# Patient Record
Sex: Male | Born: 1940 | Race: White | Hispanic: No | Marital: Married | Smoking: Former smoker
Health system: Southern US, Community
[De-identification: ages and names within clinical notes are randomized; demographics above are authoritative.]

## PROBLEM LIST (undated history)

## (undated) DIAGNOSIS — M5116 Intervertebral disc disorders with radiculopathy, lumbar region: Secondary | ICD-10-CM

## (undated) DIAGNOSIS — R7303 Prediabetes: Secondary | ICD-10-CM

## (undated) DIAGNOSIS — I10 Essential (primary) hypertension: Secondary | ICD-10-CM

## (undated) DIAGNOSIS — M48061 Spinal stenosis, lumbar region without neurogenic claudication: Secondary | ICD-10-CM

## (undated) DIAGNOSIS — M1990 Unspecified osteoarthritis, unspecified site: Secondary | ICD-10-CM

## (undated) DIAGNOSIS — Z87442 Personal history of urinary calculi: Secondary | ICD-10-CM

## (undated) DIAGNOSIS — R51 Headache: Secondary | ICD-10-CM

## (undated) DIAGNOSIS — K219 Gastro-esophageal reflux disease without esophagitis: Secondary | ICD-10-CM

## (undated) DIAGNOSIS — E785 Hyperlipidemia, unspecified: Secondary | ICD-10-CM

## (undated) DIAGNOSIS — M5415 Radiculopathy, thoracolumbar region: Secondary | ICD-10-CM

## (undated) DIAGNOSIS — G43009 Migraine without aura, not intractable, without status migrainosus: Secondary | ICD-10-CM

## (undated) DIAGNOSIS — N644 Mastodynia: Secondary | ICD-10-CM

## (undated) DIAGNOSIS — T7840XA Allergy, unspecified, initial encounter: Secondary | ICD-10-CM

## (undated) DIAGNOSIS — R011 Cardiac murmur, unspecified: Secondary | ICD-10-CM

## (undated) DIAGNOSIS — K760 Fatty (change of) liver, not elsewhere classified: Secondary | ICD-10-CM

## (undated) DIAGNOSIS — H269 Unspecified cataract: Secondary | ICD-10-CM

## (undated) DIAGNOSIS — Z85828 Personal history of other malignant neoplasm of skin: Secondary | ICD-10-CM

## (undated) DIAGNOSIS — R519 Headache, unspecified: Secondary | ICD-10-CM

## (undated) DIAGNOSIS — I2699 Other pulmonary embolism without acute cor pulmonale: Secondary | ICD-10-CM

## (undated) HISTORY — PX: THROAT SURGERY: SHX803

## (undated) HISTORY — DX: Headache, unspecified: R51.9

## (undated) HISTORY — DX: Headache: R51

## (undated) HISTORY — DX: Personal history of other malignant neoplasm of skin: Z85.828

## (undated) HISTORY — DX: Mastodynia: N64.4

## (undated) HISTORY — PX: JOINT REPLACEMENT: SHX530

## (undated) HISTORY — DX: Essential (primary) hypertension: I10

## (undated) HISTORY — DX: Fatty (change of) liver, not elsewhere classified: K76.0

## (undated) HISTORY — DX: Intervertebral disc disorders with radiculopathy, lumbar region: M51.16

## (undated) HISTORY — PX: OTHER SURGICAL HISTORY: SHX169

## (undated) HISTORY — DX: Migraine without aura, not intractable, without status migrainosus: G43.009

## (undated) HISTORY — DX: Cardiac murmur, unspecified: R01.1

## (undated) HISTORY — DX: Radiculopathy, thoracolumbar region: M54.15

## (undated) HISTORY — DX: Allergy, unspecified, initial encounter: T78.40XA

## (undated) HISTORY — PX: SPINE SURGERY: SHX786

## (undated) HISTORY — DX: Unspecified cataract: H26.9

## (undated) HISTORY — DX: Hyperlipidemia, unspecified: E78.5

---

## 1993-12-07 HISTORY — PX: NECK SURGERY: SHX720

## 1997-12-07 HISTORY — PX: CHOLECYSTECTOMY: SHX55

## 2003-12-08 HISTORY — PX: SHOULDER SURGERY: SHX246

## 2014-12-07 HISTORY — PX: GALLBLADDER SURGERY: SHX652

## 2014-12-07 HISTORY — PX: SHOULDER SURGERY: SHX246

## 2016-08-12 ENCOUNTER — Other Ambulatory Visit: Payer: Self-pay

## 2016-11-03 ENCOUNTER — Encounter: Payer: Self-pay | Admitting: Neurology

## 2016-11-03 ENCOUNTER — Telehealth: Payer: Self-pay | Admitting: Neurology

## 2016-11-03 ENCOUNTER — Ambulatory Visit (INDEPENDENT_AMBULATORY_CARE_PROVIDER_SITE_OTHER): Payer: Medicare Other | Admitting: Neurology

## 2016-11-03 VITALS — BP 132/76 | HR 65 | Ht 72.0 in | Wt 222.0 lb

## 2016-11-03 DIAGNOSIS — G4489 Other headache syndrome: Secondary | ICD-10-CM

## 2016-11-03 DIAGNOSIS — G44221 Chronic tension-type headache, intractable: Secondary | ICD-10-CM | POA: Diagnosis not present

## 2016-11-03 NOTE — Telephone Encounter (Signed)
Noted. Dr Lucia GaskinsAhern are you okay with this?

## 2016-11-03 NOTE — Telephone Encounter (Signed)
Sure. thanks 

## 2016-11-03 NOTE — Progress Notes (Addendum)
[facility]    Provider:  Dr Jaynee Eagles Referring Provider: [name], MD Primary Care Physician:  [name], MD  CC:  Headaches  HPI:  Jesse [name]. is a 75 y.o. male here as a referral from Dr. Tobie Poet for headaches. PMHx hld, htn, ED, pre-diabetes, fatty liver,  He has had headaches for 40+ years. He denies that they have recently changed, here with his wife who also provides much information. He has lived with headaches all his life.No vision changes, jaw claudication, neck stiffness or other associated symptoms. They thought it was allergies and sinuses in the past and he has been treated for this in the past. The last time he had a headache there was no associated sinus issue. Wife is here and provides information.Pain is just on the right side on the forehead and around the eye but it can spread. Not throbbing, not pounding, more pressure. It is "just there". Pressing on it makes it better, sitting in a chair and not moving helps, can hurt but no light sensitivity and can look at his phone. Worse after mowing the lawn. No light, sound, smell sensitivity. No nausea or vomiting. Headaches are continuous all day long for years continuous. Pain can be very dull or it can be severe and "just hurts". He takes tylenol and ibuprofen every day. Sometimes he takes OTC meds the morning and at dinner, multiple times a day. Most days he will take it every night and some mornings. He did not start topamax and not the steroids he was given. No inciting events, no other associated symptoms. He has been taking daily OTC medications for years. Continuous headaches correspond to daily OTC med use.   Reviewed notes, labs and imaging from outside physicians, which showed:  BUN 11, Creatinine 0.820 09/10/2016  Reviewed notes from pcp, patients reported headache started 3 months ago, bilateral frontal and behind the eyes, no radiation, severe, throbbing, dull and "sinus pressure" It improved  with sleep, taking antibiotics since September along with ibuprofen and tylenol daily which help. Toradol has been administered. Topamax and prednisone were prescribed for him. Sed rate was ordered.   Review of Systems: Patient complains of symptoms per HPI as well as the following symptoms: No CP, no SOB. Pertinent negatives per HPI. All others negative.   Social History   Social History  . Marital status: Married    Spouse name: [name]  . Number of children: 4  . Years of education: 12+   Occupational History  . Retired    Social History Main Topics  . Smoking status: Former Research scientist (life sciences)  . Smokeless tobacco: Never Used  . Alcohol use No  . Drug use: No  . Sexual activity: Not on file   Other Topics Concern  . Not on file   Social History Narrative   Lives with wife   Caffeine use: Drinks decaf coffee and tea    Family History  Problem Relation Age of Onset  . Hypertension Mother   . Cancer Father   . Diabetes Brother   . Migraines Neg Hx     Past Medical History:  Diagnosis Date  . Hypertension     Past Surgical History:  Procedure Laterality Date  . CHOLECYSTECTOMY  1999  . gallstones  2016  . NECK SURGERY  1995   Ruptured disc repair  . SHOULDER SURGERY Left 2005  . SHOULDER SURGERY Right 2016    Current Outpatient Prescriptions  Medication Sig Dispense Refill  . diltiazem (  CARDIZEM CD) 300 MG 24 hr capsule Take 300 mg by mouth daily.    Marland Kitchen lisinopril-hydrochlorothiazide (PRINZIDE,ZESTORETIC) 20-25 MG tablet Take 1 tablet by mouth daily.    Marland Kitchen omeprazole (PRILOSEC) 20 MG capsule Take 20 mg by mouth daily.     No current facility-administered medications for this visit.     Allergies as of 11/03/2016  . (No Known Allergies)    Vitals: BP 132/76 (BP Location: Right Arm, Patient Position: Sitting, Cuff Size: Normal)   Pulse 65   Ht 6' (1.829 m)   Wt 222 lb (100.7 kg)   BMI 30.11 kg/m  Last Weight:  Wt Readings from Last 1 Encounters:  11/03/16 222  lb (100.7 kg)   Last Height:   Ht Readings from Last 1 Encounters:  11/03/16 6' (1.829 m)    Physical exam: Exam: Gen: NAD, conversant, well nourised, obese, well groomed                     CV: RRR, no MRG. No Carotid Bruits. No peripheral edema, warm, nontender Eyes: Conjunctivae clear without exudates or hemorrhage  Neuro: Detailed Neurologic Exam  Speech:    Speech is normal; fluent and spontaneous with normal comprehension.  Cognition:    The patient is oriented to person, place, and time;     recent and remote memory intact;     language fluent;     normal attention, concentration,     fund of knowledge Cranial Nerves:    The pupils are equal, round, and reactive to light. Attempted fundoscopic exam could not visualize due to small pupils. . Visual fields are full to finger confrontation. Extraocular movements are intact. Trigeminal sensation is intact and the muscles of mastication are normal. The face is symmetric. The palate elevates in the midline. Hearing intact. Voice is normal. Shoulder shrug is normal. The tongue has normal motion without fasciculations.   Coordination:    No dysmetria  Gait:    Heel-toe and tandem gait are normal.   Motor Observation:    No asymmetry, no atrophy, and no involuntary movements noted. Tone:    Normal muscle tone.    Posture:    Posture is normal. normal erect    Strength:    Strength is V/V in the upper and lower limbs.      Sensation: intact to LT     Reflex Exam:  DTR's: Ansebt AJs otherwsie     Deep tendon reflexes in the upper and lower extremities are trace bilaterally.   Toes:    The toes are downgoing bilaterally.   Clonus:    Clonus is absent.      Assessment/Plan: Chronic daily headaches may be due to medication overuse. He has daily chronic headaches for over 40 years and takes daily tylenol and ibuprofen often multiple times a day.   I had a long discussion with patient that his daily use of ibuprofen  or Tylenol can cause medication overuse/rebound headache which may be causing chronic daily headaches. They only thing to do is to stop the medication unfortunately and see if this is the case. In the timeframe after stopping his headaches may get much worse. If this is rebound, he should significantly  improve with his chronic daily headaches after 2-4 weeks of being off daily over-the-counter medications. Do not use these medications more than 2 times in a week. I will see him very soon in followup, if headaches persist will evaluate for further treatment.  Remember  to drink plenty of fluid, eat healthy meals and do not skip any meals. Try to eat protein with a every meal and eat a healthy snack such as fruit or nuts in between meals. Try to keep a regular sleep-wake schedule and try to exercise daily, particularly in the form of walking, 20-30 minutes a day, if you can.   As far as your medications are concerned, I would like to suggest: - Stop daily over the counter med use (Tylenol, excedrin, ibuprofen, alleve) Do not tak emore than 2-3x in one week, provided literature on medication rebound/overuse - Start the prednisone you were prescribed. You can hold off on starting the topamax at this time - follow up in 6 weeks to see results, if headaches persist will evaluate for further treatment. - I highly recommended MRI brain, he initially agreed then called back to say he declines MRI - esr was drawn at pcp, will request result, also said he had a CT scan will request results  (Addendum report received from [facility] 10/02/2016: report states no acute intracranial abnormality. No other details on the brain parenchyma unfortunately specifically no mention of brain volume, white matter changes etc. Labs 09/20/15 CBC unremarkable, cmp also unremarkable bun 13, creat .7,hgba1c 5.7)  Discussed; To prevent or relieve headaches, try the following:  Cool Compress. Lie down and place a cool  compress on your head.   Avoid headache triggers. If certain foods or odors seem to have triggered your migraines in the past, avoid them. A headache diary might help you identify triggers.   Include physical activity in your daily routine. Try a daily walk or other moderate aerobic exercise.   Manage stress. Find healthy ways to cope with the stressors, such as delegating tasks on your to-do list.   Practice relaxation techniques. Try deep breathing, yoga, massage and visualization.   Eat regularly. Eating regularly scheduled meals and maintaining a healthy diet might help prevent headaches. Also, drink plenty of fluids.   Follow a regular sleep schedule. Sleep deprivation might contribute to headaches  Consider biofeedback. With this mind-body technique, you learn to control certain bodily functions - such as muscle tension, heart rate and blood pressure - to prevent headaches or reduce headache pain.    Proceed to emergency room if you experience new or worsening symptoms or symptoms do not resolve, if you have new neurologic symptoms or if headache is severe, or for any concerning symptom.  Cc: [name], MD   Sarina Ill, MD  [facility] [address] [place], [place] [address]  Phone [phone] Fax [phone]

## 2016-11-03 NOTE — Telephone Encounter (Signed)
Noted, thank you

## 2016-11-03 NOTE — Telephone Encounter (Signed)
Pt wants to wait until after 4-6 wk appt with Lucia GaskinsAhern to decide when to have mri due to ins change. cb

## 2016-11-03 NOTE — Patient Instructions (Signed)
Remember to drink plenty of fluid, eat healthy meals and do not skip any meals. Try to eat protein with a every meal and eat a healthy snack such as fruit or nuts in between meals. Try to keep a regular sleep-wake schedule and try to exercise daily, particularly in the form of walking, 20-30 minutes a day, if you can.   As far as your medications are concerned, I would like to suggest: Stop the daily tylenol and ibuprofen  As far as diagnostic testing: MRI of the brain  I would like to see you back in 4-6 weeks, sooner if we need to. Please call us with any interim questions, concerns, problems, updates or refill requests.   Our phone number is [phone]. We also have an after hours call service for urgent matters and there is a physician on-call for urgent questions. For any emergencies you know to call 911 or go to the nearest emergency room

## 2016-11-04 ENCOUNTER — Telehealth: Payer: Self-pay | Admitting: *Deleted

## 2016-11-04 ENCOUNTER — Encounter: Payer: Self-pay | Admitting: Neurology

## 2016-11-04 DIAGNOSIS — R51 Headache: Secondary | ICD-10-CM

## 2016-11-04 DIAGNOSIS — R519 Headache, unspecified: Secondary | ICD-10-CM | POA: Insufficient documentation

## 2016-11-04 NOTE — Telephone Encounter (Addendum)
Pt labs, imaging reports on LindseyEmma desk.

## 2016-12-15 ENCOUNTER — Ambulatory Visit: Payer: Medicare Other | Admitting: Neurology

## 2017-01-19 ENCOUNTER — Encounter: Payer: Self-pay | Admitting: Neurology

## 2017-01-19 ENCOUNTER — Ambulatory Visit (INDEPENDENT_AMBULATORY_CARE_PROVIDER_SITE_OTHER): Payer: Medicare Other | Admitting: Neurology

## 2017-01-19 VITALS — BP 113/62 | HR 67 | Ht 72.0 in | Wt 223.4 lb

## 2017-01-19 DIAGNOSIS — M316 Other giant cell arteritis: Secondary | ICD-10-CM

## 2017-01-19 DIAGNOSIS — R51 Headache: Secondary | ICD-10-CM

## 2017-01-19 DIAGNOSIS — R519 Headache, unspecified: Secondary | ICD-10-CM

## 2017-01-19 MED ORDER — PREDNISONE 50 MG PO TABS: 50.0000 mg | ORAL_TABLET | Freq: Every day | ORAL | 0 refills | Status: DC

## 2017-01-19 MED ORDER — TOPIRAMATE 25 MG PO TABS: 50.0000 mg | ORAL_TABLET | Freq: Every day | ORAL | 3 refills | Status: DC

## 2017-01-19 NOTE — Patient Instructions (Signed)
Remember to drink plenty of fluid, eat healthy meals and do not skip any meals. Try to eat protein with a every meal and eat a healthy snack such as fruit or nuts in between meals. Try to keep a regular sleep-wake schedule and try to exercise daily, particularly in the form of walking, 20-30 minutes a day, if you can.   As far as your medications are concerned, I would like to suggest:  - Topamax(Topiramate): start with one pill at night and then inrease to 2 pills (50mg ) in one week. In 3 weeks contact me and we may need to increase it further.  - Prednisone 50mg  in the morning with food for 5 days - If side effects to Topiramate discussed Depakote as an alternative  I would like to see you back in 6 months, sooner if we need to. Please call us with any interim questions, concerns, problems, updates or refill requests.    Our phone number is [phone]. We also have an after hours call service for urgent matters and there is a physician on-call for urgent questions. For any emergencies you know to call 911 or go to the nearest emergency room  Topiramate tablets What is this medicine? TOPIRAMATE (toe PYRE a mate) is used to treat seizures in adults or children with epilepsy. It is also used for the prevention of migraine headaches. This medicine may be used for other purposes; ask your health care provider or pharmacist if you have questions. COMMON BRAND NAME(S): Topamax, Topiragen What should I tell my health care provider before I take this medicine? They need to know if you have any of these conditions: -bleeding disorders -cirrhosis of the liver or liver disease -diarrhea -glaucoma -kidney stones or kidney disease -low blood counts, like low white cell, platelet, or red cell counts -lung disease like asthma, obstructive pulmonary disease, emphysema -metabolic acidosis -on a ketogenic diet -schedule for surgery or a procedure -suicidal thoughts, plans, or attempt; a previous suicide  attempt by you or a family member -an unusual or allergic reaction to topiramate, other medicines, foods, dyes, or preservatives -pregnant or trying to get pregnant -breast-feeding How should I use this medicine? Take this medicine by mouth with a glass of water. Follow the directions on the prescription label. Do not crush or chew. You may take this medicine with meals. Take your medicine at regular intervals. Do not take it more often than directed. Talk to your pediatrician regarding the use of this medicine in children. Special care may be needed. While this drug may be prescribed for children as young as 38 years of age for selected conditions, precautions do apply. Overdosage: If you think you have taken too much of this medicine contact a poison control center or emergency room at once. NOTE: This medicine is only for you. Do not share this medicine with others. What if I miss a dose? If you miss a dose, take it as soon as you can. If your next dose is to be taken in less than 6 hours, then do not take the missed dose. Take the next dose at your regular time. Do not take double or extra doses. What may interact with this medicine? Do not take this medicine with any of the following medications: -probenecid This medicine may also interact with the following medications: -acetazolamide -alcohol -amitriptyline -aspirin and aspirin-like medicines -birth control pills -certain medicines for depression -certain medicines for seizures -certain medicines that treat or prevent blood clots like warfarin, enoxaparin, dalteparin,  apixaban, dabigatran, and rivaroxaban -digoxin -hydrochlorothiazide -lithium -medicines for pain, sleep, or muscle relaxation -metformin -methazolamide -NSAIDS, medicines for pain and inflammation, like ibuprofen or naproxen -pioglitazone -risperidone This list may not describe all possible interactions. Give your health care provider a list of all the medicines,  herbs, non-prescription drugs, or dietary supplements you use. Also tell them if you smoke, drink alcohol, or use illegal drugs. Some items may interact with your medicine. What should I watch for while using this medicine? Visit your doctor or health care professional for regular checks on your progress. Do not stop taking this medicine suddenly. This increases the risk of seizures if you are using this medicine to control epilepsy. Wear a medical identification bracelet or chain to say you have epilepsy or seizures, and carry a card that lists all your medicines. This medicine can decrease sweating and increase your body temperature. Watch for signs of deceased sweating or fever, especially in children. Avoid extreme heat, hot baths, and saunas. Be careful about exercising, especially in hot weather. Contact your health care provider right away if you notice a fever or decrease in sweating. You should drink plenty of fluids while taking this medicine. If you have had kidney stones in the past, this will help to reduce your chances of forming kidney stones. If you have stomach pain, with nausea or vomiting and yellowing of your eyes or skin, call your doctor immediately. You may get drowsy, dizzy, or have blurred vision. Do not drive, use machinery, or do anything that needs mental alertness until you know how this medicine affects you. To reduce dizziness, do not sit or stand up quickly, especially if you are an older patient. Alcohol can increase drowsiness and dizziness. Avoid alcoholic drinks. If you notice blurred vision, eye pain, or other eye problems, seek medical attention at once for an eye exam. The use of this medicine may increase the chance of suicidal thoughts or actions. Pay special attention to how you are responding while on this medicine. Any worsening of mood, or thoughts of suicide or dying should be reported to your health care professional right away. This medicine may increase the  chance of developing metabolic acidosis. If left untreated, this can cause kidney stones, bone disease, or slowed growth in children. Symptoms include breathing fast, fatigue, loss of appetite, irregular heartbeat, or loss of consciousness. Call your doctor immediately if you experience any of these side effects. Also, tell your doctor about any surgery you plan on having while taking this medicine since this may increase your risk for metabolic acidosis. Birth control pills may not work properly while you are taking this medicine. Talk to your doctor about using an extra method of birth control. Women who become pregnant while using this medicine may enroll in the [place] Antiepileptic Drug Pregnancy Registry by calling [phone] [phone]. This registry collects information about the safety of antiepileptic drug use during pregnancy. What side effects may I notice from receiving this medicine? Side effects that you should report to your doctor or health care professional as soon as possible: -allergic reactions like skin rash, itching or hives, swelling of the face, lips, or tongue -decreased sweating and/or rise in body temperature -depression -difficulty breathing, fast or irregular breathing patterns -difficulty speaking -difficulty walking or controlling muscle movements -hearing impairment -redness, blistering, peeling or loosening of the skin, including inside the mouth -tingling, pain or numbness in the hands or feet -unusual bleeding or bruising -unusually weak or tired -worsening of mood, thoughts  or actions of suicide or dying Side effects that usually do not require medical attention (report to your doctor or health care professional if they continue or are bothersome): -altered taste -back pain, joint or muscle aches and pains -diarrhea, or constipation -headache -loss of appetite -nausea -stomach upset, indigestion -tremors This list may not describe all possible side  effects. Call your doctor for medical advice about side effects. You may report side effects to FDA at 1-800-FDA-1088. Where should I keep my medicine? Keep out of the reach of children. Store at room temperature between 15 and 30 degrees C (59 and 86 degrees F) in a tightly closed container. Protect from moisture. Throw away any unused medicine after the expiration date. NOTE: This sheet is a summary. It may not cover all possible information. If you have questions about this medicine, talk to your doctor, pharmacist, or health care provider.  2017 Elsevier/Gold Standard (2013-11-27 23:17:57)

## 2017-01-19 NOTE — Progress Notes (Signed)
[facility]   Provider:  Dr Jaynee Eagles Referring Provider: [name], MD Primary Care Physician:  [name], MD  CC:  Headaches  Interval history 01/19/2017:  Patient's headaches for 40+ years. Daily headache. Here with his wife. Stopping medication overuse did not help. He has not taken any daily ibuprofen or tylenol and still continues to have daily severe headaches. Discussed management of migraines and initiation of a daily preventative, choices. Also check esr and crp today given his right eye visual changes but denies any other symptoms of temporal arteritis and given the long-standing nature my suspicion is low.   HPI:  Jesse [name]. is a 76 y.o. male here as a referral from Dr. Tobie Poet for headaches. PMHx hld, htn, ED, pre-diabetes, fatty liver,  He has had headaches for 40+ years. He denies that they have recently changed, here with his wife who also provides much information. He has lived with headaches all his life.No vision changes, jaw claudication, neck stiffness or other associated symptoms. They thought it was allergies and sinuses in the past and he has been treated for this in the past. The last time he had a headache there was no associated sinus issue. Wife is here and provides information.Pain is just on the right side on the forehead and around the eye but it can spread. Not throbbing, not pounding, more pressure. It is "just there". Pressing on it makes it better, sitting in a chair and not moving helps, can hurt but no light sensitivity and can look at his phone. Worse after mowing the lawn. No light, sound, smell sensitivity. No nausea or vomiting. Headaches are continuous all day long for years continuous. Pain can be very dull or it can be severe and "just hurts". He takes tylenol and ibuprofen every day. Sometimes he takes OTC meds the morning and at dinner, multiple times a day. Most days he will take it every night and some mornings. He did not start  topamax and not the steroids he was given. No inciting events, no other associated symptoms. He has been taking daily OTC medications for years. Continuous headaches correspond to daily OTC med use.   Reviewed notes, labs and imaging from outside physicians, which showed:  BUN 11, Creatinine 0.820 09/10/2016  Reviewed notes from pcp, patients reported headache started 3 months ago, bilateral frontal and behind the eyes, no radiation, severe, throbbing, dull and "sinus pressure" It improved with sleep, taking antibiotics since September along with ibuprofen and tylenol daily which help. Toradol has been administered. Topamax and prednisone were prescribed for him. Sed rate was ordered.   Review of Systems: Patient complains of symptoms per HPI as well as the following symptoms: No CP, no SOB. Pertinent negatives per HPI. All others negative.   Social History   Social History  . Marital status: Married    Spouse name: Jesse [name]  . Number of children: 4  . Years of education: 12+   Occupational History  . Retired    Social History Main Topics  . Smoking status: Former Research scientist (life sciences)  . Smokeless tobacco: Never Used  . Alcohol use No  . Drug use: No  . Sexual activity: Not on file   Other Topics Concern  . Not on file   Social History Narrative   Lives with wife   Caffeine use: Drinks decaf coffee and tea   Writes right-handed, eats and shaves left-handed    Family History  Problem Relation Age of Onset  . Hypertension Mother   .  Cancer Father   . Diabetes Brother   . Migraines Neg Hx     Past Medical History:  Diagnosis Date  . Fatty liver   . Headache   . Hyperlipidemia   . Hypertension     Past Surgical History:  Procedure Laterality Date  . CHOLECYSTECTOMY  1999  . GALLBLADDER SURGERY  2016  . NECK SURGERY  1995   Ruptured disc repair  . SHOULDER SURGERY Left 2005  . SHOULDER SURGERY Right 2016    Current Outpatient Prescriptions  Medication Sig Dispense  Refill  . aspirin 81 MG tablet Take 81 mg by mouth daily.    Marland Kitchen diltiazem (CARDIZEM CD) 300 MG 24 hr capsule Take 300 mg by mouth daily.    . Ginger, Zingiber officinalis, (GINGER PO) Take by mouth daily.    . Glucosamine HCl (GLUCOSAMINE PO) Take by mouth daily.    Marland Kitchen lisinopril-hydrochlorothiazide (PRINZIDE,ZESTORETIC) 20-25 MG tablet Take 1 tablet by mouth daily.    . OMEGA-3 FATTY ACIDS PO Take by mouth daily.    Marland Kitchen omeprazole (PRILOSEC) 20 MG capsule Take 20 mg by mouth daily.    . TURMERIC PO Take by mouth daily.    . predniSONE (DELTASONE) 50 MG tablet Take 1 tablet (50 mg total) by mouth daily with breakfast. 5 tablet 0  . topiramate (TOPAMAX) 25 MG tablet Take 2 tablets (50 mg total) by mouth at bedtime. 60 tablet 3   No current facility-administered medications for this visit.     Allergies as of 01/19/2017  . (No Known Allergies)    Vitals: BP 113/62   Pulse 67   Ht 6' (1.829 m)   Wt 223 lb 6.4 oz (101.3 kg)   BMI 30.30 kg/m  Last Weight:  Wt Readings from Last 1 Encounters:  01/19/17 223 lb 6.4 oz (101.3 kg)   Last Height:   Ht Readings from Last 1 Encounters:  01/19/17 6' (1.829 m)        Assessment/Plan: Chronic daily headaches for 40 years not improved after stopping OTC medication overuse.   Remember to drink plenty of fluid, eat healthy meals and do not skip any meals. Try to eat protein with a every meal and eat a healthy snack such as fruit or nuts in between meals. Try to keep a regular sleep-wake schedule and try to exercise daily, particularly in the form of walking, 20-30 minutes a day, if you can.   As far as your medications are concerned, I would like to suggest:  - Topamax(Topiramate): start with one pill at night and then inrease to 2 pills (9m) in one week. In 3 weeks contact me and we may need to increase it further.  - Prednisone 576min the morning with food for 5 days - If side effects to Topiramate discussed Depakote as an alternative  I  would like to see you back in 6 months, sooner if we need to. Please call usKoreaith any interim questions, concerns, problems, updates or refill requests.    ([facility] report states no acute intracranial abnormality. No other details on the brain parenchyma unfortunately specifically no mention of brain volume, white matter changes etc. Labs 09/20/15 CBC unremarkable, cmp also unremarkable bun 13, creat .7,hgba1c 5.7)  Discussed; To prevent or relieve headaches, try the following:  Cool Compress.Lie down and place a cool compress on your head.   Avoid headache triggers.If certain foods or odors seem to have triggered your migraines in  the past, avoid them. A headache diary might help you identify triggers.   Include physical activity in your daily routine.Try a daily walk or other moderate aerobic exercise.   Manage stress.Find healthy ways to cope with the stressors, such as delegating tasks on your to-do list.   Practice relaxation techniques.Try deep breathing, yoga, massage and visualization.   Eat regularly.Eating regularly scheduled meals and maintaining a healthy diet might help prevent headaches. Also, drink plenty of fluids.   Follow a regular sleep schedule.Sleep deprivation might contribute to headaches  Consider biofeedback.With this mind-body technique, you learn to control certain bodily functions -such as muscle tension, heart rate and blood pressure -to prevent headaches or reduce headache pain.    Proceed to emergency room if you experience new or worsening symptoms or symptoms do not resolve, if you have new neurologic symptoms or if headache is severe, or for any concerning symptom.  Cc: [name], MD  Sarina Ill, MD  [facility] [address] [place], [place] [address]  Phone [phone] Fax [phone]  A total of 30 minutes was spent face-to-face with this  patient. Over half this time was spent on counseling patient on the  Chronic daily headache diagnosis and different diagnostic and therapeutic options available.

## 2017-01-20 ENCOUNTER — Telehealth: Payer: Self-pay | Admitting: *Deleted

## 2017-01-20 LAB — SEDIMENTATION RATE: Sed Rate: 9 mm/hr (ref 0–30)

## 2017-01-20 LAB — C-REACTIVE PROTEIN: CRP: 5.8 mg/L — ABNORMAL HIGH (ref 0.0–4.9)

## 2017-01-20 NOTE — Telephone Encounter (Signed)
I have spoken with Jesse [name] this afternoon and per AA, explained that labs look ok.  She verbalized understanding of same/fim

## 2017-01-20 NOTE — Telephone Encounter (Signed)
-----   Message from Anson FretAntonia B Ahern, MD sent at 01/20/2017  2:23 PM EST ----- Labs look fine

## 2017-02-11 ENCOUNTER — Encounter: Payer: Self-pay | Admitting: Neurology

## 2017-02-18 MED ORDER — TOPIRAMATE 100 MG PO TABS: 100.0000 mg | ORAL_TABLET | Freq: Every day | ORAL | 11 refills | Status: DC

## 2017-02-18 NOTE — Addendum Note (Signed)
Addended by: Donnelly AngelicaHOGAN, Loriann Bosserman L on: 02/18/2017 11:40 AM   Modules accepted: Orders

## 2017-08-02 ENCOUNTER — Encounter: Payer: Self-pay | Admitting: Neurology

## 2017-08-02 ENCOUNTER — Ambulatory Visit (INDEPENDENT_AMBULATORY_CARE_PROVIDER_SITE_OTHER): Payer: Medicare Other | Admitting: Neurology

## 2017-08-02 VITALS — BP 148/75 | HR 63 | Ht 72.0 in | Wt 208.8 lb

## 2017-08-02 DIAGNOSIS — G43709 Chronic migraine without aura, not intractable, without status migrainosus: Secondary | ICD-10-CM

## 2017-08-02 DIAGNOSIS — I10 Essential (primary) hypertension: Secondary | ICD-10-CM

## 2017-08-02 MED ORDER — CANDESARTAN CILEXETIL 4 MG PO TABS: 4.0000 mg | ORAL_TABLET | Freq: Every day | ORAL | 6 refills | Status: DC

## 2017-08-02 MED ORDER — SUMATRIPTAN SUCCINATE 100 MG PO TABS: 100.0000 mg | ORAL_TABLET | Freq: Once | ORAL | 12 refills | Status: DC | PRN

## 2017-08-02 NOTE — Patient Instructions (Addendum)
Remember to drink plenty of fluid, eat healthy meals and do not skip any meals. Try to eat protein with a every meal and eat a healthy snack such as fruit or nuts in between meals. Try to keep a regular sleep-wake schedule and try to exercise daily, particularly in the form of walking, 20-30 minutes a day, if you can.   As far as your medications are concerned, I would like to suggest:  c Please also call us for any test results so we can go over those with you on the phone.  My clinical assistant and will answer any of your questions and relay your messages to me and also relay most of my messages to you.   Our phone number is [phone]. We also have an after hours call service for urgent matters and there is a physician on-call for urgent questions. For any emergencies you know to call 911 or go to the nearest emergency room  Candesartan tablets What is this medicine? CANDESARTAN (kan des AR tan) is used to treat high blood pressure in children and adults. This drug is also used to treat adults with heart failure. This medicine may be used for other purposes; ask your health care provider or pharmacist if you have questions. COMMON BRAND NAME(S): Atacand What should I tell my health care provider before I take this medicine? They need to know if you have any of these conditions: -heart failure -kidney or liver disease -if you are on a special diet, such as a low salt diet -an unusual or allergic reaction to candesartan, other medicines, foods, dyes, or preservatives -pregnant or trying to get pregnant -breast-feeding How should I use this medicine? Take this medicine by mouth with a glass of water. Follow the directions on the prescription label. This medicine can be taken with or without food. Take your doses at regular intervals. Do not take your medicine more often than directed. Talk to your pediatrician regarding the use of this medicine in children. While this drug may be  prescribed for children as young as 1 year for selected conditions, precautions do apply. For children who are unable to swallow tablets, the pharmacist can prepare the medicine in an oral suspension. Overdosage: If you think you have taken too much of this medicine contact a poison control center or emergency room at once. NOTE: This medicine is only for you. Do not share this medicine with others. What if I miss a dose? If you miss a dose, take it as soon as you can. If it is almost time for your next dose, take only that dose. Do not take double or extra doses. What may interact with this medicine? -blood pressure medicines -diuretics, especially triamterene, spironolactone, or amiloride -lithium -NSAIDs, medicines for pain and inflammation, like ibuprofen or naproxen -potassium salts or potassium supplements This list may not describe all possible interactions. Give your health care provider a list of all the medicines, herbs, non-prescription drugs, or dietary supplements you use. Also tell them if you smoke, drink alcohol, or use illegal drugs. Some items may interact with your medicine. What should I watch for while using this medicine? Visit your doctor or health care professional for regular checks on your progress. Check your blood pressure as directed. Ask your doctor or health care professional what your blood pressure should be and when you should contact him or her. Call your doctor or health care professional if you notice an irregular or fast heart beat. Women should inform  their doctor if they wish to become pregnant or think they might be pregnant. There is a potential for serious side effects to an unborn child, particularly in the second or third trimester. Talk to your health care professional or pharmacist for more information. You may get drowsy or dizzy. Do not drive, use machinery, or do anything that needs mental alertness until you know how this drug affects you. Do not  stand or sit up quickly, especially if you are an older patient. This reduces the risk of dizzy or fainting spells. Avoid salt substitutes unless you are told otherwise by your doctor or health care professional. Do not treat yourself for coughs, colds, or pain while you are taking this medicine without asking your doctor or health care professional for advice. Some ingredients may increase your blood pressure. What side effects may I notice from receiving this medicine? Side effects that you should report to your doctor or health care professional as soon as possible: -allergic reactions like skin rash, itching or hives, swelling of the face, lips, or tongue -breathing problems -chest pain -decreased amount of urine passed -fast or irregular heart beat -feeling faint or lightheaded, falls -swelling of your hands or feet Side effects that usually do not require medical attention (report to your doctor or health care professional if they continue or are bothersome): -change in sex drive or performance -cough -headache -nausea or stomach pain This list may not describe all possible side effects. Call your doctor for medical advice about side effects. You may report side effects to FDA at 1-800-FDA-1088. Where should I keep my medicine? Keep out of the reach of children. Store at room temperature between 15 and 30 degrees C (59 and 86 degrees F). Protect from light. Keep container tightly closed. Throw away any unused medicine after the expiration date. NOTE: This sheet is a summary. It may not cover all possible information. If you have questions about this medicine, talk to your doctor, pharmacist, or health care provider.  2018 Elsevier/Gold Standard (2008-10-02 10:07:40)

## 2017-08-02 NOTE — Progress Notes (Signed)
[facility]    Provider:  Dr Jaynee Eagles Referring Provider: [name], MD Primary Care Physician:  [name], MD  CC: Headaches  Interval history 08/02/2017: he has been better recently. Was having daily headaches, now having 15 headache days a month and 10 are migrainous. .Pain is just on the right side on the forehead and around the eye but it can spread. Throbbing, pounding, more pressure. It is "just there". Pressing on it makes it better, sitting in a chair and not moving helps, can hurt and has light sensitivity, no nausea. They can up to 24 hours a day. They can be a 7-8/10 in pain. Right now his headache is at a 5/10.    Interval history 01/19/2017:  Patient's headaches for 40+ years. Daily headache. Here with his wife. Stopping medication overuse did not help. He has not taken any daily ibuprofen or tylenol and still continues to have daily severe headaches. Discussed management of migraines and initiation of a daily preventative, choices. Also check esr and crp today given his right eye visual changes but denies any other symptoms of temporal arteritis and given the long-standing nature my suspicion is low.   DGL:OVFIEPP Jesse Daughertyis a 76 y.o.malehere as a referral from Dr. Trey Sailors headaches. PMHx hld, htn, ED, pre-diabetes, fatty liver, He has had headaches for 40+ years. He denies that they have recently changed, here with his wife who also provides much information.He has lived with headaches all his life.No vision changes, jaw claudication, neck stiffness or other associated symptoms.They thought it was allergies and sinuses in the past and he has been treated for this in the past. The last time he had a headache there was no associated sinus issue. Wife is here and provides information.Pain is just on the right side on the forehead and around the eye but it can spread. Not throbbing, not pounding, more pressure. It is "just there". Pressing on it makes it  better, sitting in a chair and not moving helps, can hurt but no light sensitivity and can look at his phone. Worse after mowing the lawn. No light, sound, smell sensitivity. No nausea or vomiting. Headaches are continuous all day long for years continuous. Pain can be very dull or it can be severe and "just hurts". He takes tylenol and ibuprofen every day. Sometimes he takes OTC meds the morning and at dinner, multiple times a day. Most days he will take it every night and some mornings. He did not start topamax and not the steroids he was given. No inciting events, no other associated symptoms. He has been taking daily OTC medications for years. Continuous headaches correspond to daily OTC med use.   Reviewed notes, labs and imaging from outside physicians, which showed:  BUN 11, Creatinine 0.820 09/10/2016  Reviewed notes from pcp, patients reported headache started 3 months ago, bilateral frontal and behind the eyes, no radiation, severe, throbbing, dull and "sinus pressure" It improved with sleep, taking antibiotics since September along with ibuprofen and tylenol daily which help. Toradol has been administered. Topamax and prednisone were prescribed for him. Sed rate was ordered.   Review of Systems: Patient complains of symptoms per HPI as well as the following symptoms: No CP, no SOB. Pertinent negatives per HPI. All others negative.   Social History   Social History  . Marital status: Married    Spouse name: Jesse [name]  . Number of children: 4  . Years of education: 12+   Occupational History  .  Retired    Social History Main Topics  . Smoking status: Former Research scientist (life sciences)  . Smokeless tobacco: Never Used  . Alcohol use No  . Drug use: No  . Sexual activity: Not on file   Other Topics Concern  . Not on file   Social History Narrative   Lives with wife   Caffeine use: Drinks decaf coffee and tea   Writes right-handed, eats and shaves left-handed    Family History  Problem  Relation Age of Onset  . Hypertension Mother   . Cancer Father   . Diabetes Brother   . Migraines Neg Hx     Past Medical History:  Diagnosis Date  . Fatty liver   . Headache   . Hyperlipidemia   . Hypertension     Past Surgical History:  Procedure Laterality Date  . CHOLECYSTECTOMY  1999  . GALLBLADDER SURGERY  2016  . NECK SURGERY  1995   Ruptured disc repair  . SHOULDER SURGERY Left 2005  . SHOULDER SURGERY Right 2016    Current Outpatient Prescriptions  Medication Sig Dispense Refill  . aspirin 81 MG tablet Take 81 mg by mouth daily.    Marland Kitchen diltiazem (CARDIZEM CD) 300 MG 24 hr capsule Take 300 mg by mouth daily.    . Ginger, Zingiber officinalis, (GINGER PO) Take by mouth daily.    . Glucosamine HCl (GLUCOSAMINE PO) Take by mouth daily.    Marland Kitchen lisinopril-hydrochlorothiazide (PRINZIDE,ZESTORETIC) 20-25 MG tablet Take 1 tablet by mouth daily.    . OMEGA-3 FATTY ACIDS PO Take by mouth daily.    Marland Kitchen omeprazole (PRILOSEC) 20 MG capsule Take 20 mg by mouth daily.    Marland Kitchen topiramate (TOPAMAX) 100 MG tablet Take 1 tablet (100 mg total) by mouth at bedtime. 30 tablet 11   No current facility-administered medications for this visit.     Allergies as of 08/02/2017  . (No Known Allergies)    Vitals: BP (!) 193/87 (BP Location: Right Arm, Patient Position: Sitting, Cuff Size: Normal)   Pulse 64   Ht 6' (1.829 m)   Wt 208 lb 12.8 oz (94.7 kg)   BMI 28.32 kg/m  Last Weight:  Wt Readings from Last 1 Encounters:  08/02/17 208 lb 12.8 oz (94.7 kg)   Last Height:   Ht Readings from Last 1 Encounters:  08/02/17 6' (1.829 m)   Physical exam: Exam: Gen: NAD, conversant, well nourised, obese, well groomed                     CV: RRR, no MRG. No Carotid Bruits. No peripheral edema, warm, nontender Eyes: Conjunctivae clear without exudates or hemorrhage  Neuro: Detailed Neurologic Exam  Speech:    Speech is normal; fluent and spontaneous with normal comprehension.  Cognition:     The patient is oriented to person, place, and time;     recent and remote memory intact;     language fluent;     normal attention, concentration,     fund of knowledge Cranial Nerves:    The pupils are equal, round, and reactive to light. The fundi are normal and spontaneous venous pulsations are present. Visual fields are full to finger confrontation. Extraocular movements are intact. Trigeminal sensation is intact and the muscles of mastication are normal. The face is symmetric. The palate elevates in the midline. Hearing intact. Voice is normal. Shoulder shrug is normal. The tongue has normal motion without fasciculations.   Coordination:    Normal finger  to nose and heel to shin. Normal rapid alternating movements.   Gait:    Heel-toe and tandem gait are normal.   Motor Observation:    No asymmetry, no atrophy, and no involuntary movements noted. Tone:    Normal muscle tone.    Posture:    Posture is normal. normal erect    Strength:    Strength is V/V in the upper and lower limbs.         Assessment/Plan:Chronic daily headaches for 40 years, now improved to 15 a month since addressing his medication overuse. 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   Remember to drink plenty of fluid, eat healthy meals and do not skip any meals. Try to eat protein with a every meal and eat a healthy snack such as fruit or nuts in between meals. Try to keep a regular sleep-wake schedule and try to exercise daily, particularly in the form of walking, 20-30 minutes a day, if you can.   As far as your medications are concerned, I would like to suggest:  - Topamax(Topiramate): continue - If side effects to Topiramate discussed Depakote as an alternative - Start Candesartan '4mg'$  daily as a daily preventative also will help with elevated BP. Will call in 2-3 weeks and increase to '8mg'$  if no side effects Daily migraine headache calendar At onset of headache,  take Sumatriptan. Please take one tablet at the onset of your headache. If it does not improve the symptoms please take one additional tablet in 2 hours. Do not take more then 2 tablets in 24hrs. Do not take use more then 2 to 3 times in a week.  I would like to see you back in 3 months, sooner if we need to. Please call us with any interim questions, concerns, problems, updates or refill requests.    I would like to see you back in 6 months, sooner if we need to. Please call us with any interim questions, concerns, problems, updates or refill requests.    (Addendum report received from [facility] 10/02/2016: report states no acute intracranial abnormality. No other details on the brain parenchyma unfortunately specifically no mention of brain volume, white matter changes etc. Labs 09/20/15 CBC unremarkable, cmp also unremarkable bun 13, creat .7,hgba1c 5.7)   Sarina Ill, MD  [facility] [address] [place], [place] [address]  Phone [phone] Fax [phone]  A total of 25 minutes was spent face-to-face with this patient. Over half this time was spent on counseling patient on the chronic migraine diagnosis and different diagnostic and therapeutic options available.

## 2017-08-03 ENCOUNTER — Telehealth: Payer: Self-pay | Admitting: *Deleted

## 2017-08-03 DIAGNOSIS — G43009 Migraine without aura, not intractable, without status migrainosus: Secondary | ICD-10-CM | POA: Insufficient documentation

## 2017-08-03 LAB — CBC
HEMATOCRIT: 44.8 % (ref 37.5–51.0)
Hemoglobin: 14.6 g/dL (ref 13.0–17.7)
MCH: 28 pg (ref 26.6–33.0)
MCHC: 32.6 g/dL (ref 31.5–35.7)
MCV: 86 fL (ref 79–97)
PLATELETS: 159 10*3/uL (ref 150–379)
RBC: 5.22 x10E6/uL (ref 4.14–5.80)
RDW: 14.5 % (ref 12.3–15.4)
WBC: 5 10*3/uL (ref 3.4–10.8)

## 2017-08-03 LAB — COMPREHENSIVE METABOLIC PANEL
ALK PHOS: 105 IU/L (ref 39–117)
ALT: 32 IU/L (ref 0–44)
AST: 26 IU/L (ref 0–40)
Albumin/Globulin Ratio: 1.7 (ref 1.2–2.2)
Albumin: 4.3 g/dL (ref 3.5–4.8)
BILIRUBIN TOTAL: 0.4 mg/dL (ref 0.0–1.2)
BUN / CREAT RATIO: 14 (ref 10–24)
BUN: 12 mg/dL (ref 8–27)
CHLORIDE: 105 mmol/L (ref 96–106)
CO2: 25 mmol/L (ref 20–29)
CREATININE: 0.87 mg/dL (ref 0.76–1.27)
Calcium: 9.7 mg/dL (ref 8.6–10.2)
GFR calc Af Amer: 97 mL/min/{1.73_m2} (ref >59)
GFR calc non Af Amer: 84 mL/min/{1.73_m2} (ref >59)
GLUCOSE: 89 mg/dL (ref 65–99)
Globulin, Total: 2.6 g/dL (ref 1.5–4.5)
Potassium: 4.2 mmol/L (ref 3.5–5.2)
SODIUM: 143 mmol/L (ref 134–144)
Total Protein: 6.9 g/dL (ref 6.0–8.5)

## 2017-08-03 NOTE — Telephone Encounter (Signed)
Called and LVM for pt about normal labs per AA,MD note. Gave [facility] phone number if he has further questions/concerns.

## 2017-08-03 NOTE — Telephone Encounter (Signed)
-----   Message from Antonia B Ahern, MD sent at 08/03/2017  8:53 AM EDT ----- Labs normal 

## 2017-08-26 ENCOUNTER — Telehealth: Payer: Self-pay | Admitting: Neurology

## 2017-08-26 NOTE — Telephone Encounter (Signed)
LMVM for pt to return call, checking on how he is doing relating to his BP and headaches. We are here till 1200 tomorrow on Friday.

## 2017-08-26 NOTE — Telephone Encounter (Signed)
Sandy, I started patient on medication for headache and blood pressure. Can you call and see how he is doing? If no side effects I would like to increase the candesartan to  a day. Please ask how his BP and headaches doing thanks.

## 2017-08-30 NOTE — Telephone Encounter (Signed)
If his BP is 115-120 I don;t want to increase thanks, we will see how it goes at this dose

## 2017-08-30 NOTE — Telephone Encounter (Signed)
Spoke to pt and he stated that he is the same relating to his headaches.  His bp runs 110-115 sys at [facility], and higher at home in 130's.   He has had no SE.  I relayed that Dr. Lucia Gaskins may increase to  and see if will help with headaches and he was ok to try if this is what she wants to do.

## 2017-08-31 NOTE — Telephone Encounter (Signed)
LMVM for pt that will leave things as they are.  NO change in medication dose.  He is to call back if questions.

## 2017-11-02 ENCOUNTER — Encounter: Payer: Self-pay | Admitting: Neurology

## 2017-11-02 ENCOUNTER — Ambulatory Visit (INDEPENDENT_AMBULATORY_CARE_PROVIDER_SITE_OTHER): Payer: Medicare Other | Admitting: Neurology

## 2017-11-02 VITALS — BP 124/70 | HR 66 | Ht 72.0 in | Wt 209.4 lb

## 2017-11-02 DIAGNOSIS — G43709 Chronic migraine without aura, not intractable, without status migrainosus: Secondary | ICD-10-CM

## 2017-11-02 MED ORDER — TOPIRAMATE 100 MG PO TABS: 100.0000 mg | ORAL_TABLET | Freq: Every day | ORAL | 4 refills | Status: DC

## 2017-11-02 NOTE — Patient Instructions (Signed)
Propranolol extended-release capsules What is this medicine? PROPRANOLOL (proe PRAN oh lole) is a beta-blocker. Beta-blockers reduce the workload on the heart and help it to beat more regularly. This medicine is used to treat high blood pressure, heart muscle disease, and prevent chest pain caused by angina. It is also used to prevent migraine headaches. You should not use this medicine to treat a migraine that has already started. This medicine may be used for other purposes; ask your health care provider or pharmacist if you have questions. COMMON BRAND NAME(S): Inderal LA, Inderal XL, InnoPran XL What should I tell my health care provider before I take this medicine? They need to know if you have any of these conditions: -circulation problems, or blood vessel disease -diabetes -history of heart attack or heart disease, vasospastic angina -kidney disease -liver disease -lung or breathing disease, like asthma or emphysema -pheochromocytoma -slow heart rate -thyroid disease -an unusual or allergic reaction to propranolol, other beta-blockers, medicines, foods, dyes, or preservatives -pregnant or trying to get pregnant -breast-feeding How should I use this medicine? Take this medicine by mouth with a glass of water. Follow the directions on the prescription label. Do not crush or chew. Take your doses at regular intervals. Do not take your medicine more often than directed. Do not stop taking except on the advice of your doctor or health care professional. Talk to your pediatrician regarding the use of this medicine in children. Special care may be needed. Overdosage: If you think you have taken too much of this medicine contact a poison control center or emergency room at once. NOTE: This medicine is only for you. Do not share this medicine with others. What if I miss a dose? If you miss a dose, take it as soon as you can. If it is almost time for your next dose, take only that dose. Do not  take double or extra doses. What may interact with this medicine? Do not take this medicine with any of the following medications: -feverfew -phenothiazines like chlorpromazine, mesoridazine, prochlorperazine, thioridazine This medicine may also interact with the following medications: -aluminum hydroxide gel -antipyrine -antiviral medicines for HIV or AIDS -barbiturates like phenobarbital -certain medicines for blood pressure, heart disease, irregular heart beat -cimetidine -ciprofloxacin -diazepam -fluconazole -haloperidol -isoniazid -medicines for cholesterol like cholestyramine or colestipol -medicines for mental depression -medicines for migraine headache like almotriptan, eletriptan, frovatriptan, naratriptan, rizatriptan, sumatriptan, zolmitriptan -NSAIDs, medicines for pain and inflammation, like ibuprofen or naproxen -phenytoin -rifampin -teniposide -theophylline -thyroid medicines -tolbutamide -warfarin -zileuton This list may not describe all possible interactions. Give your health care provider a list of all the medicines, herbs, non-prescription drugs, or dietary supplements you use. Also tell them if you smoke, drink alcohol, or use illegal drugs. Some items may interact with your medicine. What should I watch for while using this medicine? Visit your doctor or health care professional for regular check ups. Contact your doctor right away if your symptoms worsen. Check your blood pressure and pulse rate regularly. Ask your health care professional what your blood pressure and pulse rate should be, and when you should contact them. Do not stop taking this medicine suddenly. This could lead to serious heart-related effects. You may get drowsy or dizzy. Do not drive, use machinery, or do anything that needs mental alertness until you know how this drug affects you. Do not stand or sit up quickly, especially if you are an older patient. This reduces the risk of dizzy or  fainting spells. Alcohol can   make you more drowsy and dizzy. Avoid alcoholic drinks. This medicine can affect blood sugar levels. If you have diabetes, check with your doctor or health care professional before you change your diet or the dose of your diabetic medicine. Do not treat yourself for coughs, colds, or pain while you are taking this medicine without asking your doctor or health care professional for advice. Some ingredients may increase your blood pressure. What side effects may I notice from receiving this medicine? Side effects that you should report to your doctor or health care professional as soon as possible: -allergic reactions like skin rash, itching or hives, swelling of the face, lips, or tongue -breathing problems -changes in blood sugar -cold hands or feet -difficulty sleeping, nightmares -dry peeling skin -hallucinations -muscle cramps or weakness -slow heart rate -swelling of the legs and ankles -vomiting Side effects that usually do not require medical attention (report to your doctor or health care professional if they continue or are bothersome): -change in sex drive or performance -diarrhea -dry sore eyes -hair loss -nausea -weak or tired This list may not describe all possible side effects. Call your doctor for medical advice about side effects. You may report side effects to FDA at 1-800-FDA-1088. Where should I keep my medicine? Keep out of the reach of children. Store at room temperature between 15 and 30 degrees C (59 and 86 degrees F). Protect from light, moisture and freezing. Keep container tightly closed. Throw away any unused medicine after the expiration date. NOTE: This sheet is a summary. It may not cover all possible information. If you have questions about this medicine, talk to your doctor, pharmacist, or health care provider.  2018 Elsevier/Gold Standard (2013-07-28 14:58:56)  

## 2017-11-02 NOTE — Progress Notes (Signed)
[facility]    Provider:  Dr Jesse [name] Referring Provider: [name], MD Primary Care Physician:  Jesse Brome, MD  CC: Headaches  Interval history 11/02/2017:  Half the month of headaches 1-2. Still on the Topamax and Candesartan. 2-3 days a month are migrainous. He tried the imitrex. Candesartan expensive, discussed other medications such as propranolol and Ajovy.   Medications tried: Topiramate, Candesartan.  Interval history 08/02/2017: he has been better recently. Was having daily headaches, now having 15 headache days a month and 10 are migrainous. .Pain is just on the right side on the forehead and around the eye but it can spread. Throbbing, pounding, more pressure. It is "just there". Pressing on it makes it better, sitting in a chair and not moving helps, can hurt and has light sensitivity, no nausea. They can up to 24 hours a day. They can be a 7-8/10 in pain. Right now his headache is at a 5/10.   Medications tried: Topiramate, Candesartan, prednisone, Imitrex, diltiazem, lisinopril, tumeric  Interval history 01/19/2017:  Patient's headaches for 40+ years. Daily headache. Here with his wife. Stopping medication overuse did not help. He has not taken any daily ibuprofen or tylenol and still continues to have daily severe headaches. Discussed management of migraines and initiation of a daily preventative, choices. Also check esr and crp today given his right eye visual changes but denies any other symptoms of temporal arteritis and given the long-standing nature my suspicion is low.   Jesse Daughertyis a 76 y.o.malehere as a referral from Dr. Trey [name] headaches. PMHx hld, htn, ED, pre-diabetes, fatty liver, He has had headaches for 40+ years. He denies that they have recently changed, here with his wife who also provides much information.He has lived with headaches all his life.No vision changes, jaw claudication, neck stiffness or other associated  symptoms.They thought it was allergies and sinuses in the past and he has been treated for this in the past. The last time he had a headache there was no associated sinus issue. Wife is here and provides information.Pain is just on the right side on the forehead and around the eye but it can spread. Not throbbing, not pounding, more pressure. It is "just there". Pressing on it makes it better, sitting in a chair and not moving helps, can hurt but no light sensitivity and can look at his phone. Worse after mowing the lawn. No light, sound, smell sensitivity. No nausea or vomiting. Headaches are continuous all day long for years continuous. Pain can be very dull or it can be severe and "just hurts". He takes tylenol and ibuprofen every day. Sometimes he takes OTC meds the morning and at dinner, multiple times a day. Most days he will take it every night and some mornings. He did not start topamax and not the steroids he was given. No inciting events, no other associated symptoms. He has been taking daily OTC medications for years. Continuous headaches correspond to daily OTC med use.   Reviewed notes, labs and imaging from outside physicians, which showed:  BUN 11, Creatinine 0.820 09/10/2016  Reviewed notes from pcp, patients reported headache started 3 months ago, bilateral frontal and behind the eyes, no radiation, severe, throbbing, dull and "sinus pressure" It improved with sleep, taking antibiotics since September along with ibuprofen and tylenol daily which help. Toradol has been administered. Topamax and prednisone were prescribed for him. Sed rate was ordered.   Review of Systems: Patient complains of symptoms per HPI as well  as the following symptoms: No CP, no SOB. Pertinent negatives per HPI. All others negative.  Social History   Socioeconomic History  . Marital status: Married    Spouse name: Jesse [name]  . Number of children: 4  . Years of education: 12+  . Highest education level: Not on  file  Social Needs  . Financial resource strain: Not on file  . Food insecurity - worry: Not on file  . Food insecurity - inability: Not on file  . Transportation needs - medical: Not on file  . Transportation needs - non-medical: Not on file  Occupational History  . Occupation: Retired  Tobacco Use  . Smoking status: Former Research scientist (life sciences)  . Smokeless tobacco: Never Used  Substance and Sexual Activity  . Alcohol use: No  . Drug use: No  . Sexual activity: Not on file  Other Topics Concern  . Not on file  Social History Narrative   Lives with wife   Caffeine use: Drinks decaf coffee and tea   Writes right-handed, eats and shaves left-handed    Family History  Problem Relation Age of Onset  . Hypertension Mother   . Cancer Father   . Diabetes Brother   . Migraines Neg Hx     Past Medical History:  Diagnosis Date  . Fatty liver   . Headache   . Hyperlipidemia   . Hypertension     Past Surgical History:  Procedure Laterality Date  . CHOLECYSTECTOMY  1999  . GALLBLADDER SURGERY  2016  . NECK SURGERY  1995   Ruptured disc repair  . SHOULDER SURGERY Left 2005  . SHOULDER SURGERY Right 2016    Current Outpatient Medications  Medication Sig Dispense Refill  . aspirin 81 MG tablet Take 81 mg by mouth daily.    . candesartan (ATACAND) 4 MG tablet Take 1 tablet (4 mg total) by mouth daily. 30 tablet 6  . Ginger, Zingiber officinalis, (GINGER PO) Take by mouth daily.    . Glucosamine HCl (GLUCOSAMINE PO) Take by mouth daily.    . OMEGA-3 FATTY ACIDS PO Take by mouth daily.    Marland Kitchen omeprazole (PRILOSEC) 20 MG capsule Take 20 mg by mouth daily.    . predniSONE (DELTASONE) 10 MG tablet Take 1 tablet by mouth daily. taper    . sulfamethoxazole-trimethoprim (BACTRIM DS,SEPTRA DS) 800-160 MG tablet Take 1 tablet by mouth 2 (two) times daily after a meal.    . SUMAtriptan (IMITREX) 100 MG tablet Take 1 tablet (100 mg total) by mouth once as needed. May repeat in 2 hours if headache  persists or recurs. 10 tablet 12  . tamsulosin (FLOMAX) 0.4 MG CAPS capsule Take 1 capsule by mouth daily.    Marland Kitchen topiramate (TOPAMAX) 100 MG tablet Take 1 tablet (100 mg total) by mouth at bedtime. 30 tablet 11   No current facility-administered medications for this visit.     Allergies as of 11/02/2017  . (No Known Allergies)    Vitals: BP 124/70   Pulse 66   Ht 6' (1.829 m)   Wt 209 lb 6.4 oz (95 kg)   BMI 28.40 kg/m  Last Weight:  Wt Readings from Last 1 Encounters:  11/02/17 209 lb 6.4 oz (95 kg)   Last Height:   Ht Readings from Last 1 Encounters:  11/02/17 6' (1.829 m)    Physical exam: Exam: Gen: NAD, conversant, well nourised, obese, well groomed  CV: RRR, no MRG. No Carotid Bruits. No peripheral edema, warm, nontender Eyes: Conjunctivae clear without exudates or hemorrhage  Neuro: Detailed Neurologic Exam  Speech:    Speech is normal; fluent and spontaneous with normal comprehension.  Cognition:    The patient is oriented to person, place, and time;     recent and remote memory intact;     language fluent;     normal attention, concentration,     fund of knowledge Cranial Nerves:    The pupils are equal, round, and reactive to light. The fundi are normal and spontaneous venous pulsations are present. Visual fields are full to finger confrontation. Extraocular movements are intact. Trigeminal sensation is intact and the muscles of mastication are normal. The face is symmetric. The palate elevates in the midline. Hearing intact. Voice is normal. Shoulder shrug is normal. The tongue has normal motion without fasciculations.   Coordination:    Normal finger to nose and heel to shin. Normal rapid alternating movements.   Gait:    Heel-toe and tandem gait are normal.   Motor Observation:    No asymmetry, no atrophy, and no involuntary movements noted. Tone:    Normal muscle tone.    Posture:    Posture is normal. normal erect      Strength:    Strength is V/V in the upper and lower limbs.      Sensation: intact to LT     Reflex Exam:   Assessment/Plan:Chronic daily headaches for 40 years, now improved to 15 mild a month since addressing his medication overuse and starting Topamax and Candesartan. 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   Candesartan expensive, discussed other medications such as propranolol and Ajovy. Discussed side effects of both especially cardiac abnormalities, bradycardia and hypotension. Would start very low dose inderal 64m qhs in place of candesartan.    As far as your medications are concerned, I would like to suggest:  - Topamax(Topiramate): continue - If side effects to Topiramate discussed Depakote as an alternative - Continue Candesartan 470mdaily as a daily preventative also will help with elevated BP.   At onset of headache, take Sumatriptan. Please take one tablet at the onset of your headache. If it does not improve the symptoms please take one additional tablet in 2 hours. Do not take more then 2 tablets in 24hrs. Do not take use more then 2 to 3 times in a week.  I would like to see you back in 1 year, sooner if we need to. Please call usKoreaith any interim questions, concerns, problems, updates or refill requests.    I would like to see you back in 6 months, sooner if we need to. Please call usKoreaith any interim questions, concerns, problems, updates or refill requests.   ([facility] report states no acute intracranial abnormality. No other details on the brain parenchyma unfortunately specifically no mention of brain volume, white matter changes etc. Labs 09/20/15 CBC unremarkable, cmp also unremarkable bun 13, creat .7,hgba1c 5.7) [facility] [address] [id number] [phone] total of 15 minutes was  spent face-to-face with this patient. Over half this time was spent on counseling patient on the Chronic Migraine diagnosis and different diagnostic and therapeutic options available.

## 2017-12-28 ENCOUNTER — Telehealth: Payer: Self-pay | Admitting: *Deleted

## 2017-12-28 MED ORDER — TOPIRAMATE 100 MG PO TABS: 100.0000 mg | ORAL_TABLET | Freq: Every day | ORAL | 4 refills | Status: DC

## 2017-12-28 NOTE — Telephone Encounter (Signed)
Called [facility] and spoke with pharmacy staff who will cancel Topiramate prescription.

## 2017-12-28 NOTE — Telephone Encounter (Signed)
Spoke with patient. He states [facility] is unable to get Topiramate (backordered) so he would like the [facility] [address] [facility] [place]. [facility] Will call back to cancel Topiramate prescription.

## 2018-01-05 ENCOUNTER — Encounter: Payer: Self-pay | Admitting: Neurology

## 2018-01-07 ENCOUNTER — Telehealth: Payer: Self-pay | Admitting: Neurology

## 2018-01-07 NOTE — Telephone Encounter (Signed)
Spoke with patient. He states he has about a week left of the candesartan. I advised patient that message will be sent to Dr. Lucia GaskinsAhern and we will get back with him, possibly Monday due to Dr. Lucia GaskinsAhern being out of office at this time. He verbalized understanding and appreciation.

## 2018-01-07 NOTE — Telephone Encounter (Signed)
Pt is running out of candesartan (ATACAND) 4 MG tablet and is wanting to know if Dr. Lucia GaskinsAhern is still wanting him to change medication or just refill this one

## 2018-01-09 NOTE — Telephone Encounter (Signed)
I sent him an email will discuss with him via there thanks

## 2018-01-13 ENCOUNTER — Other Ambulatory Visit: Payer: Self-pay | Admitting: Neurology

## 2018-01-13 MED ORDER — PROPRANOLOL HCL 20 MG PO TABS: 20.0000 mg | ORAL_TABLET | Freq: Two times a day (BID) | ORAL | 6 refills | Status: DC

## 2018-01-18 NOTE — Telephone Encounter (Addendum)
[facility] Called patient and LVM asking for call back.

## 2018-01-20 NOTE — Telephone Encounter (Signed)
Called patient again and LVM asking for back. Informed patient that RN has questions about his medications.

## 2018-01-21 NOTE — Addendum Note (Signed)
Addended by: Bertram SavinULBERTSON, Stassi Fadely L on: 01/21/2018 01:04 PM   Modules accepted: Orders

## 2018-01-21 NOTE — Telephone Encounter (Addendum)
Spoke with patient and clarified that per his discussion with Dr. Lucia GaskinsAhern, he is now taking Propranolol 20 mg instead of Candesartan. He said the Propranolol seems to be working well. RN told pt she would call pharmacy to clarify that he is no longer on Candesartan. He verbalized appreciation.   [facility] [place] [facility] with [facility]. RN informed them of the above. They verbalized understanding and stated that they had no active requests for Candesartan.

## 2018-01-21 NOTE — Telephone Encounter (Signed)
Pt returned Rn's call. Please call him at [phone], this is a different phone number.

## 2018-05-07 ENCOUNTER — Other Ambulatory Visit: Payer: Self-pay | Admitting: Neurology

## 2018-07-05 NOTE — Pre-Procedure Instructions (Signed)
Shanna CiscoHubbard D Armstead Jr.  07/05/2018      [id number] [place] [place] - [address] [place] [id number] Phone: [phone] Fax: [phone]    Your procedure is scheduled on Wed., Aug. 7, 2019 from 12:30PM-4:30PM  [facility]" at 10:30AM  Call this number if you have problems the morning of surgery:  [phone]   Remember:  Do not eat or drink after midnight on Aug. 6th    Take these medicines the morning of surgery with A SIP OF WATER: Omeprazole (PRILOSEC)  Propranolol (INDERAL)  Follow your surgeon's instructions on when to stop Aspirin.  If no instructions were given by your surgeon then you will need to call the office to get those instructions.    As of today, stop taking all Other Aspirin Products, Vitamins, Fish oils, and Herbal medications. Also stop all NSAIDS i.e. Advil, Ibuprofen, Motrin, Aleve, Anaprox, Naproxen, BC, Goody Powders, and all Supplements.    Do not wear jewelry.  Do not wear lotions, powders, colognes, or deodorant.  Do not shave 48 hours prior to surgery.  Men may shave face.  Do not bring valuables to the hospital.  [facility] is not responsible for any belongings or valuables.  Contacts, dentures or bridgework may not be worn into surgery.  Leave your suitcase in the car.  After surgery it may be brought to your room.  For patients admitted to the hospital, discharge time will be determined by your treatment team.  Patients discharged the day of surgery will not be allowed to drive home.   Special instructions:   [facility]- Preparing For Surgery  Before surgery, you can play an important role. Because skin is not sterile, your skin needs to be as free of germs as possible. You can reduce the number of germs on your skin by washing with CHG (chlorahexidine gluconate) Soap before surgery.  CHG is an antiseptic cleaner which kills germs and bonds with the skin to  continue killing germs even after washing.    Oral Hygiene is also important to reduce your risk of infection.  Remember - BRUSH YOUR TEETH THE MORNING OF SURGERY WITH YOUR REGULAR TOOTHPASTE  Please do not use if you have an allergy to CHG or antibacterial soaps. If your skin becomes reddened/irritated stop using the CHG.  Do not shave (including legs and underarms) for at least 48 hours prior to first CHG shower. It is OK to shave your face.  Please follow these instructions carefully.   1. Shower the NIGHT BEFORE SURGERY and the MORNING OF SURGERY with CHG.   2. If you chose to wash your hair, wash your hair first as usual with your normal shampoo.  3. After you shampoo, rinse your hair and body thoroughly to remove the shampoo.  4. Use CHG as you would any other liquid soap. You can apply CHG directly to the skin and wash gently with a scrungie or a clean washcloth.   5. Apply the CHG Soap to your body ONLY FROM THE NECK DOWN.  Do not use on open wounds or open sores. Avoid contact with your eyes, ears, mouth and genitals (private parts). Wash Face and genitals (private parts)  with your normal soap.  6. Wash thoroughly, paying special attention to the area where your surgery will be performed.  7. Thoroughly rinse your body with warm water from the neck down.  8. DO NOT  shower/wash with your normal soap after using and rinsing off the CHG Soap.  9. Pat yourself dry with a CLEAN TOWEL.  10. Wear CLEAN PAJAMAS to bed the night before surgery, wear comfortable clothes the morning of surgery  11. Place CLEAN SHEETS on your bed the night of your first shower and DO NOT SLEEP WITH PETS.  Day of Surgery:  Do not apply any deodorants/lotions.  Please wear clean clothes to the hospital/surgery center.   Remember to brush your teeth WITH YOUR REGULAR TOOTHPASTE.  Please read over the following fact sheets that you were given. Pain Booklet, Coughing and Deep Breathing, MRSA  Information and Surgical Site Infection Prevention

## 2018-07-06 ENCOUNTER — Encounter (HOSPITAL_COMMUNITY)
Admission: RE | Admit: 2018-07-06 | Discharge: 2018-07-06 | Disposition: A | Discharge status: Home / Self Care | Payer: Medicare Other | Source: Ambulatory Visit | Attending: Orthopedic Surgery | Admitting: Orthopedic Surgery

## 2018-07-06 ENCOUNTER — Encounter (HOSPITAL_COMMUNITY): Payer: Self-pay

## 2018-07-06 ENCOUNTER — Other Ambulatory Visit: Payer: Self-pay

## 2018-07-06 DIAGNOSIS — Z01812 Encounter for preprocedural laboratory examination: Secondary | ICD-10-CM | POA: Diagnosis present

## 2018-07-06 DIAGNOSIS — Z01810 Encounter for preprocedural cardiovascular examination: Secondary | ICD-10-CM | POA: Insufficient documentation

## 2018-07-06 HISTORY — DX: Spinal stenosis, lumbar region without neurogenic claudication: M48.061

## 2018-07-06 HISTORY — DX: Unspecified osteoarthritis, unspecified site: M19.90

## 2018-07-06 HISTORY — DX: Personal history of urinary calculi: Z87.442

## 2018-07-06 HISTORY — DX: Gastro-esophageal reflux disease without esophagitis: K21.9

## 2018-07-06 LAB — COMPREHENSIVE METABOLIC PANEL
ALBUMIN: 4 g/dL (ref 3.5–5.0)
ALK PHOS: 81 U/L (ref 38–126)
ALT: 37 U/L (ref 0–44)
ANION GAP: 9 (ref 5–15)
AST: 29 U/L (ref 15–41)
BUN: 10 mg/dL (ref 8–23)
CALCIUM: 9.4 mg/dL (ref 8.9–10.3)
CO2: 22 mmol/L (ref 22–32)
CREATININE: 0.91 mg/dL (ref 0.61–1.24)
Chloride: 111 mmol/L (ref 98–111)
GFR calc Af Amer: >60 mL/min (ref >60)
GFR calc non Af Amer: >60 mL/min (ref >60)
Glucose, Bld: 109 mg/dL — ABNORMAL HIGH (ref 70–99)
Potassium: 3.5 mmol/L (ref 3.5–5.1)
SODIUM: 142 mmol/L (ref 135–145)
Total Bilirubin: 0.9 mg/dL (ref 0.3–1.2)
Total Protein: 6.6 g/dL (ref 6.5–8.1)

## 2018-07-06 LAB — TYPE AND SCREEN
ABO/RH(D): O POS
Antibody Screen: NEGATIVE

## 2018-07-06 LAB — CBC
HCT: 44.6 % (ref 39.0–52.0)
Hemoglobin: 14.9 g/dL (ref 13.0–17.0)
MCH: 29.4 pg (ref 26.0–34.0)
MCHC: 33.4 g/dL (ref 30.0–36.0)
MCV: 88 fL (ref 78.0–100.0)
PLATELETS: 139 10*3/uL — AB (ref 150–400)
RBC: 5.07 MIL/uL (ref 4.22–5.81)
RDW: 13.3 % (ref 11.5–15.5)
WBC: 5.4 10*3/uL (ref 4.0–10.5)

## 2018-07-06 LAB — ABO/RH: ABO/RH(D): O POS

## 2018-07-06 LAB — SURGICAL PCR SCREEN
MRSA, PCR: NEGATIVE
Staphylococcus aureus: NEGATIVE

## 2018-07-06 NOTE — Progress Notes (Signed)
PCP - Dr. Sedalia Mutaox-   Cardiologist - Denies  Chest x-ray - [place]  EKG - 07/06/18  Stress Test - 20 yrs- neg  ECHO - Denies  Cardiac Cath - Denies  Sleep Study - Denies CPAP - None  LABS- 07/06/18: CBC, CMP, T/S, PCR  ASA- LD- 7/27   Anesthesia- Yes- surgical order  Pt denies having chest pain, sob, or fever at this time. All instructions explained to the pt, with a verbal understanding of the material. Pt agrees to go over the instructions while at home for a better understanding. The opportunity to ask questions was provided.

## 2018-07-07 NOTE — Progress Notes (Addendum)
Anesthesia Chart Review:  Case:  [id number] Date/Time:  07/13/18 1215   Procedure:  ANTERIOR LATERAL LUMBAR FUSION L2-4 (N/A ) - 4 hrs   Anesthesia type:  General   Pre-op diagnosis:  Degenerative spinal stenosis, degenerative disc disease, radiculopathy   Location:  [facility] ROOM 04 / [facility]   Surgeon:  Venita LickBrooks, Dahari, MD    He is also scheduled for posterior spinal fusion interbody L2-4, possible decompression L2-4 on 07/14/2018.  DISCUSSION: Patient is a 77 year old male scheduled for the above procedures.   History includes former smoker, HTN, HLD, fatty liver, migraines, GERD. At PAT, he denied chest pain, SOB, fever. Based on currently available information, I would anticipate that he can proceed as planned if no acute changes.    VS: BP (!) 158/61   Pulse (!) 59   Temp (!) 36.4 C   Resp 20   Ht 6' (1.829 m)   Wt 221 lb 9.6 oz (100.5 kg)   SpO2 98%   BMI 30.05 kg/m   PROVIDERS: CoxFritzi Mandes, Kirsten, MD is PCP Rosalita Levan([place]). (Update 07/12/18 12:56 PM: Medical clearance letter received.) Naomie DeanAhern, Antonia, MD is neurologist (for headaches).   LABS: Labs reviewed: Acceptable for surgery. (all labs ordered are listed, but only abnormal results are displayed)  Labs Reviewed  COMPREHENSIVE METABOLIC PANEL - Abnormal; Notable for the following components:      Result Value   Glucose, Bld 109 (*)    All other components within normal limits  CBC - Abnormal; Notable for the following components:   Platelets 139 (*)    All other components within normal limits  SURGICAL PCR SCREEN  TYPE AND SCREEN  ABO/RH    IMAGES: CXR 07/04/18 [facility]):  IMPRESSION: Left base scarring. No active disease.   EKG: EKG 07/06/18: SR with first degree AV block.    CV: He reported a negative stress test ~ 20 years ago.   Past Medical History:  Diagnosis Date  . Arthritis   . Degenerative lumbar spinal stenosis   . Fatty liver   . GERD (gastroesophageal reflux disease)   . Headache   . History  of kidney stones   . Hyperlipidemia   . Hypertension   . Migraine without aura and without status migrainosus, not intractable     Past Surgical History:  Procedure Laterality Date  . CHOLECYSTECTOMY  1999  . GALLBLADDER SURGERY  2016  . NECK SURGERY  1995   Ruptured disc repair  . SHOULDER SURGERY Left 2005  . SHOULDER SURGERY Right 2016  . THROAT SURGERY     stretching    MEDICATIONS: . aspirin 81 MG tablet  . GLUCOSAMINE-CHONDROITIN PO  . Nutritional Supplements (JUICE PLUS FIBRE PO)  . OMEGA-3 FATTY ACIDS PO  . omeprazole (PRILOSEC) 20 MG capsule  . propranolol (INDERAL) 20 MG tablet  . tamsulosin (FLOMAX) 0.4 MG CAPS capsule  . topiramate (TOPAMAX) 100 MG tablet   No current facility-administered medications for this encounter.    Last ASA dose 07/02/18.   Velna Ochsllison Shaunae Sieloff, PA-C [facility]hesiology Phone [phone] 07/07/2018 12:09 PM

## 2018-07-08 NOTE — H&P (Addendum)
Patient ID: Jesse [name]. MRN: [id number] DOB/AGE: [birth year] 77 y.o.  Admit date: (Not on file)  Admission Diagnoses:  Lumbar degenerative disc Disease  HPI: Very pleasant 77 year old male patient presents to clinic prior to surgical intervention which will include a lateral interbody fusion as well as posterior fixation.  Patient reports a history of chronic headaches as well as hypertension.  Past Medical History: Past Medical History:  Diagnosis Date  . Arthritis   . Degenerative lumbar spinal stenosis   . Fatty liver   . GERD (gastroesophageal reflux disease)   . Headache   . History of kidney stones   . Hyperlipidemia   . Hypertension   . Migraine without aura and without status migrainosus, not intractable     Surgical History: Past Surgical History:  Procedure Laterality Date  . CHOLECYSTECTOMY  1999  . GALLBLADDER SURGERY  2016  . NECK SURGERY  1995   Ruptured disc repair  . SHOULDER SURGERY Left 2005  . SHOULDER SURGERY Right 2016  . THROAT SURGERY     stretching    Family History: Family History  Problem Relation Age of Onset  . Hypertension Mother   . Cancer Father   . Diabetes Brother   . Migraines Neg Hx     Social History: Social History   Socioeconomic History  . Marital status: Married    Spouse name: Darel Hong  . Number of children: 4  . Years of education: 12+  . Highest education level: Not on file  Occupational History  . Occupation: Retired  Engineer, production  . Financial resource strain: Not on file  . Food insecurity:    Worry: Not on file    Inability: Not on file  . Transportation needs:    Medical: Not on file    Non-medical: Not on file  Tobacco Use  . Smoking status: Former Games developer  . Smokeless tobacco: Never Used  Substance and Sexual Activity  . Alcohol use: No  . Drug use: No  . Sexual activity: Not on file  Lifestyle  . Physical activity:    Days per week: Not on file    Minutes per session: Not on file  .  Stress: Not on file  Relationships  . Social connections:    Talks on phone: Not on file    Gets together: Not on file    Attends religious service: Not on file    Active member of club or organization: Not on file    Attends meetings of clubs or organizations: Not on file    Relationship status: Not on file  . Intimate partner violence:    Fear of current or ex partner: Not on file    Emotionally abused: Not on file    Physically abused: Not on file    Forced sexual activity: Not on file  Other Topics Concern  . Not on file  Social History Narrative   Lives with wife   Caffeine use: Drinks decaf coffee and tea   Writes right-handed, eats and shaves left-handed    Allergies: Penicillins  Medications: I have reviewed the patient's current medications.  Vital Signs: No data found.  Radiology: No results found.  Labs: Recent Labs    07/06/18 1032  WBC 5.4  RBC 5.07  HCT 44.6  PLT 139*   Recent Labs    07/06/18 1032  NA 142  K 3.5  CL 111  CO2 22  BUN 10  CREATININE 0.91  GLUCOSE 109*  CALCIUM 9.4   No results for input(s): LABPT, INR in the last 72 hours.  Review of Systems: ROS  Physical Exam: There is no height or weight on file to calculate BMI.  Physical Exam  Constitutional: He is oriented to person, place, and time. He appears well-developed and well-nourished.  HENT:  Head: Normocephalic.  Cardiovascular: Normal rate and regular rhythm.  Respiratory: Effort normal and breath sounds normal.  GI: Soft. Bowel sounds are normal.  Neurological: He is alert and oriented to person, place, and time.  Skin: Skin is warm and dry.  Psychiatric: He has a normal mood and affect. His behavior is normal. Judgment and thought content normal.   He has significant back pain with forward flexion, as well as rotation.  There is slight improvement in his back pain with extension.  Extension of the spine intensifies his bilateral buttock and hamstring pain.  He  has relief of his neurogenic claudication symptoms with forward flexion.  He has no significant hip, knee, ankle pain with isolated joint range of motion.  Lumbar MRI: completed on 06/07/18 was reviewed with the patient.  I have also reviewed the radiology report.  Patient has multiple small cysts within each kidney.  Severe canal stenosis at L2-3, moderate to severe stenosis at L3-4.  Mild disease at L4-5 and L5-S1.  Multilevel degenerative disc changes L2-3 L3-4 most prominent.   Assessment and Plan: Risks, benefits of surgery were reviewed with the patient. These include: infection, bleeding, death, stroke, paralysis, ongoing or worse pain, need for additional surgery, injury to the lumbar plexus resulting in hip flexor weakness and difficulty walking without assistive devices. Adjacent segment degenerative disease, need for additional surgery including fusing other levels, leak of spinal fluid, Nonunion, hardware failure, breakage, or mal-position. Deep venous thrombosis (DVT) requiring additional treatment such as filter, and/or medications. Injury to abdominal contents, loss in bowel and bladder control.  Goal of surgery is to reduce (not eliminate) pain, and improve quality of life.  Anette Riedelarmen Mayo, PAC for Venita Lickahari Saiya Crist, MD [facility] [phone]  Patient was evaluated this morning.  There is been no change in his clinical exam since his preoperative H&P on 07/08/2018.  I have gone over the surgical procedure with the patient and his wife and all their questions were addressed.  The plan for today is the lateral interbody fixation at L2-3 and L3-4.  This should provide relief from his discogenic pain as well as indirect decompression to address the spinal stenosis and neurogenic claudication pain.  Tomorrow if the neuropathic pain is resolved then we will proceed with straightforward pedicle screw supplementation.  If however he still having neuropathic pain then in addition to the  pedicle screw fixation also move forward with a decompression to directly relieve the compression on the nerve.  Both the patient and his wife is expressed understanding of this.  Risks and benefits were reviewed again with the patient and his wife.  All their questions were encouraged and addressed.

## 2018-07-12 ENCOUNTER — Encounter (HOSPITAL_COMMUNITY): Payer: Self-pay | Admitting: Certified Registered Nurse Anesthetist

## 2018-07-13 ENCOUNTER — Encounter (HOSPITAL_COMMUNITY): Payer: Self-pay | Admitting: *Deleted

## 2018-07-13 ENCOUNTER — Inpatient Hospital Stay (HOSPITAL_COMMUNITY)
Admission: RE | Admit: 2018-07-13 | Discharge: 2018-07-16 | DRG: 455 | Disposition: A | Discharge status: Home / Self Care | Payer: Medicare Other | Attending: Orthopedic Surgery | Admitting: Orthopedic Surgery

## 2018-07-13 ENCOUNTER — Inpatient Hospital Stay (HOSPITAL_COMMUNITY): Payer: Medicare Other | Admitting: Vascular Surgery

## 2018-07-13 ENCOUNTER — Inpatient Hospital Stay (HOSPITAL_COMMUNITY)
Admission: RE | Disposition: A | Discharge status: Home / Self Care | Payer: Self-pay | Source: Home / Self Care | Attending: Orthopedic Surgery

## 2018-07-13 ENCOUNTER — Other Ambulatory Visit: Payer: Self-pay

## 2018-07-13 ENCOUNTER — Inpatient Hospital Stay (HOSPITAL_COMMUNITY): Payer: Medicare Other | Admitting: Certified Registered Nurse Anesthetist

## 2018-07-13 ENCOUNTER — Inpatient Hospital Stay (HOSPITAL_COMMUNITY): Payer: Medicare Other

## 2018-07-13 DIAGNOSIS — I10 Essential (primary) hypertension: Secondary | ICD-10-CM | POA: Diagnosis present

## 2018-07-13 DIAGNOSIS — Z419 Encounter for procedure for purposes other than remedying health state, unspecified: Secondary | ICD-10-CM

## 2018-07-13 DIAGNOSIS — Z87442 Personal history of urinary calculi: Secondary | ICD-10-CM | POA: Diagnosis not present

## 2018-07-13 DIAGNOSIS — M48062 Spinal stenosis, lumbar region with neurogenic claudication: Secondary | ICD-10-CM | POA: Diagnosis present

## 2018-07-13 DIAGNOSIS — Z833 Family history of diabetes mellitus: Secondary | ICD-10-CM

## 2018-07-13 DIAGNOSIS — Z880 Allergy status to penicillin: Secondary | ICD-10-CM

## 2018-07-13 DIAGNOSIS — K760 Fatty (change of) liver, not elsewhere classified: Secondary | ICD-10-CM | POA: Diagnosis present

## 2018-07-13 DIAGNOSIS — Z981 Arthrodesis status: Secondary | ICD-10-CM

## 2018-07-13 DIAGNOSIS — Z9049 Acquired absence of other specified parts of digestive tract: Secondary | ICD-10-CM

## 2018-07-13 DIAGNOSIS — M5116 Intervertebral disc disorders with radiculopathy, lumbar region: Secondary | ICD-10-CM | POA: Diagnosis present

## 2018-07-13 DIAGNOSIS — E785 Hyperlipidemia, unspecified: Secondary | ICD-10-CM | POA: Diagnosis present

## 2018-07-13 DIAGNOSIS — Z87891 Personal history of nicotine dependence: Secondary | ICD-10-CM

## 2018-07-13 DIAGNOSIS — Z8249 Family history of ischemic heart disease and other diseases of the circulatory system: Secondary | ICD-10-CM | POA: Diagnosis not present

## 2018-07-13 DIAGNOSIS — K219 Gastro-esophageal reflux disease without esophagitis: Secondary | ICD-10-CM | POA: Diagnosis present

## 2018-07-13 HISTORY — PX: ANTERIOR LATERAL LUMBAR FUSION WITH PERCUTANEOUS SCREW 2 LEVEL: SHX5554

## 2018-07-13 HISTORY — DX: Arthrodesis status: Z98.1

## 2018-07-13 SURGERY — ANTERIOR LATERAL LUMBAR FUSION WITH PERCUTANEOUS SCREW 2 LEVEL
Anesthesia: General | Site: Back

## 2018-07-13 MED ORDER — 0.9 % SODIUM CHLORIDE (POUR BTL) OPTIME
TOPICAL | Status: DC | PRN
  Administered 2018-07-13: 1000 mL
  Administered 2018-07-13: 2000 mL

## 2018-07-13 MED ORDER — LACTATED RINGERS IV SOLN
INTRAVENOUS | Status: DC | PRN
  Administered 2018-07-13: 14:00:00 via INTRAVENOUS

## 2018-07-13 MED ORDER — MORPHINE SULFATE (PF) 2 MG/ML IV SOLN: 1.0000 mg | INTRAVENOUS | Status: DC | PRN

## 2018-07-13 MED ORDER — HEMOSTATIC AGENTS (NO CHARGE) OPTIME
TOPICAL | Status: DC | PRN
  Administered 2018-07-13: 1

## 2018-07-13 MED ORDER — POLYETHYLENE GLYCOL 3350 17 G PO PACK: 17.0000 g | PACK | Freq: Every day | ORAL | Status: DC | PRN

## 2018-07-13 MED ORDER — ONDANSETRON HCL 4 MG PO TABS: 4.0000 mg | ORAL_TABLET | Freq: Four times a day (QID) | ORAL | Status: DC | PRN

## 2018-07-13 MED ORDER — ACETAMINOPHEN 650 MG RE SUPP: 650.0000 mg | RECTAL | Status: DC | PRN

## 2018-07-13 MED ORDER — ACETAMINOPHEN 10 MG/ML IV SOLN
INTRAVENOUS | Status: DC | PRN
  Administered 2018-07-13: 1000 mg via INTRAVENOUS

## 2018-07-13 MED ORDER — TOPIRAMATE 100 MG PO TABS
100.0000 mg | ORAL_TABLET | Freq: Every day | ORAL | Status: DC
  Administered 2018-07-13 – 2018-07-15 (×3): 100 mg via ORAL
  Filled 2018-07-13 (×3): qty 1

## 2018-07-13 MED ORDER — ACETAMINOPHEN 325 MG PO TABS: 650.0000 mg | ORAL_TABLET | ORAL | Status: DC | PRN

## 2018-07-13 MED ORDER — SCOPOLAMINE 1 MG/3DAYS TD PT72
1.0000 | MEDICATED_PATCH | TRANSDERMAL | Status: DC
  Administered 2018-07-13: 1.5 mg via TRANSDERMAL
  Filled 2018-07-13: qty 1

## 2018-07-13 MED ORDER — MAGNESIUM CITRATE PO SOLN
1.0000 | Freq: Once | ORAL | Status: AC | PRN
  Administered 2018-07-15: 1 via ORAL
  Filled 2018-07-13: qty 296

## 2018-07-13 MED ORDER — METHOCARBAMOL 500 MG PO TABS: 500.0000 mg | ORAL_TABLET | Freq: Three times a day (TID) | ORAL | 0 refills | Status: DC

## 2018-07-13 MED ORDER — DOCUSATE SODIUM 100 MG PO CAPS
100.0000 mg | ORAL_CAPSULE | Freq: Two times a day (BID) | ORAL | Status: DC
  Administered 2018-07-13 – 2018-07-16 (×6): 100 mg via ORAL
  Filled 2018-07-13 (×6): qty 1

## 2018-07-13 MED ORDER — THROMBIN (RECOMBINANT) 20000 UNITS EX SOLR
CUTANEOUS | Status: AC
  Filled 2018-07-13: qty 20000

## 2018-07-13 MED ORDER — SUCCINYLCHOLINE CHLORIDE 20 MG/ML IJ SOLN
INTRAMUSCULAR | Status: DC | PRN
  Administered 2018-07-13: 120 mg via INTRAVENOUS

## 2018-07-13 MED ORDER — ARTIFICIAL TEARS OPHTHALMIC OINT
TOPICAL_OINTMENT | OPHTHALMIC | Status: DC | PRN
  Administered 2018-07-13: 1 via OPHTHALMIC

## 2018-07-13 MED ORDER — SODIUM CHLORIDE 0.9 % IV SOLN
0.0500 ug/kg/min | INTRAVENOUS | Status: AC
  Administered 2018-07-13: 0.15 ug/kg/min via INTRAVENOUS
  Filled 2018-07-13: qty 5000

## 2018-07-13 MED ORDER — ONDANSETRON HCL 4 MG/2ML IJ SOLN
4.0000 mg | Freq: Four times a day (QID) | INTRAMUSCULAR | Status: DC | PRN
Start: 2018-07-13 — End: 2018-07-16
  Administered 2018-07-14: 4 mg via INTRAVENOUS

## 2018-07-13 MED ORDER — PANTOPRAZOLE SODIUM 40 MG PO TBEC
40.0000 mg | DELAYED_RELEASE_TABLET | Freq: Every day | ORAL | Status: DC
  Administered 2018-07-13 – 2018-07-16 (×4): 40 mg via ORAL
  Filled 2018-07-13 (×4): qty 1

## 2018-07-13 MED ORDER — PROPOFOL 1000 MG/100ML IV EMUL
INTRAVENOUS | Status: AC
  Filled 2018-07-13: qty 100

## 2018-07-13 MED ORDER — ALBUMIN HUMAN 5 % IV SOLN
INTRAVENOUS | Status: DC | PRN
  Administered 2018-07-13 (×2): via INTRAVENOUS

## 2018-07-13 MED ORDER — VANCOMYCIN HCL 10 G IV SOLR
1500.0000 mg | INTRAVENOUS | Status: AC
  Administered 2018-07-13: 1500 mg via INTRAVENOUS
  Filled 2018-07-13: qty 1500

## 2018-07-13 MED ORDER — VANCOMYCIN HCL IN DEXTROSE 1-5 GM/200ML-% IV SOLN
1000.0000 mg | Freq: Once | INTRAVENOUS | Status: AC
  Administered 2018-07-14: 1000 mg via INTRAVENOUS
  Filled 2018-07-13: qty 200

## 2018-07-13 MED ORDER — MENTHOL 3 MG MT LOZG: 1.0000 | LOZENGE | OROMUCOSAL | Status: DC | PRN

## 2018-07-13 MED ORDER — GLYCOPYRROLATE 0.2 MG/ML IJ SOLN
INTRAMUSCULAR | Status: DC | PRN
  Administered 2018-07-13: 0.2 mg via INTRAVENOUS

## 2018-07-13 MED ORDER — TAMSULOSIN HCL 0.4 MG PO CAPS
0.4000 mg | ORAL_CAPSULE | Freq: Every day | ORAL | Status: DC
  Administered 2018-07-13 – 2018-07-15 (×3): 0.4 mg via ORAL
  Filled 2018-07-13 (×3): qty 1

## 2018-07-13 MED ORDER — OXYCODONE-ACETAMINOPHEN 10-325 MG PO TABS: 1.0000 | ORAL_TABLET | ORAL | 0 refills | Status: AC | PRN

## 2018-07-13 MED ORDER — CEFAZOLIN SODIUM-DEXTROSE 2-4 GM/100ML-% IV SOLN: 2.0000 g | INTRAVENOUS | Status: DC

## 2018-07-13 MED ORDER — FENTANYL CITRATE (PF) 100 MCG/2ML IJ SOLN
INTRAMUSCULAR | Status: AC
  Administered 2018-07-13: 23:00:00
  Filled 2018-07-13: qty 2

## 2018-07-13 MED ORDER — THROMBIN 20000 UNITS EX SOLR
CUTANEOUS | Status: DC | PRN
  Administered 2018-07-13: 20000 [IU] via TOPICAL

## 2018-07-13 MED ORDER — PHENYLEPHRINE HCL 10 MG/ML IJ SOLN
INTRAMUSCULAR | Status: DC | PRN
  Administered 2018-07-13: 25 ug/min via INTRAVENOUS

## 2018-07-13 MED ORDER — BUPIVACAINE-EPINEPHRINE 0.25% -1:200000 IJ SOLN
INTRAMUSCULAR | Status: DC | PRN
  Administered 2018-07-13: 10 mL

## 2018-07-13 MED ORDER — ONDANSETRON HCL 4 MG/2ML IJ SOLN
INTRAMUSCULAR | Status: DC | PRN
  Administered 2018-07-13: 4 mg via INTRAVENOUS

## 2018-07-13 MED ORDER — LIDOCAINE HCL (CARDIAC) PF 100 MG/5ML IV SOSY
PREFILLED_SYRINGE | INTRAVENOUS | Status: DC | PRN
  Administered 2018-07-13: 100 mg via INTRAVENOUS

## 2018-07-13 MED ORDER — EPHEDRINE SULFATE 50 MG/ML IJ SOLN
INTRAMUSCULAR | Status: DC | PRN
  Administered 2018-07-13 (×2): 5 mg via INTRAVENOUS

## 2018-07-13 MED ORDER — PHENOL 1.4 % MT LIQD: 1.0000 | OROMUCOSAL | Status: DC | PRN

## 2018-07-13 MED ORDER — SODIUM CHLORIDE 0.9% FLUSH: 3.0000 mL | Freq: Two times a day (BID) | INTRAVENOUS | Status: DC

## 2018-07-13 MED ORDER — DEXAMETHASONE SODIUM PHOSPHATE 10 MG/ML IJ SOLN
INTRAMUSCULAR | Status: AC
  Filled 2018-07-13: qty 1

## 2018-07-13 MED ORDER — PROPOFOL 10 MG/ML IV BOLUS
INTRAVENOUS | Status: AC
  Filled 2018-07-13: qty 20

## 2018-07-13 MED ORDER — BUPIVACAINE-EPINEPHRINE (PF) 0.25% -1:200000 IJ SOLN
INTRAMUSCULAR | Status: AC
  Filled 2018-07-13: qty 30

## 2018-07-13 MED ORDER — FENTANYL CITRATE (PF) 100 MCG/2ML IJ SOLN
INTRAMUSCULAR | Status: AC
  Filled 2018-07-13: qty 2

## 2018-07-13 MED ORDER — GELATIN ABSORBABLE 12-7 MM EX MISC
CUTANEOUS | Status: DC | PRN
  Administered 2018-07-13: 1

## 2018-07-13 MED ORDER — ONDANSETRON 4 MG PO TBDP: 4.0000 mg | ORAL_TABLET | Freq: Three times a day (TID) | ORAL | 0 refills | Status: DC | PRN

## 2018-07-13 MED ORDER — OXYCODONE HCL 5 MG PO TABS
10.0000 mg | ORAL_TABLET | ORAL | Status: DC | PRN
  Administered 2018-07-13 – 2018-07-15 (×9): 10 mg via ORAL
  Filled 2018-07-13 (×8): qty 2

## 2018-07-13 MED ORDER — METHOCARBAMOL 500 MG PO TABS
500.0000 mg | ORAL_TABLET | Freq: Four times a day (QID) | ORAL | Status: DC | PRN
  Administered 2018-07-13 – 2018-07-16 (×5): 500 mg via ORAL
  Filled 2018-07-13 (×6): qty 1

## 2018-07-13 MED ORDER — PROPRANOLOL HCL 20 MG PO TABS
20.0000 mg | ORAL_TABLET | Freq: Two times a day (BID) | ORAL | Status: DC
  Administered 2018-07-13 – 2018-07-16 (×6): 20 mg via ORAL
  Filled 2018-07-13 (×6): qty 1

## 2018-07-13 MED ORDER — ARTIFICIAL TEARS OPHTHALMIC OINT
TOPICAL_OINTMENT | OPHTHALMIC | Status: AC
  Filled 2018-07-13: qty 3.5

## 2018-07-13 MED ORDER — FENTANYL CITRATE (PF) 100 MCG/2ML IJ SOLN
INTRAMUSCULAR | Status: DC | PRN
  Administered 2018-07-13: 250 ug via INTRAVENOUS

## 2018-07-13 MED ORDER — ACETAMINOPHEN 10 MG/ML IV SOLN
INTRAVENOUS | Status: AC
  Filled 2018-07-13: qty 100

## 2018-07-13 MED ORDER — GLYCOPYRROLATE PF 0.2 MG/ML IJ SOSY
PREFILLED_SYRINGE | INTRAMUSCULAR | Status: AC
  Filled 2018-07-13: qty 1

## 2018-07-13 MED ORDER — LACTATED RINGERS IV SOLN
INTRAVENOUS | Status: DC
  Administered 2018-07-14 (×2): via INTRAVENOUS

## 2018-07-13 MED ORDER — PROPOFOL 500 MG/50ML IV EMUL
INTRAVENOUS | Status: DC | PRN
  Administered 2018-07-13: 75 ug/kg/min via INTRAVENOUS
  Administered 2018-07-13: 17:00:00 via INTRAVENOUS

## 2018-07-13 MED ORDER — HYDROMORPHONE HCL 1 MG/ML IJ SOLN: 0.2500 mg | INTRAMUSCULAR | Status: DC | PRN

## 2018-07-13 MED ORDER — FENTANYL CITRATE (PF) 100 MCG/2ML IJ SOLN
25.0000 ug | INTRAMUSCULAR | Status: DC | PRN
  Administered 2018-07-13 (×3): 50 ug via INTRAVENOUS

## 2018-07-13 MED ORDER — ONDANSETRON HCL 4 MG/2ML IJ SOLN
INTRAMUSCULAR | Status: AC
  Filled 2018-07-13: qty 2

## 2018-07-13 MED ORDER — SODIUM CHLORIDE 0.9% FLUSH: 3.0000 mL | INTRAVENOUS | Status: DC | PRN

## 2018-07-13 MED ORDER — PROPOFOL 10 MG/ML IV BOLUS
INTRAVENOUS | Status: DC | PRN
  Administered 2018-07-13: 150 mg via INTRAVENOUS

## 2018-07-13 MED ORDER — EPHEDRINE 5 MG/ML INJ
INTRAVENOUS | Status: AC
  Filled 2018-07-13: qty 10

## 2018-07-13 MED ORDER — LACTATED RINGERS IV SOLN: INTRAVENOUS | Status: DC | PRN

## 2018-07-13 MED ORDER — METHOCARBAMOL 1000 MG/10ML IJ SOLN
500.0000 mg | Freq: Four times a day (QID) | INTRAVENOUS | Status: DC | PRN
  Filled 2018-07-13: qty 5

## 2018-07-13 MED ORDER — FENTANYL CITRATE (PF) 250 MCG/5ML IJ SOLN
INTRAMUSCULAR | Status: AC
  Filled 2018-07-13: qty 5

## 2018-07-13 MED ORDER — SUCCINYLCHOLINE CHLORIDE 200 MG/10ML IV SOSY
PREFILLED_SYRINGE | INTRAVENOUS | Status: AC
  Filled 2018-07-13: qty 10

## 2018-07-13 MED ORDER — LIDOCAINE 2% (20 MG/ML) 5 ML SYRINGE
INTRAMUSCULAR | Status: AC
  Filled 2018-07-13: qty 5

## 2018-07-13 MED ORDER — OXYCODONE HCL 5 MG PO TABS
5.0000 mg | ORAL_TABLET | ORAL | Status: DC | PRN
  Administered 2018-07-15 – 2018-07-16 (×2): 5 mg via ORAL
  Filled 2018-07-13 (×2): qty 1

## 2018-07-13 SURGICAL SUPPLY — 88 items
ADH SKN CLS APL DERMABOND .7 (GAUZE/BANDAGES/DRESSINGS) ×1
BLADE CLIPPER SURG (BLADE) IMPLANT
BLADE SURG 10 STRL SS (BLADE) ×3 IMPLANT
BONE MATRIX OSTEOCEL PRO MED (Bone Implant) ×4 IMPLANT
BONE VIVIGEN FORMABLE 5.4CC (Bone Implant) ×6 IMPLANT
CAGE COROENT XL WIDE 14X22X60M (Cage) ×2 IMPLANT
CAGE COROENT XL WIDE 16X22X60 (Cage) ×2 IMPLANT
CLOSURE STERI-STRIP 1/2X4 (GAUZE/BANDAGES/DRESSINGS) ×1
CLOSURE WOUND 1/2 X4 (GAUZE/BANDAGES/DRESSINGS) ×1
CLSR STERI-STRIP ANTIMIC 1/2X4 (GAUZE/BANDAGES/DRESSINGS) ×1 IMPLANT
CORDS BIPOLAR (ELECTRODE) ×3 IMPLANT
COVER BACK TABLE 60X90IN (DRAPES) ×2 IMPLANT
COVER SURGICAL LIGHT HANDLE (MISCELLANEOUS) ×3 IMPLANT
DERMABOND ADVANCED (GAUZE/BANDAGES/DRESSINGS) ×2
DERMABOND ADVANCED .7 DNX12 (GAUZE/BANDAGES/DRESSINGS) ×1 IMPLANT
DRAPE C-ARM 42X72 X-RAY (DRAPES) ×3 IMPLANT
DRAPE C-ARMOR (DRAPES) ×2 IMPLANT
DRAPE ORTHO SPLIT 77X108 STRL (DRAPES) ×3
DRAPE POUCH INSTRU U-SHP 10X18 (DRAPES) ×3 IMPLANT
DRAPE SURG ORHT 6 SPLT 77X108 (DRAPES) ×1 IMPLANT
DRAPE U-SHAPE 47X51 STRL (DRAPES) ×6 IMPLANT
DRSG OPSITE POSTOP 3X4 (GAUZE/BANDAGES/DRESSINGS) ×2 IMPLANT
DRSG OPSITE POSTOP 4X6 (GAUZE/BANDAGES/DRESSINGS) ×2 IMPLANT
DURAPREP 26ML APPLICATOR (WOUND CARE) ×3 IMPLANT
ELECT BLADE 4.0 EZ CLEAN MEGAD (MISCELLANEOUS) ×3
ELECT CAUTERY BLADE 6.4 (BLADE) ×3 IMPLANT
ELECT PENCIL ROCKER SW 15FT (MISCELLANEOUS) ×3 IMPLANT
ELECT REM PT RETURN 9FT ADLT (ELECTROSURGICAL) ×3
ELECTRODE BLDE 4.0 EZ CLN MEGD (MISCELLANEOUS) ×1 IMPLANT
ELECTRODE REM PT RTRN 9FT ADLT (ELECTROSURGICAL) ×1 IMPLANT
FLOSEAL 5ML (HEMOSTASIS) ×4 IMPLANT
GAUZE 4X4 16PLY RFD (DISPOSABLE) ×3 IMPLANT
GLOVE BIO SURGEON STRL SZ 6.5 (GLOVE) ×2 IMPLANT
GLOVE BIO SURGEONS STRL SZ 6.5 (GLOVE) ×1
GLOVE BIOGEL PI IND STRL 6.5 (GLOVE) ×1 IMPLANT
GLOVE BIOGEL PI IND STRL 8.5 (GLOVE) ×1 IMPLANT
GLOVE BIOGEL PI INDICATOR 6.5 (GLOVE) ×2
GLOVE BIOGEL PI INDICATOR 8.5 (GLOVE) ×2
GLOVE SS BIOGEL STRL SZ 8.5 (GLOVE) ×1 IMPLANT
GLOVE SUPERSENSE BIOGEL SZ 8.5 (GLOVE) ×2
GOWN STRL REUS W/ TWL LRG LVL3 (GOWN DISPOSABLE) ×1 IMPLANT
GOWN STRL REUS W/TWL 2XL LVL3 (GOWN DISPOSABLE) ×6 IMPLANT
GOWN STRL REUS W/TWL LRG LVL3 (GOWN DISPOSABLE) ×3
GRAFT BNE MATRIX VG FRMBL MD 5 (Bone Implant) IMPLANT
KIT BASIN OR (CUSTOM PROCEDURE TRAY) ×3 IMPLANT
KIT DILATOR XLIF 5 (KITS) ×1 IMPLANT
KIT NDL NVM5 EMG ELECT (KITS) IMPLANT
KIT NEEDLE NVM5 EMG ELECT (KITS) ×2 IMPLANT
KIT NEEDLE NVM5 EMG ELECTRODE (KITS) ×1
KIT SURGICAL ACCESS MAXCESS 4 (KITS) ×2 IMPLANT
KIT TURNOVER KIT B (KITS) ×3 IMPLANT
KIT XLIF (KITS) ×1
MODULE EMG NDL SSEP NVM5 (NEEDLE) IMPLANT
MODULE EMG NEEDLE SSEP NVM5 (NEEDLE) ×3 IMPLANT
NDL I-PASS III (NEEDLE) IMPLANT
NDL SPNL 18GX3.5 QUINCKE PK (NEEDLE) ×1 IMPLANT
NEEDLE 22X1 1/2 (OR ONLY) (NEEDLE) ×3 IMPLANT
NEEDLE I-PASS III (NEEDLE) IMPLANT
NEEDLE SPNL 18GX3.5 QUINCKE PK (NEEDLE) ×3 IMPLANT
NS IRRIG 1000ML POUR BTL (IV SOLUTION) ×7 IMPLANT
PACK LAMINECTOMY ORTHO (CUSTOM PROCEDURE TRAY) ×3 IMPLANT
PACK UNIVERSAL I (CUSTOM PROCEDURE TRAY) ×3 IMPLANT
PAD ARMBOARD 7.5X6 YLW CONV (MISCELLANEOUS) ×6 IMPLANT
SPONGE INTESTINAL PEANUT (DISPOSABLE) ×6 IMPLANT
SPONGE LAP 4X18 RFD (DISPOSABLE) ×3 IMPLANT
SPONGE SURGIFOAM ABS GEL 100 (HEMOSTASIS) ×2 IMPLANT
STAPLER VISISTAT 35W (STAPLE) ×2 IMPLANT
STRIP CLOSURE SKIN 1/2X4 (GAUZE/BANDAGES/DRESSINGS) ×1 IMPLANT
SURGIFLO W/THROMBIN 8M KIT (HEMOSTASIS) IMPLANT
SUT BONE WAX W31G (SUTURE) ×3 IMPLANT
SUT MON AB 3-0 SH 27 (SUTURE) ×3
SUT MON AB 3-0 SH27 (SUTURE) ×1 IMPLANT
SUT PROLENE 5 0 C 1 24 (SUTURE) IMPLANT
SUT SILK 2 0 TIES 10X30 (SUTURE) ×3 IMPLANT
SUT SILK 3 0 TIES 10X30 (SUTURE) ×3 IMPLANT
SUT VIC AB 1 CT1 27 (SUTURE) ×6
SUT VIC AB 1 CT1 27XBRD ANBCTR (SUTURE) ×2 IMPLANT
SUT VIC AB 1 CTX 36 (SUTURE) ×6
SUT VIC AB 1 CTX36XBRD ANBCTR (SUTURE) ×2 IMPLANT
SUT VIC AB 2-0 CT1 18 (SUTURE) ×3 IMPLANT
SYR BULB IRRIGATION 50ML (SYRINGE) ×3 IMPLANT
SYR CONTROL 10ML LL (SYRINGE) ×3 IMPLANT
TAPE CLOTH 4X10 WHT NS (GAUZE/BANDAGES/DRESSINGS) ×6 IMPLANT
TOWEL OR 17X24 6PK STRL BLUE (TOWEL DISPOSABLE) ×3 IMPLANT
TOWEL OR 17X26 10 PK STRL BLUE (TOWEL DISPOSABLE) ×3 IMPLANT
TRAY FOLEY CATH SILVER 16FR (SET/KITS/TRAYS/PACK) ×3 IMPLANT
TRAY FOLEY MTR SLVR 14FR STAT (SET/KITS/TRAYS/PACK) ×2 IMPLANT
WATER STERILE IRR 1000ML POUR (IV SOLUTION) ×1 IMPLANT

## 2018-07-13 NOTE — Op Note (Signed)
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  L2-3: 16 x 22 x 60 10 degree lordosis.  L3-4: 14 x 22 x 60 10 degree lordosis  Allograft:  osteocel and vivogen.  Traction time: L2-3: 27 minutes.  L3-4: 23 minutes.  Indications: This is a very pleasant active gentleman who presents with significant back pain buttock pain and neuropathic leg pain right worse than the left.  Imaging studies confirmed significant spinal stenosis with degenerative disc disease.  After discussing treatment options we elected to move forward with surgery.  The plan was a 2 staged procedure.  First stage would be the lateral interbody fusion which was done today.  Planned second staged augmented posterior fusion possible decompression depending upon whether or not the indirect decompression was achieved by the interbody fixation was adequate enough.  Prior to surgery I did review the procedure with the patient and his wife and we also discussed all the risks benefits and alternatives.  Consent was obtained and we proceeded with surgery.  Operative report  Patient was brought the operating room placed upon the operating room table.  After successful induction of general anesthesia and endotracheal intubation teds SCDs and a Foley were inserted.  Neuro monitoring representative then applied all the needles and pads for intraoperative neuro monitoring.  Once this was done the patient was turned to the lateral decubitus position (left side up was (.  Axillary roll was applied and all bony problems well-padded.  Using fluoroscopic guidance we identified the L2-3 and L3-4 levels and made sure the patient was properly positioned on the table in the lateral decubitus  position.  Once this was done he was secured to the table with tape.  The left flank was then prepped and draped in a standard fashion.  Timeout was taken to confirm patient procedure and all other important data.  Once this was done we began the surgery.  I marked out the incision site infiltrated with quarter percent Marcaine.  Transverse incision was made on the flank sharp dissection was carried out down to the fascia of the external oblique.  A second small incision was made posterior lateral approximately size my finger I then bluntly dissected down to the posterior fascia of the retroperitoneum and then entered into the retroperitoneum I then bluntly mobilized the retroperitoneal fat and then dissected bluntly through the undersurface of the oblique until I could see my finger in the original transverse incision I then advanced a dilating probe down to the lateral aspect of the L3-4 disc space.  I confirmed mid disc positioning on the lateral film and then secured it with a guidepin to the disc I then stimulated the dilator prob circumferentially to ensure I was not traumatizing the plexus.  I then sequentially dilated and with each dilation I stimulated circumferentially to ensure there is no trauma to the plexus.  Once the final dilator was in place I then placed the trocar in this is secured to the table.  I then stimulated to ensure the plexus was not being traumatized and then positioned in retractor and appropriate place and secured it directly to the disc space with the trocar.  With the L3-4 disc space exposed and annulotomy was performed with a 10 blade scalpel using curettes and Kerrison rongeurs and  a box osteotome I removed all the disc material and release the annulus from the contralateral side.  Once I had bleeding subchondral bone I then used my trials to determine the best fit.  I elected to use a size 14 lordotic cage is a provided the best fit this cage was obtained packed with the  allograft and malleted into position.  The final cage position was assessed using fluoroscopy noted to be satisfactory.  Wound was copiously irrigated with normal saline and then made sure that hemostasis using bipolar cautery.  With this level completed I then repositioned to begin the L2-3 level.  The trocar was removed and repositioned.  Using the same technique I used the L3-4 stimulated circumferentially to ensure there was no trauma to the plexus and then sequentially dilated to my final level.  I then placed the final dilator and secured it into the disc spaces neutral.  I now had excellent visualization of the lateral aspect of the L2-3 disc space.   An annulotomy was performed with a 10 blade scalpel, and then in a Cobb elevator was used to release the disc from the endplate of L2 and L3.  A box osteotome was then used to remove the bulk of the disc material and then I used curettes and Kerrison rongeurs as well as a pituitary rongeur to remove the rest of the disc material.  I made sure to release the contralateral side using a Cobb elevator.  Once the discectomy was complete and had bleeding subchondral bone I proceeded with the trials.  At this point the final trial that can provide the best fit was a size 16 x 22.  This the peak implant was obtained and packed with allograft.  He was then Altus Lumber[place] LPmalleted to the appropriate depth.  He had excellent fixation.  I then removed all of the retracting devices and took my final x-rays.  I made sure that hemostasis using bipolar electrocautery.  At this point the wound was copiously irrigated with normal saline and closed in a layered fashion with interrupted #1 Vicryl suture, 2-0 Vicryl suture, and 3-0 Monocryl.  Steri-Strips and dry dressings were applied and the patient was ultimately extubated transfer the PACU without incident.  The plan will be to admit the patient overnight and reevaluate in the morning to determine if posterior decompression is required or  all that is needed is supplemental pedicle screw fixation.  At the end of the case all needle sponge counts were correct.  There are no adverse intraoperative events.

## 2018-07-13 NOTE — Anesthesia Preprocedure Evaluation (Addendum)
Anesthesia Evaluation  Patient identified by MRN, date of birth, ID band Patient awake    Reviewed: Allergy & Precautions, NPO status , Patient's Chart, lab work & pertinent test results  History of Anesthesia Complications (+) PONV and history of anesthetic complications  Airway Mallampati: I  TM Distance: >3 FB Neck ROM: Full    Dental  (+) Edentulous Upper, Partial Lower, Dental Advisory Given   Pulmonary former smoker,    breath sounds clear to auscultation       Cardiovascular hypertension, Pt. on medications  Rhythm:Regular Rate:Normal  EKG 07/06/18 sinus rhythm with 1st degree AV block, no prior studies for comparison   Neuro/Psych  Headaches, negative psych ROS   GI/Hepatic GERD  Medicated and Controlled,Fatty liver disease   Endo/Other  negative endocrine ROS  Renal/GU negative Renal ROS     Musculoskeletal  (+) Arthritis , Osteoarthritis,  Lumbar spinal stenosis   Abdominal   Peds  Hematology negative hematology ROS (+)   Anesthesia Other Findings Day of surgery medications reviewed with the patient.  Reproductive/Obstetrics                             Lab Results  Component Value Date   WBC 5.4 07/06/2018   HGB 14.9 07/06/2018   HCT 44.6 07/06/2018   MCV 88.0 07/06/2018   PLT 139 (L) 07/06/2018    Anesthesia Physical Anesthesia Plan  ASA: II  Anesthesia Plan: General   Post-op Pain Management:    Induction: Intravenous  PONV Risk Score and Plan: Treatment may vary due to age or medical condition, Ondansetron and Dexamethasone  Airway Management Planned: Oral ETT  Additional Equipment:   Intra-op Plan:   Post-operative Plan: Extubation in OR  Informed Consent: I have reviewed the patients History and Physical, chart, labs and discussed the procedure including the risks, benefits and alternatives for the proposed anesthesia with the patient or authorized  representative who has indicated his/her understanding and acceptance.   Dental advisory given  Plan Discussed with: CRNA  Anesthesia Plan Comments:         Anesthesia Quick Evaluation

## 2018-07-13 NOTE — Progress Notes (Signed)
Pharmacy consulted to dose vancomycin for surgical prophylaxis after an anterior lateral lumbar fusion in this patient who is PCN allergic.   Patient received pre-op dose ~1430. Will redose vancomycin 1000mg  IV x 1 at 0230 on 8/8 (12 hours from pre-op dose). No drain noted, therefore patient will only receive one dose.   Thank you for the consult.   Kahlie Deutscher A. Jeanella CrazePierce, PharmD, BCPS Clinical Pharmacist [place] Pager: [phone] Please utilize Amion for appropriate phone number to reach the unit pharmacist [facility])

## 2018-07-13 NOTE — Transfer of Care (Signed)
Immediate Anesthesia Transfer of Care Note  Patient: Jesse HeadingHubbard D Yasuda Jr.  Procedure(s) Performed: ANTERIOR LATERAL LUMBAR FUSION L2-4 (N/A Back)  Patient Location: PACU  Anesthesia Type:General  Level of Consciousness: awake, alert  and patient cooperative  Airway & Oxygen Therapy: Patient Spontanous Breathing and Patient connected to face mask oxygen  Post-op Assessment: Report given to RN, Post -op Vital signs reviewed and stable, Patient moving all extremities X 4 and Patient able to stick tongue midline  Post vital signs: Reviewed and stable  Last Vitals:  Vitals Value Taken Time  BP 160/59 07/13/2018  5:21 PM  Temp    Pulse 74 07/13/2018  5:26 PM  Resp 17 07/13/2018  5:26 PM  SpO2 98 % 07/13/2018  5:26 PM  Vitals shown include unvalidated device data.  Last Pain:  Vitals:   07/13/18 1053  TempSrc: Oral         Complications: No apparent anesthesia complications

## 2018-07-13 NOTE — Anesthesia Postprocedure Evaluation (Signed)
Anesthesia Post Note  Patient: Jesse [name] D Macbeth Jr.  Procedure(s) Performed: ANTERIOR LATERAL LUMBAR FUSION L2-4 (N/A Back)     Patient location during evaluation: PACU Anesthesia Type: General Level of consciousness: awake Pain management: pain level controlled Vital Signs Assessment: post-procedure vital signs reviewed and stable Respiratory status: spontaneous breathing Cardiovascular status: stable Anesthetic complications: no    Last Vitals:  Vitals:   07/13/18 1730 07/13/18 1800  BP: (!) 160/59 (!) 174/83  Pulse: 69 65  Resp: 12 15  Temp: (!) 36.4 C   SpO2: 99% 99%    Last Pain:  Vitals:   07/13/18 1800  TempSrc:   PainSc: 4                  Elfreda Blanchet

## 2018-07-13 NOTE — Progress Notes (Signed)
Pt's contact: Marijean NiemannJudy Kook (wife)--- tel# [phone]

## 2018-07-13 NOTE — Discharge Instructions (Signed)
Spinal Fusion, Care After °These instructions give you information about caring for yourself after your procedure. Your doctor may also give you more specific instructions. Call your doctor if you have any problems or questions after your procedure. °Follow these instructions at home: °Medicines °· Take over-the-counter and prescription medicines only as told by your doctor. These include any medicines for pain. °· Do not drive for 24 hours if you received a sedative. °· Do not drive or use heavy machinery while taking prescription pain medicine. °· If you were prescribed an antibiotic medicine, take it as told by your doctor. Do not stop taking the antibiotic even if you start to feel better. °Surgical Cut (Incision) Care °· Follow instructions from your doctor about how to take care of your surgical cut. Make sure you: °? Wash your hands with soap and water before you change your bandage (dressing). If you cannot use soap and water, use hand sanitizer. °? Change your bandage as told by your doctor. °? Leave stitches (sutures), skin glue, or skin tape (adhesive) strips in place. They may need to stay in place for 2 weeks or longer. If tape strips get loose and curl up, you may trim the loose edges. Do not remove tape strips completely unless your doctor says it is okay. °· Keep your surgical cut clean and dry. Do not take baths, swim, or use a hot tub until your doctor says it is okay. °· Check your surgical cut and the area around it every day for: °? Redness. °? Swelling. °? Fluid. °Physical Activity °· Return to your normal activities as told by your doctor. Ask your doctor what activities are safe for you. Rest and protect your back as much as you can. °· Follow instructions from your doctor about how to move. Use good posture to help your spine heal. °· Do not lift anything that is heavier than 8 lb (3.6 kg) or as told by your doctor until he or she says that it is safe. Do not lift anything over your  head. °· Do not twist or bend at the waist until your doctor says it is okay. °· Avoid pushing or pulling motions. °· Do not sit or lie down in the same position for long periods of time. °· Do not start to exercise until your doctor says it is okay. Ask your doctor what kinds of exercise you can do to make your back stronger. °General instructions °· If you were given a brace, use it as told by your doctor. °· Wear compression stockings as told by your doctor. °· Do not use tobacco products. These include cigarettes, chewing tobacco, or e-cigarettes. If you need help quitting, ask your doctor. °· Keep all follow-up visits as told by your doctor. This is important. This includes any visits with your physical therapist, if this applies. °Contact a doctor if: °· Your pain gets worse. °· Your medicine does not help your pain. °· Your legs or feet become painful or swollen. °· Your surgical cut is red, swollen, or painful. °· You have fluid, blood, or pus coming from your surgical cut. °· You feel sick to your stomach (nauseous). °· You throw up (vomit). °· Your have weakness or loss of feeling (numbness) in your legs that is new or getting worse. °· You have a fever. °· You have trouble controlling when you pee (urinate) or poop (have a bowel movement). °Get help right away if: °· Your pain is very bad. °· You have   chest pain. °· You have trouble breathing. °· You start to have a cough. °These symptoms may be an emergency. Do not wait to see if the symptoms will go away. Get medical help right away. Call your local emergency services (911 in the [place] Do not drive yourself to the hospital. °This information is not intended to replace advice given to you by your health care provider. Make sure you discuss any questions you have with your health care provider. °Document Released: 03/19/2011 Document Revised: 07/21/2016 Document Reviewed: 05/08/2015 °Elsevier Interactive Patient Education © 2018 [employer] ° °

## 2018-07-13 NOTE — Brief Op Note (Signed)
07/13/2018  5:21 PM  PATIENT:  Jesse HeadingHubbard D Demetriou Jr.  77 y.o. male  PRE-OPERATIVE DIAGNOSIS:  Degenerative spinal stenosis, degenerative disc disease, radiculopathy  POST-OPERATIVE DIAGNOSIS:  Degenerative spinal stenosis, degenerative disc disease, radiculopathy  PROCEDURE:  Procedure(s) with comments: ANTERIOR LATERAL LUMBAR FUSION L2-4 (N/A) - 4 hrs  SURGEON:  Surgeon(s) and Role:    Venita Lick* Nahuel Wilbert, MD - Primary  PHYSICIAN ASSISTANT:   ASSISTANTS: Carmen Mayo    ANESTHESIA:   general  EBL:  150 mL   BLOOD ADMINISTERED:none  DRAINS: none   LOCAL MEDICATIONS USED:  MARCAINE     SPECIMEN:  No Specimen  DISPOSITION OF SPECIMEN:  N/A  COUNTS:  YES  TOURNIQUET:  * No tourniquets in log *  DICTATION: .Dragon Dictation  PLAN OF CARE: Admit to inpatient   PATIENT DISPOSITION:  PACU - hemodynamically stable.

## 2018-07-13 NOTE — Anesthesia Preprocedure Evaluation (Addendum)
Anesthesia Evaluation  Patient identified by MRN, date of birth, ID band Patient awake    Reviewed: Allergy & Precautions, NPO status , Patient's Chart, lab work & pertinent test results  History of Anesthesia Complications (+) PONV and history of anesthetic complications  Airway Mallampati: I  TM Distance: >3 FB Neck ROM: Full    Dental  (+) Edentulous Upper, Partial Lower, Dental Advisory Given   Pulmonary former smoker,    breath sounds clear to auscultation       Cardiovascular hypertension, Pt. on medications  Rhythm:Regular Rate:Normal  EKG 07/06/18 sinus rhythm with 1st degree AV block, no prior studies for comparison   Neuro/Psych  Headaches, negative psych ROS   GI/Hepatic GERD  Medicated and Controlled,Fatty liver disease   Endo/Other  negative endocrine ROS  Renal/GU negative Renal ROS     Musculoskeletal  (+) Arthritis , Osteoarthritis,  Lumbar spinal stenosis   Abdominal   Peds  Hematology negative hematology ROS (+)   Anesthesia Other Findings Day of surgery medications reviewed with the patient.  Reproductive/Obstetrics                            Anesthesia Physical Anesthesia Plan  ASA: III  Anesthesia Plan: General   Post-op Pain Management:    Induction: Intravenous  PONV Risk Score and Plan: 3 and Ondansetron, Dexamethasone, Treatment may vary due to age or medical condition, Scopolamine patch - Pre-op and Midazolam  Airway Management Planned: Oral ETT  Additional Equipment:   Intra-op Plan:   Post-operative Plan: Extubation in OR  Informed Consent: I have reviewed the patients History and Physical, chart, labs and discussed the procedure including the risks, benefits and alternatives for the proposed anesthesia with the patient or authorized representative who has indicated his/her understanding and acceptance.   Dental advisory given  Plan Discussed  with: CRNA  Anesthesia Plan Comments:        Anesthesia Quick Evaluation

## 2018-07-13 NOTE — Anesthesia Procedure Notes (Signed)
Procedure Name: Intubation Date/Time: 07/13/2018 2:34 PM Performed by: Belinda Block, MD Pre-anesthesia Checklist: Patient identified, Emergency Drugs available, Suction available and Patient being monitored Patient Re-evaluated:Patient Re-evaluated prior to induction Oxygen Delivery Method: Circle system utilized Preoxygenation: Pre-oxygenation with 100% oxygen Induction Type: IV induction Ventilation: Mask ventilation without difficulty Laryngoscope Size: Mac and 3 Grade View: Grade I Tube type: Oral Tube size: 7.0 mm Number of attempts: 1 Airway Equipment and Method: Stylet Placement Confirmation: ETT inserted through vocal cords under direct vision,  positive ETCO2 and breath sounds checked- equal and bilateral Secured at: 23 cm Tube secured with: Tape Dental Injury: Teeth and Oropharynx as per pre-operative assessment

## 2018-07-14 ENCOUNTER — Encounter (HOSPITAL_COMMUNITY)
Admission: RE | Disposition: A | Discharge status: Home / Self Care | Payer: Self-pay | Source: Home / Self Care | Attending: Orthopedic Surgery

## 2018-07-14 ENCOUNTER — Inpatient Hospital Stay (HOSPITAL_COMMUNITY): Payer: Medicare Other | Admitting: Anesthesiology

## 2018-07-14 ENCOUNTER — Inpatient Hospital Stay (HOSPITAL_COMMUNITY): Admission: RE | Admit: 2018-07-14 | Payer: Medicare Other | Source: Ambulatory Visit | Admitting: Orthopedic Surgery

## 2018-07-14 ENCOUNTER — Encounter (HOSPITAL_COMMUNITY): Payer: Self-pay | Admitting: Anesthesiology

## 2018-07-14 ENCOUNTER — Inpatient Hospital Stay (HOSPITAL_COMMUNITY): Payer: Medicare Other

## 2018-07-14 SURGERY — POSTERIOR LUMBAR FUSION 2 LEVEL
Anesthesia: General | Site: Spine Lumbar

## 2018-07-14 MED ORDER — FENTANYL CITRATE (PF) 100 MCG/2ML IJ SOLN
INTRAMUSCULAR | Status: DC | PRN
  Administered 2018-07-14: 50 ug via INTRAVENOUS
  Administered 2018-07-14: 25 ug via INTRAVENOUS
  Administered 2018-07-14: 75 ug via INTRAVENOUS
  Administered 2018-07-14 (×2): 50 ug via INTRAVENOUS

## 2018-07-14 MED ORDER — LIDOCAINE HCL (CARDIAC) PF 100 MG/5ML IV SOSY
PREFILLED_SYRINGE | INTRAVENOUS | Status: DC | PRN
  Administered 2018-07-14: 100 mg via INTRAVENOUS

## 2018-07-14 MED ORDER — DEXAMETHASONE SODIUM PHOSPHATE 10 MG/ML IJ SOLN
INTRAMUSCULAR | Status: DC | PRN
  Administered 2018-07-14: 10 mg via INTRAVENOUS

## 2018-07-14 MED ORDER — BUPIVACAINE-EPINEPHRINE (PF) 0.25% -1:200000 IJ SOLN
INTRAMUSCULAR | Status: AC
  Filled 2018-07-14: qty 30

## 2018-07-14 MED ORDER — METHOCARBAMOL 500 MG PO TABS
ORAL_TABLET | ORAL | Status: AC
  Administered 2018-07-14: 500 mg
  Filled 2018-07-14: qty 1

## 2018-07-14 MED ORDER — THROMBIN (RECOMBINANT) 20000 UNITS EX SOLR
CUTANEOUS | Status: AC
  Filled 2018-07-14: qty 20000

## 2018-07-14 MED ORDER — PROPOFOL 10 MG/ML IV BOLUS
INTRAVENOUS | Status: AC
  Filled 2018-07-14: qty 40

## 2018-07-14 MED ORDER — MEPERIDINE HCL 50 MG/ML IJ SOLN
6.2500 mg | INTRAMUSCULAR | Status: DC | PRN
Start: 2018-07-14 — End: 2018-07-14

## 2018-07-14 MED ORDER — PROMETHAZINE HCL 25 MG/ML IJ SOLN: 6.2500 mg | INTRAMUSCULAR | Status: DC | PRN

## 2018-07-14 MED ORDER — KETAMINE HCL 10 MG/ML IJ SOLN
INTRAMUSCULAR | Status: DC | PRN
  Administered 2018-07-14: 50 mg via INTRAVENOUS

## 2018-07-14 MED ORDER — BUPIVACAINE HCL (PF) 0.25 % IJ SOLN
INTRAMUSCULAR | Status: AC
  Filled 2018-07-14: qty 30

## 2018-07-14 MED ORDER — ACETAMINOPHEN 10 MG/ML IV SOLN
INTRAVENOUS | Status: DC | PRN
  Administered 2018-07-14: 1000 mg via INTRAVENOUS

## 2018-07-14 MED ORDER — BUPIVACAINE-EPINEPHRINE 0.25% -1:200000 IJ SOLN
INTRAMUSCULAR | Status: DC | PRN
  Administered 2018-07-14: 20 mL

## 2018-07-14 MED ORDER — HEMOSTATIC AGENTS (NO CHARGE) OPTIME
TOPICAL | Status: DC | PRN
  Administered 2018-07-14: 1 via TOPICAL

## 2018-07-14 MED ORDER — VANCOMYCIN HCL 500 MG IV SOLR
500.0000 mg | Freq: Once | INTRAVENOUS | Status: AC
  Administered 2018-07-14: 500 mg via INTRAVENOUS
  Filled 2018-07-14: qty 500

## 2018-07-14 MED ORDER — 0.9 % SODIUM CHLORIDE (POUR BTL) OPTIME
TOPICAL | Status: DC | PRN
  Administered 2018-07-14: 1000 mL

## 2018-07-14 MED ORDER — HYDROMORPHONE HCL 1 MG/ML IJ SOLN
0.2500 mg | INTRAMUSCULAR | Status: DC | PRN
  Administered 2018-07-14 (×3): 0.5 mg via INTRAVENOUS

## 2018-07-14 MED ORDER — HYDROMORPHONE HCL 1 MG/ML IJ SOLN
INTRAMUSCULAR | Status: AC
  Filled 2018-07-14: qty 1

## 2018-07-14 MED ORDER — SUCCINYLCHOLINE 20MG/ML (10ML) SYRINGE FOR MEDFUSION PUMP - OPTIME
INTRAMUSCULAR | Status: DC | PRN
  Administered 2018-07-14: 100 mg via INTRAVENOUS

## 2018-07-14 MED ORDER — ACETAMINOPHEN 10 MG/ML IV SOLN: 1000.0000 mg | Freq: Once | INTRAVENOUS | Status: DC | PRN

## 2018-07-14 MED ORDER — SODIUM CHLORIDE 0.9 % IV SOLN
0.0500 ug/kg/min | INTRAVENOUS | Status: AC
  Administered 2018-07-14: 0.1 ug/kg/min via INTRAVENOUS
  Filled 2018-07-14: qty 5000

## 2018-07-14 MED ORDER — KETAMINE HCL 50 MG/5ML IJ SOSY
PREFILLED_SYRINGE | INTRAMUSCULAR | Status: AC
  Filled 2018-07-14: qty 5

## 2018-07-14 MED ORDER — SODIUM CHLORIDE 0.9% FLUSH: 3.0000 mL | INTRAVENOUS | Status: DC | PRN

## 2018-07-14 MED ORDER — DIPHENHYDRAMINE HCL 25 MG PO CAPS
50.0000 mg | ORAL_CAPSULE | ORAL | Status: DC | PRN
  Administered 2018-07-14: 50 mg via ORAL
  Filled 2018-07-14: qty 2

## 2018-07-14 MED ORDER — PROPOFOL 500 MG/50ML IV EMUL
INTRAVENOUS | Status: DC | PRN
  Administered 2018-07-14: 50 ug/kg/min via INTRAVENOUS

## 2018-07-14 MED ORDER — OXYCODONE HCL 5 MG PO TABS
ORAL_TABLET | ORAL | Status: AC
  Filled 2018-07-14: qty 2

## 2018-07-14 MED ORDER — SODIUM CHLORIDE 0.9% FLUSH: 3.0000 mL | Freq: Two times a day (BID) | INTRAVENOUS | Status: DC

## 2018-07-14 MED ORDER — PROPOFOL 10 MG/ML IV BOLUS
INTRAVENOUS | Status: DC | PRN
  Administered 2018-07-14: 200 mg via INTRAVENOUS

## 2018-07-14 MED ORDER — LACTATED RINGERS IV SOLN
INTRAVENOUS | Status: DC
  Administered 2018-07-14: 14:00:00 via INTRAVENOUS

## 2018-07-14 MED ORDER — LACTATED RINGERS IV SOLN
INTRAVENOUS | Status: DC | PRN
  Administered 2018-07-14: 07:00:00 via INTRAVENOUS

## 2018-07-14 MED ORDER — SODIUM CHLORIDE 0.9 % IV SOLN
INTRAVENOUS | Status: DC | PRN
  Administered 2018-07-14: 30 ug/min via INTRAVENOUS
  Administered 2018-07-14: 15 ug/min via INTRAVENOUS

## 2018-07-14 MED ORDER — THROMBIN 20000 UNITS EX SOLR
CUTANEOUS | Status: DC | PRN
  Administered 2018-07-14: 20 mL via TOPICAL

## 2018-07-14 MED ORDER — HYDROCODONE-ACETAMINOPHEN 7.5-325 MG PO TABS: 1.0000 | ORAL_TABLET | Freq: Once | ORAL | Status: DC | PRN

## 2018-07-14 MED ORDER — VANCOMYCIN HCL 10 G IV SOLR
1500.0000 mg | INTRAVENOUS | Status: AC
  Administered 2018-07-14: 1500 mg via INTRAVENOUS
  Filled 2018-07-14: qty 1500

## 2018-07-14 MED ORDER — MIDAZOLAM HCL 5 MG/5ML IJ SOLN
INTRAMUSCULAR | Status: DC | PRN
  Administered 2018-07-14: 1 mg via INTRAVENOUS

## 2018-07-14 MED FILL — Thrombin (Recombinant) For Soln 20000 Unit: CUTANEOUS | Qty: 1 | Status: AC

## 2018-07-14 SURGICAL SUPPLY — 74 items
BLADE CLIPPER SURG (BLADE) IMPLANT
CABLE BIPOLOR RESECTION CORD (MISCELLANEOUS) ×3 IMPLANT
CLIP NEUROVISION LG (CLIP) ×2 IMPLANT
CLOSURE STERI-STRIP 1/2X4 (GAUZE/BANDAGES/DRESSINGS) ×1
CLSR STERI-STRIP ANTIMIC 1/2X4 (GAUZE/BANDAGES/DRESSINGS) ×2 IMPLANT
COVER BACK TABLE 80X110 HD (DRAPES) ×2 IMPLANT
COVER SURGICAL LIGHT HANDLE (MISCELLANEOUS) ×3 IMPLANT
DRAPE C-ARM 42X72 X-RAY (DRAPES) ×3 IMPLANT
DRAPE C-ARMOR (DRAPES) ×3 IMPLANT
DRAPE POUCH INSTRU U-SHP 10X18 (DRAPES) ×3 IMPLANT
DRAPE SURG 17X23 STRL (DRAPES) ×3 IMPLANT
DRAPE U-SHAPE 47X51 STRL (DRAPES) ×3 IMPLANT
DRSG OPSITE POSTOP 4X8 (GAUZE/BANDAGES/DRESSINGS) ×5 IMPLANT
DURAPREP 26ML APPLICATOR (WOUND CARE) ×3 IMPLANT
ELECT BLADE 4.0 EZ CLEAN MEGAD (MISCELLANEOUS)
ELECT BLADE 6.5 EXT (BLADE) ×3 IMPLANT
ELECT CAUTERY BLADE 6.4 (BLADE) ×3 IMPLANT
ELECT PENCIL ROCKER SW 15FT (MISCELLANEOUS) ×3 IMPLANT
ELECT REM PT RETURN 9FT ADLT (ELECTROSURGICAL) ×3
ELECTRODE BLDE 4.0 EZ CLN MEGD (MISCELLANEOUS) IMPLANT
ELECTRODE REM PT RTRN 9FT ADLT (ELECTROSURGICAL) ×1 IMPLANT
GLOVE BIOGEL PI IND STRL 8.5 (GLOVE) ×1 IMPLANT
GLOVE BIOGEL PI INDICATOR 8.5 (GLOVE) ×2
GLOVE SS BIOGEL STRL SZ 8.5 (GLOVE) ×1 IMPLANT
GLOVE SUPERSENSE BIOGEL SZ 8.5 (GLOVE) ×2
GOWN STRL REUS W/ TWL LRG LVL3 (GOWN DISPOSABLE) ×1 IMPLANT
GOWN STRL REUS W/TWL 2XL LVL3 (GOWN DISPOSABLE) ×6 IMPLANT
GOWN STRL REUS W/TWL LRG LVL3 (GOWN DISPOSABLE) ×3
GUIDEWIRE NITINOL BEVEL TIP (WIRE) ×12 IMPLANT
KIT BASIN OR (CUSTOM PROCEDURE TRAY) ×3 IMPLANT
KIT NDL NVM5 EMG ELECT (KITS) IMPLANT
KIT NEEDLE NVM5 EMG ELECT (KITS) ×2 IMPLANT
KIT NEEDLE NVM5 EMG ELECTRODE (KITS) ×1
KIT POSITION SURG JACKSON T1 (MISCELLANEOUS) ×3 IMPLANT
KIT TURNOVER KIT B (KITS) ×3 IMPLANT
MODULE EMG NDL SSEP NVM5 (NEEDLE) IMPLANT
MODULE EMG NEEDLE SSEP NVM5 (NEEDLE) ×3 IMPLANT
NDL SPNL 18GX3.5 QUINCKE PK (NEEDLE) ×1 IMPLANT
NEEDLE 22X1 1/2 (OR ONLY) (NEEDLE) ×3 IMPLANT
NEEDLE SPNL 18GX3.5 QUINCKE PK (NEEDLE) ×3 IMPLANT
NS IRRIG 1000ML POUR BTL (IV SOLUTION) ×3 IMPLANT
PACK LAMINECTOMY ORTHO (CUSTOM PROCEDURE TRAY) ×3 IMPLANT
PACK UNIVERSAL I (CUSTOM PROCEDURE TRAY) ×3 IMPLANT
PAD ARMBOARD 7.5X6 YLW CONV (MISCELLANEOUS) ×6 IMPLANT
PATTIES SURGICAL .5 X.5 (GAUZE/BANDAGES/DRESSINGS) IMPLANT
PATTIES SURGICAL .5 X1 (DISPOSABLE) ×3 IMPLANT
POSITIONER HEAD PRONE TRACH (MISCELLANEOUS) ×3 IMPLANT
PROBE BALL TIP NVM5 SNG USE (BALLOONS) ×2 IMPLANT
ROD RELINE MAS LORD 5.5X85MM (Rod) ×4 IMPLANT
ROD SPINAL 5.5X80 TI LORDOSE (Rod) ×2 IMPLANT
SCREW LOCK RELINE 5.5 TULIP (Screw) ×12 IMPLANT
SCREW RELINE RED 6.5X45MM POLY (Screw) ×2 IMPLANT
SCREW RELINE RED 6.5X50MM POLY (Screw) ×12 IMPLANT
SPONGE LAP 4X18 RFD (DISPOSABLE) ×6 IMPLANT
SPONGE SURGIFOAM ABS GEL 100 (HEMOSTASIS) ×3 IMPLANT
SURGIFLO W/THROMBIN 8M KIT (HEMOSTASIS) IMPLANT
SUT BONE WAX W31G (SUTURE) ×3 IMPLANT
SUT MNCRL AB 3-0 PS2 18 (SUTURE) ×6 IMPLANT
SUT MON AB 3-0 SH 27 (SUTURE) ×3
SUT MON AB 3-0 SH27 (SUTURE) IMPLANT
SUT VIC AB 1 CT1 18XCR BRD 8 (SUTURE) ×1 IMPLANT
SUT VIC AB 1 CT1 8-18 (SUTURE) ×3
SUT VIC AB 1 CTX 36 (SUTURE)
SUT VIC AB 1 CTX36XBRD ANBCTR (SUTURE) IMPLANT
SUT VIC AB 2-0 CT1 18 (SUTURE) ×3 IMPLANT
SUT VIC AB 2-0 CT1 27 (SUTURE) ×3
SUT VIC AB 2-0 CT1 TAPERPNT 27 (SUTURE) IMPLANT
SYR BULB IRRIGATION 50ML (SYRINGE) ×3 IMPLANT
SYR CONTROL 10ML LL (SYRINGE) ×3 IMPLANT
TOWEL GREEN STERILE (TOWEL DISPOSABLE) ×3 IMPLANT
TOWEL GREEN STERILE FF (TOWEL DISPOSABLE) ×3 IMPLANT
TRAY FOLEY MTR SLVR 16FR STAT (SET/KITS/TRAYS/PACK) ×3 IMPLANT
WATER STERILE IRR 1000ML POUR (IV SOLUTION) ×3 IMPLANT
YANKAUER SUCT BULB TIP NO VENT (SUCTIONS) ×3 IMPLANT

## 2018-07-14 NOTE — Brief Op Note (Signed)
07/13/2018 - 07/14/2018  9:28 AM  PATIENT:  Jesse HeadingHubbard D Malanowski Jr.  77 y.o. male  PRE-OPERATIVE DIAGNOSIS:  Degenerative spinal stenosis, degenerative disc disease, radiculopathy  POST-OPERATIVE DIAGNOSIS:  Degenerative spinal stenosis, degenerative disc disease, radiculopathy  PROCEDURE:  Procedure(s) with comments: Posterior spinal fusion interbody L2-4 (N/A) - 4 hrs  SURGEON:  Surgeon(s) and Role:    Venita Lick* Keni Wafer, MD - Primary  PHYSICIAN ASSISTANT:   ASSISTANTS: Carmen Mayo   ANESTHESIA:   general  EBL:  100 mL   BLOOD ADMINISTERED:none  DRAINS: none   LOCAL MEDICATIONS USED:  MARCAINE     SPECIMEN:  No Specimen  DISPOSITION OF SPECIMEN:  N/A  COUNTS:  YES  TOURNIQUET:  * No tourniquets in log *  DICTATION: .Dragon Dictation  PLAN OF CARE: Admit to inpatient   PATIENT DISPOSITION:  PACU - hemodynamically stable.

## 2018-07-14 NOTE — Op Note (Signed)
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  No adverse EMG or SSEP events throughout the case.  Implants: Nuvasive MIS pedicle screw fixation.  6.5 x 50 mm screws utilized at all levels.  Indications: This is a very pleasant 77 year old gentleman who underwent a lateral interbody fusion L2-3 L3-4 yesterday and returns today for a planned second stage procedure.  Preoperatively he was noted to have improvement in the neuropathic pain that caused significant disability for him.  As a result of the success of the indirect decompression provided by the lateral interbody fusion I elected to move forward with supplemental pedicle screw fixation without decompression today.  This was explained to the patient and his family preoperatively and he was in agreement with the plan.  All appropriate risks benefits and alternatives were discussed with the patient including the potential to return to the OR at a later date if the neuropathic pain returns.  Operative report:  Patient was brought the operating room placed by the operating room table.  After successful induction of general anesthesia and endotracheal intubation teds SCDs were applied and the neuro monitoring rep applied all the appropriate monitors.  Patient was turned prone onto the [facility] spine frame.  All bony prominences are well-padded and the back was then prepped and draped in a standard fashion.  Timeout was taken to confirm patient procedure and all other important data.  Once this was completed we began the surgical procedure.  Using fluoroscopic guidance identified the lateral borders of the L2, L3, and L4 pedicles  bilaterally.  I then infiltrated these areas with quarter percent Marcaine with epinephrine.  Starting on the left-hand side I made an incision over the lateral border of the L4 pedicle and advanced the Jamshidi needle percutaneously down to the lateral aspect of the pedicle.  Once I confirmed satisfactory position in the AP plane I then connected the Jamshidi needle to the neuro monitoring device and then using fluoroscopy as well as neuro monitoring I advanced the Jamshidi needle into the pedicle of L4.  Once I was nearing the medial wall of the pedicle is seen on the AP view I switched my fluoroscopy view to the lateral.  In the lateral I confirmed that I was just beyond the posterior wall the vertebral body confirming at satisfactory trajectory and depth.  In addition there was no abnormal free running EMGs to suggest pedicle breach.  I then advanced the Jamshidi needle into the vertebral body.  I then placed my guidepin and remove the Jamshidi needle.  I then confirmed in both the AP and lateral plane that the trajectory in final position of the guidepin was satisfactory.  Once this was done I repeated this exact same procedure at L3 and L2 on the left-hand side once all 3 pedicles were cannulated I then went back to the right-hand side and utilize the same technique to cannulate the L2, L3, and L4 pedicles on the side.  Once all 6 pedicles were cannulated and I confirmed satisfactory position of the guide pins in both planes I then proceeded with placing the screws.  I obtained a 6.5 diameter 50 mm screw after measuring and placed it over the guidepin down to the lateral aspect of the facet complex I then  advanced the screw over the guidepin into the pedicle.  Again while placing the screw I was directly stimulating it and monitoring its trajectory with fluoroscopy.  Once I was beyond the posterior wall the vertebral body I remove the guidepin and advanced the screw to its final depth.  I repeated this exact  same procedure at L3 and L4 once all 3 pedicle screws were placed on one side I took AP and lateral fluoroscopy views to confirm satisfactory positioning and depth of the screws.  I then directly stimulated the screws and there was no adverse activity greater than 40 mA of stimulation.  Once I completed this side I went to the opposite side and repeated the exact same procedure placing all 3 pedicle screws on this side.  Again final trajectory and position was satisfactory on the fluoroscopy views and all screws failed to produce any abnormal EMG activity at greater than 40 mA of stimulation.  At this point time with all 6 pedicle screws in place I measured and then passed the rod percutaneously.  Once I confirmed that I was properly positioned for the rod I then placed the locking caps and then torqued the locking caps to lock the rod in place.  All 3 screws were locked up for port and manufacture standards.  I then remove the inserting device for the rod and remove the percutaneous tabs.  I repeated this on the contralateral side.  At this point I had adequate posterior pedicle screw fixatio supplementing dural interbody fusion that was done the day prior.  At this point all wounds were copiously irrigated with normal saline and closed in a layered fashion with interrupted #1 Vicryl suture, 2-0 Vicryl suture, and 3-0 Monocryl.  Steri-Strips and dry dressings were applied.  Patient was ultimately extubated transfer the PACU without incident.  The end of the case all needle sponge counts were correct.  There are no adverse intraoperative events.

## 2018-07-14 NOTE — Anesthesia Procedure Notes (Signed)
Procedure Name: Intubation Date/Time: 07/14/2018 7:46 AM Performed by: Fransisca KaufmannMeyer, Sherrina Zaugg E, CRNA Pre-anesthesia Checklist: Patient identified, Emergency Drugs available, Suction available and Patient being monitored Patient Re-evaluated:Patient Re-evaluated prior to induction Oxygen Delivery Method: Circle System Utilized Preoxygenation: Pre-oxygenation with 100% oxygen Induction Type: IV induction Ventilation: Mask ventilation without difficulty Laryngoscope Size: Miller and 3 Grade View: Grade II Tube type: Oral Tube size: 7.5 mm Number of attempts: 1 Airway Equipment and Method: Stylet and Oral airway Placement Confirmation: ETT inserted through vocal cords under direct vision,  positive ETCO2 and breath sounds checked- equal and bilateral Secured at: 23 cm Tube secured with: Tape Dental Injury: Teeth and Oropharynx as per pre-operative assessment  Comments: DL completed by Herbie DrapeAniesha Dukkipati, SRNA

## 2018-07-14 NOTE — Progress Notes (Signed)
PT Cancellation Note  Patient Details Name: Jesse [name] D Alms Jr. MRN: [id number] DOB: 1941-11-10   Cancelled Treatment:    Reason Eval/Treat Not Completed: Patient at procedure or test/unavailable. Pt currently in surgery. Will continue to follow and initiate PT evaluation when pt is available and appropriate for mobility.   Marylynn PearsonLaura D Lavonia Eager 07/14/2018, 10:28 AM   Conni SlipperLaura Mehak Roskelley, PT, DPT Acute Rehabilitation Services Pager: [phone]

## 2018-07-14 NOTE — Anesthesia Postprocedure Evaluation (Signed)
Anesthesia Post Note  Patient: Jesse HeadingHubbard D Trani Jr.  Procedure(s) Performed: Posterior spinal fusion interbody L2-4 (N/A Spine Lumbar)     Patient location during evaluation: PACU Anesthesia Type: General Level of consciousness: awake and alert Pain management: pain level controlled Vital Signs Assessment: post-procedure vital signs reviewed and stable Respiratory status: spontaneous breathing, nonlabored ventilation, respiratory function stable and patient connected to nasal cannula oxygen Cardiovascular status: blood pressure returned to baseline and stable Postop Assessment: no apparent nausea or vomiting Anesthetic complications: no    Last Vitals:  Vitals:   07/14/18 1130 07/14/18 1138  BP:  (!) 180/78  Pulse:  (!) 55  Resp: 15 20  Temp: (!) 36.1 C 36.6 C  SpO2:  96%    Last Pain:  Vitals:   07/14/18 1404  TempSrc:   PainSc: 7                  Trevor IhaStephen A Houser

## 2018-07-14 NOTE — Progress Notes (Signed)
07/14/18 1612  PT Visit Information  Last PT Received On 07/14/18  Assistance Needed +1  History of Present Illness Merdis DelayHubbard Stansell is a 77 y.o. M s/p 2 step posterior and lateral L2-L4 fusion. PMH includes Arthrtis, GERD, HLD, HTN, Shoulder Surgery, Fatty Liver, Kidney stones, Migraines.  Precautions  Precautions Back  Precaution Booklet Issued Yes (comment)  Precaution Comments Pt and wife educated on precautions and generalized walking program  Required Braces or Orthoses Spinal Brace  Spinal Brace Applied in sitting position;Lumbar corset  Restrictions  Weight Bearing Restrictions No  Home Living  Family/patient expects to be discharged to: Private residence  Living Arrangements Spouse/significant other  Available Help at Discharge Available 24 hours/day;Family  Type of Home House  Home Access Stairs to enter  Entrance Stairs-Number of Steps 3  Entrance Stairs-Rails None  Home Layout Two level  Alternate Level Stairs-Number of Steps 14  Alternate Level Stairs-Rails Left  Bathroom Shower/Tub Walk-in [employer]ension scheme managershower  Bathroom Toilet Standard  Bathroom Accessibility Yes  Home Equipment Shower seat - built in  Prior Function  Level of Independence Independent  Comments Pt reports being able to get around, but was limited by pain  Communication  Communication Other (comment);HOH (Wears hearing aids)  Pain Assessment  Pain Assessment Faces  Faces Pain Scale 6  Pain Location Back  Pain Descriptors / Indicators Grimacing;Guarding  Pain Intervention(s) Limited activity within patient's tolerance;Monitored during session;Repositioned  Cognition  Arousal/Alertness Awake/alert  [facility] for tasks assessed/performed  Overall Cognitive Status Within Functional Limits for tasks assessed  General Comments Pt is a little slow to respond to questions, and sometimes needs instructions repeated.   Upper Extremity Assessment  Upper Extremity Assessment Defer to OT evaluation   Lower Extremity Assessment  Lower Extremity Assessment Generalized weakness;RLE deficits/detail  RLE Deficits / Details Pt reports improved pain in R LE from baseline  Cervical / Trunk Assessment  Cervical / Trunk Assessment Other exceptions  Cervical / Trunk Exceptions s/p Lumbar Fusion  Bed Mobility  Overal bed mobility Needs Assistance  Bed Mobility Supine to Sit  Supine to sit Supervision  General bed mobility comments Pt required increased time for bed mobility. Pt required repeated cues for sequencing and hand placement. Pt had some difficulty pushing up fully, but required no physical assist.  Transfers  Overall transfer level Needs assistance  Equipment used Rolling walker (2 wheeled);2 person hand held assist  Transfers Sit to/from Stand  Sit to Stand Min assist;Min guard  General transfer comment Pt required MinA for initial power up of sit<>stand and for steadying upon standing, and braced LE against the bed for balance. Pt had some sway once standing. Pt reported no dizziness upon standing, but seemed unsteady. Upon returning to bed, pt was educated on safe use of RW during transfers and how to follow precautions during sit<>stand.  Ambulation/Gait  Ambulation/Gait assistance Min guard  Gait Distance (Feet) 75 Feet  Assistive device Rolling walker (2 wheeled)  Gait Pattern/deviations Step-through pattern;Decreased stride length;Shuffle;Narrow base of support  General Gait Details Pt had unsteadiness during ambulation, small steps, a narrow base of support, and presented with a step-through pattern. Pt was cued to stand tall and keep RW close. Pt was able to perform static standing with RW and perform Single leg marches. Pt moved slowly and was very guarded of back.  Balance  Overall balance assessment Needs assistance  Sitting-balance support No upper extremity supported;Feet supported  Sitting balance-Leahy Scale Fair  Sitting balance - Comments Pt  was able to sit at EOB and  apply brace while sitting  Standing balance support No upper extremity supported  Standing balance-Leahy Scale Fair  Standing balance comment Pt was able to perform static standing without UE support while washing hands, but had some unsteadiness during toileting with AP sway.   General Comments  General comments (skin integrity, edema, etc.) Pt wears hearing aids  PT - End of Session  Equipment Utilized During Treatment Gait belt;Back brace  Activity Tolerance Patient limited by pain;Patient limited by fatigue  Patient left in bed;with call bell/phone within reach;with family/visitor present;Other (comment) (Sitting EOB)  Nurse Communication Mobility status  PT Assessment  PT Recommendation/Assessment Patient needs continued PT services  PT Visit Diagnosis Unsteadiness on feet (R26.81);Other abnormalities of gait and mobility (R26.89);Muscle weakness (generalized) (M62.81);Difficulty in walking, not elsewhere classified (R26.2);Pain  Pain - part of body  (Back)  PT Problem List Decreased strength;Decreased range of motion;Decreased activity tolerance;Decreased balance;Decreased mobility;Decreased knowledge of precautions;Decreased knowledge of use of DME;Pain  PT Plan  PT Frequency (ACUTE ONLY) Min 5X/week  PT Treatment/Interventions (ACUTE ONLY) DME instruction;Gait training;Stair training;Functional mobility training;Therapeutic activities;Therapeutic exercise;Balance training;Patient/family education  AM-PAC PT "6 Clicks" Daily Activity Outcome Measure  Difficulty turning over in bed (including adjusting bedclothes, sheets and blankets)? 2  Difficulty moving from lying on back to sitting on the side of the bed?  2  Difficulty sitting down on and standing up from a chair with arms (e.g., wheelchair, bedside commode, etc,.)? 1  Help needed moving to and from a bed to chair (including a wheelchair)? 3  Help needed walking in hospital room? 3  Help needed climbing 3-5 steps with a railing?   2  6 Click Score 13  Mobility G Code  CK  PT Recommendation  Recommendations for Other Services OT consult  Follow Up Recommendations No PT follow up;Supervision for mobility/OOB  PT equipment Rolling walker with 5" wheels  Individuals Consulted  Consulted and Agree with Results and Recommendations Patient  Acute Rehab PT Goals  Patient Stated Goal To use his chainsaw again  PT Goal Formulation With patient  Time For Goal Achievement 07/28/18  Potential to Achieve Goals Good  PT Time Calculation  PT Start Time (ACUTE ONLY) 1540  PT Stop Time (ACUTE ONLY) 1609  PT Time Calculation (min) (ACUTE ONLY) 29 min  PT General Charges  $$ ACUTE PT VISIT 1 Visit  PT Evaluation  $PT Eval Low Complexity 1 Low  PT Treatments  $Gait Training 8-22 mins  Written Expression  Dominant Hand Left (Pt reports using R and L for ADLs)    Pt presents with problems above and deficits below. Pt and wife educated on precautions and generalized walking program. Pt performed bed mobility with Supervision and some difficulty. Pt required MinA for sit<>stand, and ambulated with a RW and MinG due to unsteadiness. Pt is limited by pain and unsteadiness. Will continue to follow acutely to support independence, mobility, and safety.   Demetria Pore, S-DPT Acute Care Rehab Student [phone]

## 2018-07-14 NOTE — Transfer of Care (Signed)
Immediate Anesthesia Transfer of Care Note  Patient: Jesse HeadingHubbard D Ellerson Jr.  Procedure(s) Performed: Posterior spinal fusion interbody L2-4 (N/A Spine Lumbar)  Patient Location: PACU  Anesthesia Type:General  Level of Consciousness: awake, alert , oriented and sedated  Airway & Oxygen Therapy: Patient Spontanous Breathing and Patient connected to nasal cannula oxygen  Post-op Assessment: Report given to RN, Post -op Vital signs reviewed and stable and Patient moving all extremities X 4  Post vital signs: Reviewed and stable  Last Vitals:  Vitals Value Taken Time  BP 160/98 07/14/2018 10:18 AM  Temp    Pulse 57 07/14/2018 10:21 AM  Resp 13 07/14/2018 10:21 AM  SpO2 98 % 07/14/2018 10:21 AM  Vitals shown include unvalidated device data.  Last Pain:  Vitals:   07/14/18 0425  TempSrc: Oral  PainSc:       Patients Stated Pain Goal: 3 (07/13/18 2125)  Complications: No apparent anesthesia complications

## 2018-07-14 NOTE — Evaluation (Signed)
Occupational Therapy Evaluation Patient Details Name: Jesse [name]. MRN: [id number] DOB: 07-07-1941 Today's Date: 07/14/2018    History of Present Illness Jesse [name] is a 77 y.o. M s/p posterior and lateral L2-L4 fusion. PMH includes Arthrtis, GERD, HLD, HTN, Shoulder Surgery, Fatty Liver, Kidney stones, Migraines.   Clinical Impression   PTA, pt was living with his wife and was independent. Pt currently requiring Min A for LB ADLs with AE and Min A for functional mobility. Providing education on back precautions, bed mobility, LB ADLs with AE, and toileting. Pt would benefit from further acute OT to facilitate safe dc. Recommend dc to home once medically stable per physician.   Follow Up Recommendations  No OT follow up;Supervision/Assistance - 24 hour    Equipment Recommendations  3 in 1 bedside commode    Recommendations for Other Services PT consult     Precautions / Restrictions Precautions Precautions: Back Precaution Booklet Issued: Yes (comment) Precaution Comments: Pt and wife educated on precautions and generalized walking program Required Braces or Orthoses: Spinal Brace Spinal Brace: Applied in sitting position;Lumbar corset Restrictions Weight Bearing Restrictions: No      Mobility Bed Mobility Overal bed mobility: Needs Assistance Bed Mobility: Supine to Sit;Sit to Supine     Supine to sit: Supervision Sit to supine: Supervision   General bed mobility comments: Pt required increased time for bed mobility. Pt required repeated cues for sequencing and hand placement. Pt had some difficulty pushing up fully, but required no physical assist.  Transfers Overall transfer level: Needs assistance Equipment used: 1 person hand held assist Transfers: Sit to/from Stand Sit to Stand: Min assist         General transfer comment: Pt required MinA for initial power up of sit<>stand and for steadying upon standing. Pt had some sway once standing.     Balance Overall balance assessment: Needs assistance Sitting-balance support: No upper extremity supported;Feet supported Sitting balance-Leahy Scale: Fair Sitting balance - Comments: Pt was able to sit at EOB and apply brace while sitting   Standing balance support: No upper extremity supported Standing balance-Leahy Scale: Fair Standing balance comment: Pt was able to perform static standing without UE support while washing hands, but had some unsteadiness during toileting with AP sway.                            ADL either performed or assessed with clinical judgement   ADL Overall ADL's : Needs assistance/impaired Eating/Feeding: Set up;Sitting   Grooming: Min guard;Standing   Upper Body Bathing: Set up;Supervision/ safety;Sitting   Lower Body Bathing: Minimal assistance;Sit to/from stand;With adaptive equipment   Upper Body Dressing : Set up;Supervision/safety;Sitting Upper Body Dressing Details (indicate cue type and reason): Pt and wife reporting they feel comfortable donning/doffing brace Lower Body Dressing: Minimal assistance;Sit to/from stand Lower Body Dressing Details (indicate cue type and reason): Pt able to bring right ankles to left knee but very stiffly. Pt unable to bring left ankle to right knee. Educating pt and wife on use of AE for donning/doffing socks and underwear. Pt donning underwear with AE and Min A for power up into standing. Pt donning/doffing socks with AE at EOB Toilet Transfer: Minimal assistance(simulated in room)           Functional mobility during ADLs: Minimal assistance(sit<>stand only due to recent mobility and pain) General ADL Comments: Providing education on bed mobility, back precautions, LB ADLs, and toileting.  Vision Baseline Vision/History: Wears glasses Patient Visual Report: No change from baseline       Perception     Praxis      Pertinent Vitals/Pain Pain Assessment: Faces Faces Pain Scale: Hurts even  more Pain Location: Back Pain Descriptors / Indicators: Grimacing;Guarding Pain Intervention(s): Monitored during session;Limited activity within patient's tolerance;Repositioned     Hand Dominance Left(Pt reports using R and L for ADLs)   Extremity/Trunk Assessment Upper Extremity Assessment Upper Extremity Assessment: Overall WFL for tasks assessed   Lower Extremity Assessment Lower Extremity Assessment: Defer to PT evaluation   Cervical / Trunk Assessment Cervical / Trunk Assessment: Other exceptions Cervical / Trunk Exceptions: s/p Lumbar Fusion   Communication Communication Communication: HOH;Other (comment)(Wears hearing aids)   Cognition Arousal/Alertness: Awake/alert Behavior During Therapy: WFL for tasks assessed/performed Overall Cognitive Status: Within Functional Limits for tasks assessed                                 General Comments: Pt is a little slow to respond to questions, and sometimes needs instructions repeated.    General Comments  Wife present throughout session    Exercises     Shoulder Instructions      Home Living Family/patient expects to be discharged to:: Private residence Living Arrangements: Spouse/significant other Available Help at Discharge: Available 24 hours/day;Family Type of Home: House Home Access: Stairs to enter Entergy CorporationEntrance Stairs-Number of Steps: 3 Entrance Stairs-Rails: None Home Layout: Two level Alternate Level Stairs-Number of Steps: 14 Alternate Level Stairs-Rails: Left Bathroom Shower/Tub: Producer, television/film/videoWalk-in shower   Bathroom Toilet: Standard Bathroom Accessibility: Yes   Home Equipment: Shower seat - built in          Prior Functioning/Environment Level of Independence: Independent        Comments: ADLs and IADLs        OT Problem List: Decreased strength;Decreased range of motion;Decreased activity tolerance;Impaired balance (sitting and/or standing);Decreased knowledge of use of DME or  AE;Decreased knowledge of precautions;Pain      OT Treatment/Interventions: Self-care/ADL training;Therapeutic exercise;Energy conservation;DME and/or AE instruction;Therapeutic activities;Patient/family education    OT Goals(Current goals can be found in the care plan section) Acute Rehab OT Goals Patient Stated Goal: To use his chainsaw again OT Goal Formulation: With patient Time For Goal Achievement: 07/28/18 Potential to Achieve Goals: Good ADL Goals Pt Will Perform Lower Body Dressing: with modified independence;with caregiver independent in assisting;sit to/from stand;with adaptive equipment Pt Will Transfer to Toilet: with modified independence;ambulating;bedside commode Pt Will Perform Tub/Shower Transfer: Shower transfer;with min guard assist;ambulating;3 in 1;rolling walker Additional ADL Goal #1: Pt will perform log roll at Mod I level in preparation for ADLs  OT Frequency: Min 2X/week   Barriers to D/C:            Co-evaluation              AM-PAC PT "6 Clicks" Daily Activity     Outcome Measure Help from another person eating meals?: None Help from another person taking care of personal grooming?: A Little Help from another person toileting, which includes using toliet, bedpan, or urinal?: A Little Help from another person bathing (including washing, rinsing, drying)?: A Little Help from another person to put on and taking off regular upper body clothing?: A Little Help from another person to put on and taking off regular lower body clothing?: A Little 6 Click Score: 19   End of Session Equipment Utilized  During Treatment: Gait belt Nurse Communication: Mobility status;Precautions  Activity Tolerance: Patient tolerated treatment well;Patient limited by pain Patient left: in bed;with call bell/phone within reach;with family/visitor present;with SCD's reapplied  OT Visit Diagnosis: Unsteadiness on feet (R26.81);Other abnormalities of gait and mobility  (R26.89);Muscle weakness (generalized) (M62.81);Pain Pain - part of body: (Back)                Time: 8469-6295 OT Time Calculation (min): 22 min Charges:  OT General Charges $OT Visit: 1 Visit OT Evaluation $OT Eval Low Complexity: 1 Low  Dardan Shelton MSOT, OTR/L Acute Rehab Pager: [phone] Office: [phone]  Theodoro Grist Ronel Rodeheaver 07/14/2018, 5:38 PM

## 2018-07-14 NOTE — Progress Notes (Signed)
OT Cancellation Note  Patient Details Name: Jesse HeadingHubbard D Behrend Jr. MRN: [id number] DOB: 1941/02/16   Cancelled Treatment:    Reason Eval/Treat Not Completed: Patient at procedure or test/ unavailable.  Pt currently in surgery.   Will reattempt.  Nevelyn Mellott [place], OTR/L [phone]   [name], [name] 07/14/2018, 8:19 AM

## 2018-07-14 NOTE — Progress Notes (Signed)
Pharmacy Antibiotic Note  Jesse HeadingHubbard D Dugger Jr. is a 77 y.o. male admitted on 07/13/2018 for planned spinal surgery.  Pharmacy has been consulted for Vancomycin dosing post-op.   Per discussion with RN - no drain in place. Pre-op dose given at 0805 today.   It is noted the patient also had a dose yesterday at 1430 pre-op and at 0230 this AM post-op. Will give a lower dose this evening due to the accumulation of the previous doses.   Plan: - Vancomycin 500 mg IV x 1 at 2000 today - Pharmacy will sign off as no further doses expected at this time.   Height: 6' (182.9 cm) Weight: 218 lb (98.9 kg) IBW/kg (Calculated) : 77.6  Temp (24hrs), Avg:97.7 F (36.5 C), Min:97 F (36.1 C), Max:98.3 F (36.8 C)  No results for input(s): WBC, CREATININE, LATICACIDVEN, VANCOTROUGH, VANCOPEAK, VANCORANDOM, GENTTROUGH, GENTPEAK, GENTRANDOM, TOBRATROUGH, TOBRAPEAK, TOBRARND, AMIKACINPEAK, AMIKACINTROU, AMIKACIN in the last 168 hours.  Estimated Creatinine Clearance: 82.8 mL/min (by C-G formula based on SCr of 0.91 mg/dL).    Allergies  Allergen Reactions  . Penicillins Rash    Thank you for allowing pharmacy to be a part of this patient's care.  Georgina PillionElizabeth Tiamarie Furnari, PharmD, BCPS Pager: [phone] 11:40 AM

## 2018-07-14 NOTE — Progress Notes (Signed)
    Subjective: Procedure(s) (LRB): Posterior spinal fusion interbody L2-4, possible decompression L2-4 (N/A) Day of Surgery  Patient reports pain as 2 on 0-10 scale.  Reports decreased leg pain reports incisional back pain   N/A void - foley in place Negative bowel movement Positive flatus Negative chest pain or shortness of breath  Objective: Vital signs in last 24 hours: Temp:  [97.5 F (36.4 C)-98.3 F (36.8 C)] 97.7 F (36.5 C) (08/08 0425) Pulse Rate:  [56-73] 60 (08/08 0425) Resp:  [12-20] 18 (08/08 0425) BP: (141-182)/(59-83) 141/71 (08/08 0425) SpO2:  [96 %-100 %] 98 % (08/08 0425) Weight:  [98.9 kg-100.2 kg] 98.9 kg (08/07 1114)  Intake/Output from previous day: 08/07 0701 - 08/08 0700 In: 2600 [I.V.:1900; IV Piggyback:700] Out: 3200 [Urine:3050; Blood:150]  Labs: No results for input(s): WBC, RBC, HCT, PLT in the last 72 hours. No results for input(s): NA, K, CL, CO2, BUN, CREATININE, GLUCOSE, CALCIUM in the last 72 hours. No results for input(s): LABPT, INR in the last 72 hours.  Physical Exam: Neurologically intact Intact pulses distally Incision: dressing C/D/I and no drainage Compartment soft neuropathic leg pain improved Body mass index is 29.57 kg/m.  Complains of left hip/groin pain only   Assessment/Plan: Patient stable  xrays n/a Continue mobilization with physical therapy Continue care  Patient neuropathic leg pain improved.  Groin pain to be expected due to trans-psoas approach. 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  No need at present to consider additional decompression. Discussed with patient and family - all are in agreement with surgical plan  Venita Lickahari Tanairy Payeur, MD [facility] [phone],

## 2018-07-15 ENCOUNTER — Encounter (HOSPITAL_COMMUNITY): Payer: Self-pay | Admitting: Orthopedic Surgery

## 2018-07-15 DIAGNOSIS — M5415 Radiculopathy, thoracolumbar region: Secondary | ICD-10-CM | POA: Insufficient documentation

## 2018-07-15 DIAGNOSIS — M5116 Intervertebral disc disorders with radiculopathy, lumbar region: Secondary | ICD-10-CM | POA: Insufficient documentation

## 2018-07-15 HISTORY — DX: Radiculopathy, thoracolumbar region: M54.15

## 2018-07-15 HISTORY — DX: Intervertebral disc disorders with radiculopathy, lumbar region: M51.16

## 2018-07-15 MED FILL — Thrombin (Recombinant) For Soln 20000 Unit: CUTANEOUS | Qty: 1 | Status: AC

## 2018-07-15 NOTE — Progress Notes (Signed)
    Subjective: 1 Day Post-Op Procedure(s) (LRB): Posterior spinal fusion interbody L2-4 (N/A) Patient reports pain as 5 on 0-10 scale.  Incisional pain Denies CP or SOB.  Voiding without difficulty. Positive flatus. Pt ambulating in hallway with use of walker Objective: Vital signs in last 24 hours: Temp:  [97 F (36.1 C)-98.3 F (36.8 C)] 98.1 F (36.7 C) (08/09 0354) Pulse Rate:  [54-68] 67 (08/09 0354) Resp:  [10-20] 20 (08/09 0354) BP: (144-186)/(67-98) 151/67 (08/09 0354) SpO2:  [95 %-100 %] 98 % (08/09 0354)  Intake/Output from previous day: 08/08 0701 - 08/09 0700 In: 1860.5 [P.O.:240; I.V.:1020.5; IV Piggyback:600] Out: 1150 [Urine:1100; Blood:50] Intake/Output this shift: No intake/output data recorded.  Labs: No results for input(s): HGB in the last 72 hours. No results for input(s): WBC, RBC, HCT, PLT in the last 72 hours. No results for input(s): NA, K, CL, CO2, BUN, CREATININE, GLUCOSE, CALCIUM in the last 72 hours. No results for input(s): LABPT, INR in the last 72 hours.  Physical Exam: Neurologically intact ABD soft Sensation intact distally Dorsiflexion/Plantar flexion intact Incision: moderate drainage Compartment soft Body mass index is 29.57 kg/m.   Assessment/Plan: 1 Day Post-Op Procedure(s) (LRB): Posterior spinal fusion interbody L2-4 (N/A) Advance diet Up with therapy  Hopeful to DC this Saturday  [place], Baxter Kailarmen Christina for Dr. Venita Lickahari Brooks [facility] [phone] 07/15/2018, 7:22 AM

## 2018-07-15 NOTE — Progress Notes (Signed)
Occupational Therapy Treatment Patient Details Name: Jesse [name]. MRN: [id number] DOB: 1941-04-17 Today's Date: 07/15/2018    History of present illness Jesse [name] is a 77 y.o. M s/p two step posterior and lateral L2-L4 fusion. PMH includes HTN, Shoulder Surgery, Migraines.   OT comments  Pt making good progress toward OT goals. Session focused on functional mobility, pain management strategies, and d/c planning. Discussion with pt and family re problem solving through home barriers and demo provided re shower transfer. Pt completed 100 ft of functional mobility with RW and (S). D/c plan remains appropriate.    Follow Up Recommendations  No OT follow up;Supervision/Assistance - 24 hour    Equipment Recommendations  3 in 1 bedside commode    Recommendations for Other Services PT consult    Precautions / Restrictions Precautions Precautions: Back Precaution Booklet Issued: Yes (comment) Precaution Comments: Reviewed precautions and pt was cued for maintenance of precautions during functional mobility.  Required Braces or Orthoses: Spinal Brace Spinal Brace: Lumbar corset;Applied in sitting position Restrictions Weight Bearing Restrictions: No       Mobility Bed Mobility Overal bed mobility: Needs Assistance Bed Mobility: Supine to Sit;Sit to Supine     Supine to sit: Supervision Sit to supine: Supervision   General bed mobility comments: HOB flat and rails lowered to simulate home environment. Pt required increased time for pain management and vc for adhering to back precautions  Transfers Overall transfer level: Needs assistance Equipment used: Rolling walker (2 wheeled) Transfers: Sit to/from Stand Sit to Stand: Supervision         General transfer comment: Increased time and effort required to power up to full stand. No assist required however. VC's for upright posture and hand placement on seated surface for safety.     Balance Overall balance  assessment: Needs assistance Sitting-balance support: No upper extremity supported;Feet supported Sitting balance-Leahy Scale: Good Sitting balance - Comments: Pt donned lumbar corset sitting EOB   Standing balance support: No upper extremity supported Standing balance-Leahy Scale: Fair                             ADL either performed or assessed with clinical judgement   ADL                                       Functional mobility during ADLs: Supervision/safety General ADL Comments: Provided education on log rolling technique,  back precautions, and Rw management     Vision       Perception     Praxis      Cognition Arousal/Alertness: Awake/alert Behavior During Therapy: WFL for tasks assessed/performed Overall Cognitive Status: Within Functional Limits for tasks assessed                                          Exercises     Shoulder Instructions       General Comments      Pertinent Vitals/ Pain       Pain Assessment: 0-10 Pain Score: 7  Pain Location: Back Pain Descriptors / Indicators: Grimacing;Guarding Pain Intervention(s): Monitored during session  Home Living  Prior Functioning/Environment              Frequency  Min 2X/week        Progress Toward Goals  OT Goals(current goals can now be found in the care plan section)  Progress towards OT goals: Progressing toward goals  Acute Rehab OT Goals Patient Stated Goal: to return home  Plan Discharge plan remains appropriate    Co-evaluation                 AM-PAC PT "6 Clicks" Daily Activity     Outcome Measure   Help from another person eating meals?: None Help from another person taking care of personal grooming?: A Little Help from another person toileting, which includes using toliet, bedpan, or urinal?: A Little Help from another person bathing (including washing,  rinsing, drying)?: A Little Help from another person to put on and taking off regular upper body clothing?: A Little Help from another person to put on and taking off regular lower body clothing?: A Little 6 Click Score: 19    End of Session Equipment Utilized During Treatment: Gait belt;Rolling walker  OT Visit Diagnosis: Unsteadiness on feet (R26.81);Other abnormalities of gait and mobility (R26.89);Muscle weakness (generalized) (M62.81);Pain   Activity Tolerance Patient tolerated treatment well   Patient Left in bed;with call bell/phone within reach;with family/visitor present   Nurse Communication Mobility status;Precautions        Time: [id number] OT Time Calculation (min): 28 min  Charges: OT General Charges $OT Visit: 1 Visit OT Treatments $Therapeutic Activity: 23-37 mins   Crissie ReeseSandra H Eman Morimoto OTR/L 07/15/2018, 3:55 PM

## 2018-07-15 NOTE — Plan of Care (Signed)
  Problem: Safety: Goal: Ability to remain free from injury will improve Outcome: Progressing   

## 2018-07-15 NOTE — Progress Notes (Signed)
Physical Therapy Treatment Patient Details Name: Jesse HeadingHubbard D Patriarca Jr. MRN: [id number] DOB: [birth year] Today's Date: 07/15/2018    History of Present Illness Jesse [name] is a 77 y.o. M s/p two step posterior and lateral L2-L4 fusion. PMH includes HTN, Shoulder Surgery, Migraines.    PT Comments    Pt progressing towards physical therapy goals. Mobility overall improved however still appeared very guarded and unsteady throughout session. Would benefit from another PT session prior to d/c which is planned for tomorrow per pt report.    Follow Up Recommendations  No PT follow up;Supervision for mobility/OOB     Equipment Recommendations  Rolling walker with 5" wheels    Recommendations for Other Services       Precautions / Restrictions Precautions Precautions: Back Precaution Booklet Issued: Yes (comment) Precaution Comments: Reviewed precautions and pt was cued for maintenance of precautions during functional mobility.  Required Braces or Orthoses: Spinal Brace Spinal Brace: Lumbar corset;Applied in sitting position Restrictions Weight Bearing Restrictions: No    Mobility  Bed Mobility Overal bed mobility: Needs Assistance Bed Mobility: Supine to Sit;Sit to Supine     Supine to sit: Supervision Sit to supine: Supervision   General bed mobility comments: HOB flat and rails lowered to simulate home environment. Pt was able to transition to EOB without assistance.   Transfers Overall transfer level: Needs assistance Equipment used: Rolling walker (2 wheeled) Transfers: Sit to/from Stand Sit to Stand: Min guard         General transfer comment: Increased time and effort required to power up to full stand. No assist required however. VC's for upright posture and hand placement on seated surface for safety.   Ambulation/Gait Ambulation/Gait assistance: Supervision Gait Distance (Feet): 300 Feet Assistive device: Rolling walker (2 wheeled) Gait Pattern/deviations:  Step-through pattern;Decreased stride length;Shuffle;Narrow base of support Gait velocity: Decreased Gait velocity interpretation: <1.31 ft/sec, indicative of household ambulator General Gait Details: VC's for improved posture throughout gait training. Pt appeared very guarded however improved to supervision level by end of session.   Stairs Stairs: Yes Stairs assistance: Min guard Stair Management: One rail Left;Step to pattern;Forwards Number of Stairs: 10 General stair comments: VC's for sequencing and general safety with stair negotiation. Increased time required however pt completed without assistance.   Wheelchair Mobility    Modified Rankin (Stroke Patients Only)       Balance Overall balance assessment: Needs assistance Sitting-balance support: No upper extremity supported;Feet supported Sitting balance-Leahy Scale: Fair Sitting balance - Comments: Pt was able to sit at EOB and apply brace while sitting   Standing balance support: No upper extremity supported Standing balance-Leahy Scale: Fair Standing balance comment: Pt was able to perform static standing without UE support while washing hands, but had some unsteadiness during toileting with AP sway.                             Cognition Arousal/Alertness: Awake/alert Behavior During Therapy: WFL for tasks assessed/performed Overall Cognitive Status: Within Functional Limits for tasks assessed                                        Exercises      General Comments        Pertinent Vitals/Pain Pain Assessment: Faces Faces Pain Scale: Hurts even more Pain Location: Back Pain Descriptors / Indicators: Grimacing;Guarding Pain Intervention(s):  Monitored during session    Home Living                      Prior Function            PT Goals (current goals can now be found in the care plan section) Acute Rehab PT Goals Patient Stated Goal: To use his chainsaw again PT  Goal Formulation: With patient Time For Goal Achievement: 07/28/18 Potential to Achieve Goals: Good Progress towards PT goals: Progressing toward goals    Frequency    Min 5X/week      PT Plan Current plan remains appropriate    Co-evaluation              AM-PAC PT "6 Clicks" Daily Activity  Outcome Measure  Difficulty turning over in bed (including adjusting bedclothes, sheets and blankets)?: A Lot Difficulty moving from lying on back to sitting on the side of the bed? : A Lot Difficulty sitting down on and standing up from a chair with arms (e.g., wheelchair, bedside commode, etc,.)?: A Little Help needed moving to and from a bed to chair (including a wheelchair)?: A Little Help needed walking in hospital room?: A Little Help needed climbing 3-5 steps with a railing? : A Little 6 Click Score: 16    End of Session Equipment Utilized During Treatment: Gait belt;Back brace Activity Tolerance: Patient limited by pain;Patient limited by fatigue Patient left: in chair;with call bell/phone within reach Nurse Communication: Mobility status PT Visit Diagnosis: Unsteadiness on feet (R26.81);Other abnormalities of gait and mobility (R26.89);Muscle weakness (generalized) (M62.81);Difficulty in walking, not elsewhere classified (R26.2);Pain Pain - part of body: (Back)     Time: 7846-9629 PT Time Calculation (min) (ACUTE ONLY): 29 min  Charges:  $Gait Training: 23-37 mins                     Jesse [name], PT, DPT Acute Rehabilitation Services Pager: [phone]    Jesse [name] 07/15/2018, 12:15 PM

## 2018-07-16 NOTE — Progress Notes (Signed)
Occupational Therapy Treatment Patient Details Name: Jesse [name]. MRN: [id number] DOB: 01/24/1941 Today's Date: 07/16/2018    History of present illness [name] is a 77 y.o. M s/p two step posterior and lateral L2-L4 fusion. PMH includes HTN, Shoulder Surgery, Migraines.   OT comments  Pt progressing towards established OT goals. Pt performing LB dressing with AE and Min Guard A for safety in standing. Pt performing functional mobility with supervision and RW. Pt donning shirt and brace demonstrating understanding of brace management and wife assisting with correct positioning. Answering all questions in preparation for dc later today. Continue to recommend dc home once medically stable.    Follow Up Recommendations  No OT follow up;Supervision/Assistance - 24 hour    Equipment Recommendations  3 in 1 bedside commode    Recommendations for Other Services PT consult    Precautions / Restrictions Precautions Precautions: Back Precaution Booklet Issued: Yes (comment) Precaution Comments: Reviewed precautions and pt was cued for maintenance of precautions during functional mobility.  Required Braces or Orthoses: Spinal Brace Spinal Brace: Lumbar corset;Applied in sitting position Restrictions Weight Bearing Restrictions: No       Mobility Bed Mobility Overal bed mobility: Needs Assistance Bed Mobility: Supine to Sit;Sit to Supine     Supine to sit: Supervision Sit to supine: Supervision   General bed mobility comments: OOB upon arrival  Transfers Overall transfer level: Needs assistance Equipment used: Rolling walker (2 wheeled) Transfers: Sit to/from Stand Sit to Stand: Supervision         General transfer comment: VCs for hand placement, increased time to perform    Balance Overall balance assessment: Needs assistance Sitting-balance support: No upper extremity supported;Feet supported Sitting balance-Leahy Scale: Good     Standing balance  support: No upper extremity supported Standing balance-Leahy Scale: Fair                             ADL either performed or assessed with clinical judgement   ADL Overall ADL's : Needs assistance/impaired     Grooming: Min guard;Standing;Wash/dry hands Grooming Details (indicate cue type and reason): MIn GUard A for safety         Upper Body Dressing : Set up;Supervision/safety;Sitting Upper Body Dressing Details (indicate cue type and reason): Pt donning brace and shirt with assistance fro mwife for positioning of brace Lower Body Dressing: Sit to/from stand;With adaptive equipment;Cueing for sequencing;Min guard Lower Body Dressing Details (indicate cue type and reason): Min Guard A for LB ADLs. Reviewing compensatory techniques and use of AE for donning/doffing pants, underwear, and socks.     Toileting- Clothing Manipulation and Hygiene: Minimal assistance;Sit to/from stand Toileting - Clothing Manipulation Details (indicate cue type and reason): Min A to collect toileting paper due to limited ROM and back precautions     Functional mobility during ADLs: Supervision/safety;Rolling walker General ADL Comments: Reviewed education on AE for LB ADLs. pt verbalized and demonstrating understanding.     Vision       Perception     Praxis      Cognition Arousal/Alertness: Awake/alert Behavior During Therapy: WFL for tasks assessed/performed Overall Cognitive Status: Within Functional Limits for tasks assessed                                 General Comments: Pt a little lethargic (suspected due to pain medication). Pt rpeorting "I feel slower."  Pt requiring increased time and cues during session        Exercises     Shoulder Instructions       General Comments Wife and daughter present during session    Pertinent Vitals/ Pain       Pain Assessment: 0-10 Faces Pain Scale: Hurts little more Pain Location: Back Pain Descriptors /  Indicators: Grimacing;Guarding Pain Intervention(s): Monitored during session;Limited activity within patient's tolerance;Premedicated before session;Repositioned  Home Living                                          Prior Functioning/Environment              Frequency  Min 2X/week        Progress Toward Goals  OT Goals(current goals can now be found in the care plan section)  Progress towards OT goals: Progressing toward goals  Acute Rehab OT Goals Patient Stated Goal: To use his chainsaw again OT Goal Formulation: With patient Time For Goal Achievement: 07/28/18 Potential to Achieve Goals: Good ADL Goals Pt Will Perform Lower Body Dressing: with modified independence;with caregiver independent in assisting;sit to/from stand;with adaptive equipment Pt Will Transfer to Toilet: with modified independence;ambulating;bedside commode Pt Will Perform Tub/Shower Transfer: Shower transfer;with min guard assist;ambulating;3 in 1;rolling walker Additional ADL Goal #1: Pt will perform log roll at Mod I level in preparation for ADLs  Plan Discharge plan remains appropriate    Co-evaluation                 AM-PAC PT "6 Clicks" Daily Activity     Outcome Measure   Help from another person eating meals?: None Help from another person taking care of personal grooming?: A Little Help from another person toileting, which includes using toliet, bedpan, or urinal?: A Little Help from another person bathing (including washing, rinsing, drying)?: A Little Help from another person to put on and taking off regular upper body clothing?: A Little Help from another person to put on and taking off regular lower body clothing?: A Little 6 Click Score: 19    End of Session Equipment Utilized During Treatment: Gait belt;Rolling walker  OT Visit Diagnosis: Unsteadiness on feet (R26.81);Other abnormalities of gait and mobility (R26.89);Muscle weakness (generalized)  (M62.81);Pain Pain - part of body: (Back)   Activity Tolerance Patient tolerated treatment well   Patient Left in bed;with call bell/phone within reach;with family/visitor present;with nursing/sitter in room(EOB)   Nurse Communication Mobility status;Precautions        Time: [id number] OT Time Calculation (min): 22 min  Charges: OT General Charges $OT Visit: 1 Visit OT Treatments $Self Care/Home Management : 8-22 mins  Spyridon Hornstein MSOT, OTR/L Acute Rehab Pager: [phone] Office: [phone]   Theodoro GristCharis M Daja Shuping 07/16/2018, 10:17 AM

## 2018-07-16 NOTE — Discharge Summary (Signed)
Physician Discharge Summary  Patient ID: Jesse [name] Kassim Jr. MRN: [id number] DOB/AGE: [birth year] 77 y.o.  Admit date: 07/13/2018 Discharge date: 07/16/2018  Admission Diagnoses:Lumbar pain  Discharge Diagnoses:  Active Problems:   S/P lumbar fusion   Discharged Condition:good  Hospital Course:  Jesse [name] List Jr. is a 77 y.o. who was admitted to [facility]. They were brought to the operating room on 07/14/2018 and underwent Procedure(s): Posterior spinal fusion interbody L2-4.  Patient tolerated the procedure well and was later transferred to the recovery room and then to the orthopaedic floor for postoperative care.  They were given PO and IV analgesics for pain control following their surgery.  They were given 24 hours of postoperative antibiotics of  Anti-infectives (From admission, onward)   Start     Dose/Rate Route Frequency Ordered Stop   07/14/18 2000  vancomycin (VANCOCIN) 500 mg in sodium chloride 0.9 % 100 mL IVPB     500 mg 100 mL/hr over 60 Minutes Intravenous  Once 07/14/18 1141 07/15/18 0804   07/14/18 0730  vancomycin (VANCOCIN) 1,500 mg in sodium chloride 0.9 % 500 mL IVPB     1,500 mg 250 mL/hr over 120 Minutes Intravenous To Surgery 07/14/18 0723 07/14/18 0835   07/14/18 0230  vancomycin (VANCOCIN) IVPB 1000 mg/200 mL premix     1,000 mg 200 mL/hr over 60 Minutes Intravenous  Once 07/13/18 1933 07/14/18 0327   07/13/18 1923  ceFAZolin (ANCEF) IVPB 2g/100 mL premix  Status:  Discontinued     2 g 200 mL/hr over 30 Minutes Intravenous 30 min pre-op 07/13/18 1923 07/13/18 1927   07/13/18 1920  ceFAZolin (ANCEF) IVPB 2g/100 mL premix  Status:  Discontinued     2 g 200 mL/hr over 30 Minutes Intravenous 30 min pre-op 07/13/18 1921 07/14/18 1126   07/13/18 1430  vancomycin (VANCOCIN) 1,500 mg in sodium chloride 0.9 % 500 mL IVPB     1,500 mg 250 mL/hr over 120 Minutes Intravenous To Surgery 07/13/18 1427 07/13/18 1430     and started on DVT prophylaxis    PT  and OT were ordered for total joint protocol.  Discharge planning consulted to help with postop disposition and equipment needs.  Patient had a good night on the evening of surgery and started to get up OOB with therapy on day one.   Continued to work with therapy into day two.  Dressing was with normal limits.  The patient had progressed with therapy and meeting their goals. Patient was seen in rounds and was ready to go home.  Consults: n/a  Significant Diagnostic Studies: rputine  Treatments:routine  Discharge Exam: Blood pressure (!) 166/71, pulse 69, temperature 98.1 F (36.7 C), temperature source Oral, resp. rate 20, height 6' (1.829 m), weight 98.9 kg, SpO2 95 %. Well nourished. Alert and oriented x3. RRR, Lungs clear, BS x4. Abdomen soft and non tender. bilateral Calf soft and non tender. lumbar dressing C/[name]/I. No DVT signs. Compartment soft. No signs of infection.  bilateral LE grossly neurovascular intact.  Disposition: Discharge disposition: 01-Home or Self Care       Discharge Instructions    Incentive spirometry RT   Complete by:  As directed    Incentive spirometry RT   Complete by:  As directed      Allergies as of 07/16/2018      Reactions   Penicillins Rash      Medication List    STOP taking these medications   aspirin 81 MG tablet  GLUCOSAMINE-CHONDROITIN PO     TAKE these medications   JUICE PLUS FIBRE PO Juice Plus Berry blend 1 capsule twice daily Juice Plus Veggie blend 1 capsule twice daily Juice Plus Fruit blend 1 capsule twice daily   methocarbamol 500 MG tablet Commonly known as:  ROBAXIN Take 1 tablet (500 mg total) by mouth 3 (three) times daily.   OMEGA-3 FATTY ACIDS PO Take 1 capsule by mouth 2 (two) times daily.   omeprazole 20 MG capsule Commonly known as:  PRILOSEC Take 20 mg by mouth daily before breakfast.   ondansetron 4 MG disintegrating tablet Commonly known as:  ZOFRAN-ODT Take 1 tablet (4 mg total) by mouth every 8  (eight) hours as needed for nausea or vomiting.   oxyCODONE-acetaminophen 10-325 MG tablet Commonly known as:  PERCOCET Take 1 tablet by mouth every 4 (four) hours as needed for up to 5 days for pain.   propranolol 20 MG tablet Commonly known as:  INDERAL TAKE 1 TABLET BY MOUTH TWICE A DAY   tamsulosin 0.4 MG Caps capsule Commonly known as:  FLOMAX Take 0.4 mg by mouth at bedtime.   topiramate 100 MG tablet Commonly known as:  TOPAMAX Take 1 tablet (100 mg total) by mouth at bedtime.      Follow-up Information    Venita Lick, MD Follow up in 2 week(s).   Specialty:  Orthopedic Surgery Contact information: [address] [place] [address] [phone]           Signed: Markham Jordan 07/16/2018, 9:00 AM

## 2018-07-16 NOTE — Progress Notes (Signed)
Patient is discharged from room 3C09 at this time. Alert and in stable condition. IV site d/c'd and instructions read to patient and family with understanding verbalized. Left unit via wheelchair with all belongings at side. 

## 2018-07-16 NOTE — Progress Notes (Signed)
Physical Therapy Treatment Patient Details Name: Jesse [name]. MRN: [id number] DOB: November 24, 1941 Today's Date: 07/16/2018    History of Present Illness Jesse [name] is a 77 y.o. M s/p two step posterior and lateral L2-L4 fusion. PMH includes HTN, Shoulder Surgery, Migraines.    PT Comments    Patient seen for mobility progression s/p spinal surgery. Mobilizing well but remains slow with cadence. Re-educated patient on precautions, mobility expectations, safety and car transfers. Anticipate patient will be safe for d/c home.   Follow Up Recommendations  No PT follow up;Supervision for mobility/OOB     Equipment Recommendations  Rolling walker with 5" wheels    Recommendations for Other Services       Precautions / Restrictions Precautions Precautions: Back Precaution Booklet Issued: Yes (comment) Precaution Comments: Reviewed precautions and pt was cued for maintenance of precautions during functional mobility.  Required Braces or Orthoses: Spinal Brace Spinal Brace: Lumbar corset;Applied in sitting position Restrictions Weight Bearing Restrictions: No    Mobility  Bed Mobility Overal bed mobility: Needs Assistance Bed Mobility: Supine to Sit;Sit to Supine     Supine to sit: Supervision Sit to supine: Supervision   General bed mobility comments: HOB flat and rails lowered to simulate home environment. Pt was able to transition to EOB without assistance.   Transfers Overall transfer level: Needs assistance Equipment used: Rolling walker (2 wheeled) Transfers: Sit to/from Stand Sit to Stand: Supervision         General transfer comment: VCs for hand placement, increased time to perform  Ambulation/Gait Ambulation/Gait assistance: Supervision Gait Distance (Feet): 310 Feet Assistive device: Rolling walker (2 wheeled) Gait Pattern/deviations: Step-through pattern;Decreased stride length;Shuffle;Narrow base of support Gait velocity: Decreased   General Gait  Details: VCs for increased cadence and upright posture   Stairs             Wheelchair Mobility    Modified Rankin (Stroke Patients Only)       Balance Overall balance assessment: Needs assistance Sitting-balance support: No upper extremity supported;Feet supported Sitting balance-Leahy Scale: Good     Standing balance support: No upper extremity supported Standing balance-Leahy Scale: Fair                              Cognition Arousal/Alertness: Awake/alert Behavior During Therapy: WFL for tasks assessed/performed Overall Cognitive Status: Within Functional Limits for tasks assessed                                        Exercises      General Comments General comments (skin integrity, edema, etc.): educated on car transfers and mobility expectations      Pertinent Vitals/Pain Pain Assessment: 0-10 Faces Pain Scale: Hurts little more Pain Location: Back Pain Descriptors / Indicators: Grimacing;Guarding Pain Intervention(s): Monitored during session    Home Living                      Prior Function            PT Goals (current goals can now be found in the care plan section) Acute Rehab PT Goals Patient Stated Goal: To use his chainsaw again PT Goal Formulation: With patient Time For Goal Achievement: 07/28/18 Potential to Achieve Goals: Good Progress towards PT goals: Progressing toward goals    Frequency    Min 5X/week  PT Plan Current plan remains appropriate    Co-evaluation              AM-PAC PT "6 Clicks" Daily Activity  Outcome Measure  Difficulty turning over in bed (including adjusting bedclothes, sheets and blankets)?: A Lot Difficulty moving from lying on back to sitting on the side of the bed? : A Lot Difficulty sitting down on and standing up from a chair with arms (e.g., wheelchair, bedside commode, etc,.)?: A Little Help needed moving to and from a bed to chair (including  a wheelchair)?: A Little Help needed walking in hospital room?: A Little Help needed climbing 3-5 steps with a railing? : A Little 6 Click Score: 16    End of Session Equipment Utilized During Treatment: Gait belt;Back brace Activity Tolerance: Patient limited by pain;Patient limited by fatigue Patient left: in chair;with call bell/phone within reach Nurse Communication: Mobility status PT Visit Diagnosis: Unsteadiness on feet (R26.81);Other abnormalities of gait and mobility (R26.89);Muscle weakness (generalized) (M62.81);Difficulty in walking, not elsewhere classified (R26.2);Pain Pain - part of body: (Back)     Time: [id number] PT Time Calculation (min) (ACUTE ONLY): 19 min  Charges:  $Gait Training: 8-22 mins                     Jesse [name], PT DPT  Board Certified Neurologic Specialist [phone]    Jesse [name] 07/16/2018, 8:18 AM

## 2018-07-16 NOTE — Progress Notes (Signed)
Subjective: 2 Days Post-Op Procedure(s) (LRB): Posterior spinal fusion interbody L2-4 (N/A) Patient reports pain as low this morning.  Reports it does increase with walking. Progress with PT. Tolerating PO's. Denies numbness or tingling. Denies SOB or CP. He states he is ready for d/c. No bowel or bladder changes.   Objective: Vital signs in last 24 hours: Temp:  [98 F (36.7 C)-100.6 F (38.1 C)] 98.1 F (36.7 C) (08/10 0704) Pulse Rate:  [69-75] 69 (08/10 0704) Resp:  [18-20] 20 (08/10 0704) BP: (146-174)/(67-72) 166/71 (08/10 0704) SpO2:  [94 %-97 %] 95 % (08/10 0704)  Intake/Output from previous day: No intake/output data recorded. Intake/Output this shift: No intake/output data recorded.  No results for input(s): HGB in the last 72 hours. No results for input(s): WBC, RBC, HCT, PLT in the last 72 hours. No results for input(s): NA, K, CL, CO2, BUN, CREATININE, GLUCOSE, CALCIUM in the last 72 hours. No results for input(s): LABPT, INR in the last 72 hours.  Well nourished. Alert and oriented x3. RRR, Lungs clear, BS x4. Abdomen soft and non tender.  Calf soft and non tender. Lumbar dressing C/D/I. No DVT signs. Compartment soft. No signs of infection.  Bilateral LE grossly neurovascular intact.  Anticipated LOS equal to or greater than 2 midnights due to - Age 77 and older with one or more of the following:  - Obesity  - Expected need for hospital services (PT, OT, Nursing) required for safe  discharge  - Anticipated need for postoperative skilled nursing care or inpatient rehab  - Active co-morbidities: None OR   - Unanticipated findings during/Post Surgery: None  - Patient is a high risk of re-admission due to: None   Assessment/Plan: 2 Days Post-Op Procedure(s) (LRB): Posterior spinal fusion interbody L2-4 (N/A) Advance diet Up with therapy Discharge home with home health  Follow up with Dr.Brooks in office Follow instructions Bowel management  Lumbar brace as  directed.    Katryna Tschirhart L 07/16/2018, 9:01 AM

## 2018-08-14 ENCOUNTER — Other Ambulatory Visit: Payer: Self-pay | Admitting: Neurology

## 2018-08-16 ENCOUNTER — Encounter (HOSPITAL_COMMUNITY): Payer: Self-pay | Admitting: Orthopedic Surgery

## 2018-08-23 NOTE — Op Note (Signed)
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

## 2018-11-07 ENCOUNTER — Ambulatory Visit (INDEPENDENT_AMBULATORY_CARE_PROVIDER_SITE_OTHER): Payer: Medicare Other | Admitting: Neurology

## 2018-11-07 ENCOUNTER — Encounter: Payer: Self-pay | Admitting: Neurology

## 2018-11-07 VITALS — BP 140/78 | HR 65 | Ht 72.0 in | Wt 222.0 lb

## 2018-11-07 DIAGNOSIS — G43009 Migraine without aura, not intractable, without status migrainosus: Secondary | ICD-10-CM

## 2018-11-07 MED ORDER — TOPIRAMATE 100 MG PO TABS: ORAL_TABLET | ORAL | 4 refills | Status: DC

## 2018-11-07 MED ORDER — PROPRANOLOL HCL 20 MG PO TABS: 20.0000 mg | ORAL_TABLET | Freq: Two times a day (BID) | ORAL | 3 refills | Status: DC

## 2018-11-07 NOTE — Progress Notes (Signed)
[facility]    Provider:  Dr Jaynee [name] Referring Provider: [name], MD Primary Care Physician:  Jesse Brome, MD  CC: Headaches   Interval history 11/07/2018: Chronic daily headaches for 40 years, a last appointment was improved to 15 mild a month since addressing his medication overuse and starting Topamax and propranolol. 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 Maybe having 1-2 headache days a month now, extremely well, may have been all the medication overuse. Will dc Topiramate and monitor clinically.  Medications tried: Topiramate, Candesartan, propranolol, diltiazem, robaxin, zofran, imitrex, lisinopril  Interval history 11/02/2017:  Half the month of headaches 1-2. Still on the Topamax and Candesartan. 2-3 days a month are migrainous. He tried the imitrex. Candesartan expensive, discussed other medications such as propranolol and Ajovy.   Medications tried: Topiramate, Candesartan, propranolol, diltiazem, robaxin, zofran, imitrex, lisinopril  Interval history 08/02/2017: he has been better recently. Was having daily headaches, now having 15 headache days a month and 10 are migrainous. .Pain is just on the right side on the forehead and around the eye but it can spread. Throbbing, pounding, more pressure. It is "just there". Pressing on it makes it better, sitting in a chair and not moving helps, can hurt and has light sensitivity, no nausea. They can up to 24 hours a day. They can be a 7-8/10 in pain. Right now his headache is at a 5/10.   Medications tried: Topiramate, Candesartan, prednisone, Imitrex, diltiazem, lisinopril, tumeric  Interval history 01/19/2017:  Patient's headaches for 40+ years. Daily headache. Here with his wife. Stopping medication overuse did not help. He has not taken any daily ibuprofen or tylenol and still continues to have daily severe headaches. Discussed management of migraines and initiation of a  daily preventative, choices. Also check esr and crp today given his right eye visual changes but denies any other symptoms of temporal arteritis and given the long-standing nature my suspicion is low.   Jesse Daughertyis a 77 y.o.malehere as a referral from Jesse [name] headaches. PMHx hld, htn, ED, pre-diabetes, fatty liver, He has had headaches for 40+ years. He denies that they have recently changed, here with his wife who also provides much information.He has lived with headaches all his life.No vision changes, jaw claudication, neck stiffness or other associated symptoms.They thought it was allergies and sinuses in the past and he has been treated for this in the past. The last time he had a headache there was no associated sinus issue. Wife is here and provides information.Pain is just on the right side on the forehead and around the eye but it can spread. Not throbbing, not pounding, more pressure. It is "just there". Pressing on it makes it better, sitting in a chair and not moving helps, can hurt but no light sensitivity and can look at his phone. Worse after mowing the lawn. No light, sound, smell sensitivity. No nausea or vomiting. Headaches are continuous all day long for years continuous. Pain can be very dull or it can be severe and "just hurts". He takes tylenol and ibuprofen every day. Sometimes he takes OTC meds the morning and at dinner, multiple times a day. Most days he will take it every night and some mornings. He did not start topamax and not the steroids he was given. No inciting events, no other associated symptoms. He has been taking daily OTC medications for years. Continuous headaches correspond to daily OTC med use.   Reviewed notes,  labs and imaging from outside physicians, which showed:  BUN 11, Creatinine 0.820 09/10/2016  Reviewed notes from pcp, patients reported headache started 3 months ago, bilateral frontal and behind the eyes, no radiation, severe,  throbbing, dull and "sinus pressure" It improved with sleep, taking antibiotics since September along with ibuprofen and tylenol daily which help. Toradol has been administered. Topamax and prednisone were prescribed for him. Sed rate was ordered.   Review of Systems: Patient complains of symptoms per HPI as well as the following symptoms: No CP, no SOB. Pertinent negatives per HPI. All others negative.  Social History   Socioeconomic History  . Marital status: Married    Spouse name: Jesse [name]  . Number of children: 4  . Years of education: 12+  . Highest education level: Not on file  Occupational History  . Occupation: Retired  Scientific laboratory technician  . Financial resource strain: Not on file  . Food insecurity:    Worry: Not on file    Inability: Not on file  . Transportation needs:    Medical: Not on file    Non-medical: Not on file  Tobacco Use  . Smoking status: Former Smoker    Types: Cigarettes  . Smokeless tobacco: Never Used  . Tobacco comment: 12.2.19-quit at least 40 years ago   Substance and Sexual Activity  . Alcohol use: No  . Drug use: No  . Sexual activity: Not on file  Lifestyle  . Physical activity:    Days per week: Not on file    Minutes per session: Not on file  . Stress: Not on file  Relationships  . Social connections:    Talks on phone: Not on file    Gets together: Not on file    Attends religious service: Not on file    Active member of club or organization: Not on file    Attends meetings of clubs or organizations: Not on file    Relationship status: Not on file  . Intimate partner violence:    Fear of current or ex partner: Not on file    Emotionally abused: Not on file    Physically abused: Not on file    Forced sexual activity: Not on file  Other Topics Concern  . Not on file  Social History Narrative   Lives with wife   Caffeine use: Drinks decaf coffee and tea   Writes right-handed, eats and shaves left-handed    Family History  Problem  Relation Age of Onset  . Hypertension Mother   . Cancer Father   . Diabetes Brother   . Migraines Neg Hx     Past Medical History:  Diagnosis Date  . Arthritis   . Degenerative lumbar spinal stenosis   . Fatty liver   . GERD (gastroesophageal reflux disease)   . Headache   . History of kidney stones   . Hyperlipidemia   . Hypertension   . Migraine without aura and without status migrainosus, not intractable   . Neuritis or radiculitis due to rupture of lumbar intervertebral disc 07/15/2018  . Radiculopathy of thoracolumbar region 07/15/2018    Past Surgical History:  Procedure Laterality Date  . ANTERIOR LATERAL LUMBAR FUSION WITH PERCUTANEOUS SCREW 2 LEVEL N/A 07/13/2018   Procedure: ANTERIOR LATERAL LUMBAR FUSION L2-4;  Surgeon: Melina Schools, MD;  Location: [facility];  Service: Orthopedics;  Laterality: N/A;  4 hrs  . CHOLECYSTECTOMY  1999  . GALLBLADDER SURGERY  2016  . [place]  Ruptured disc repair  . SHOULDER SURGERY Left 2005  . SHOULDER SURGERY Right 2016  . THROAT SURGERY     stretching    Current Outpatient Medications  Medication Sig Dispense Refill  . Nutritional Supplements (JUICE PLUS FIBRE PO) Juice Plus Berry blend 1 capsule twice daily Juice Plus Veggie blend 1 capsule twice daily Juice Plus Fruit blend 1 capsule twice daily    . OMEGA-3 FATTY ACIDS PO Take 1 capsule by mouth 2 (two) times daily.     Marland Kitchen omeprazole (PRILOSEC) 20 MG capsule Take 20 mg by mouth daily before breakfast.     . propranolol (INDERAL) 20 MG tablet TAKE 1 TABLET BY MOUTH TWICE A DAY 60 tablet 3  . tamsulosin (FLOMAX) 0.4 MG CAPS capsule Take 0.4 mg by mouth at bedtime.     . topiramate (TOPAMAX) 100 MG tablet Take 1 tablet (100 mg total) by mouth at bedtime. 90 tablet 4   No current facility-administered medications for this visit.     Allergies as of 11/07/2018 - Review Complete 11/07/2018  Allergen Reaction Noted  . Penicillins Rash 07/06/2018    Vitals: BP  140/78 (BP Location: Right Arm, Patient Position: Sitting)   Pulse 65   Ht 6' (1.829 m)   Wt 222 lb (100.7 kg)   BMI 30.11 kg/m  Last Weight:  Wt Readings from Last 1 Encounters:  11/07/18 222 lb (100.7 kg)   Last Height:   Ht Readings from Last 1 Encounters:  11/07/18 6' (1.829 m)    Physical exam: Exam: Gen: NAD, conversant, well nourised, obese, well groomed                     CV: RRR, no MRG. No Carotid Bruits. No peripheral edema, warm, nontender Eyes: Conjunctivae clear without exudates or hemorrhage  Neuro: Detailed Neurologic Exam  Speech:    Speech is normal; fluent and spontaneous with normal comprehension.  Cognition:    The patient is oriented to person, place, and time;     recent and remote memory intact;     language fluent;     normal attention, concentration,     fund of knowledge Cranial Nerves:    The pupils are round, and reactive to light, anisocoria right 64m > left but equally reactive. Left miosis.  The fundi are normal and spontaneous venous pulsations are present. Visual fields are full to finger confrontation. Extraocular movements are intact. Trigeminal sensation is intact and the muscles of mastication are normal. The face is symmetric. The palate elevates in the midline. Hearing intact. Voice is normal. Shoulder shrug is normal. The tongue has normal motion without fasciculations.   Coordination:    Normal finger to nose and heel to shin. Normal rapid alternating movements.   Gait:    Heel-toe and tandem gait are normal.   Motor Observation:    No asymmetry, no atrophy, and no involuntary movements noted. Tone:    Normal muscle tone.    Posture:    Posture is normal. normal erect    Strength:    Strength is V/V in the upper and lower limbs.      Sensation: intact to LT     Reflex Exam:   Assessment/Plan:Chronic daily headaches for 40 years, now improved to 15 mild a month since addressing his medication overuse and starting  Topamax and propranolol. 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 Doing extremey well, from daily headaches to 1-2  mild headache days a month.  - Cut the Topiramate in 1/2 to 61m. For about 6 weeks then stop it. If headaches return then go back to last dose. -- [phone] Jesse. PHardin Negusis ophthalmologist As far as your medications are concerned, I would like to suggest:  - Topamax(Topiramate): as above - Continue propranolol - declined MRI, had one in 2017 which was normal  At onset of headache, take Sumatriptan. Please take one tablet at the onset of your headache. If it does not improve the symptoms please take one additional tablet in 2 hours. Do not take more then 2 tablets in 24hrs. Do not take use more then 2 to 3 times in a week.  I would like to see you back in 1 year, sooner if we need to. Please call [place] any interim questions, concerns, problems, updates or refill requests.   (Addendum report received from [facility]: report states no acute intracranial abnormality. No other details on the brain parenchyma unfortunately specifically no mention of brain volume, white matter changes etc. Labs 09/20/15 CBC unremarkable, cmp also unremarkable bun 13, creat .7,hgba1c 5.7)  Discussed: To prevent or relieve headaches, try the following: Cool Compress. Lie down and place a cool compress on your head.  Avoid headache triggers. If certain foods or odors seem to have triggered your migraines in the past, avoid them. A headache diary might help you identify triggers.  Include physical activity in your daily routine. Try a daily walk or other moderate aerobic exercise.  Manage stress. Find healthy ways to cope with the stressors, such as delegating tasks on your to-do list.  Practice relaxation techniques. Try deep breathing, yoga, massage and visualization.  Eat regularly. Eating regularly scheduled meals and maintaining a healthy  diet might help prevent headaches. Also, drink plenty of fluids.  Follow a regular sleep schedule. Sleep deprivation might contribute to headaches Consider biofeedback. With this mind-body technique, you learn to control certain bodily functions - such as muscle tension, heart rate and blood pressure - to prevent headaches or reduce headache pain.    Proceed to emergency room if you experience new or worsening symptoms or symptoms do not resolve, if you have new neurologic symptoms or if headache is severe, or for any concerning symptom.   Provided education and documentation from American headache Society toolbox including articles on: chronic migraine medication overuse headache, chronic migraines, prevention of migraines, behavioral and other nonpharmacologic treatments for headache.  [facility] [address] [place] [address] Phone [phone] [phone] A total of 15 minutes was spent face-to-face with this patient. Over half this time was spent on counseling patient on the  1. Migraine without aura and without status migrainosus, not intractable    diagnosis and different diagnostic and therapeutic options available.

## 2018-11-07 NOTE — Patient Instructions (Addendum)
Cut the Topiramate in 1/2 to 50mg . For about 6 weeks then stop it. If headaches return then go back to last dose.

## 2018-12-19 ENCOUNTER — Telehealth: Payer: Self-pay | Admitting: Oncology

## 2018-12-19 NOTE — Telephone Encounter (Signed)
Received a new referral from Dr. Allena Katz for dx of elevated AFP. Pt has been cld and scheduled to see Dr. Truett Perna on 1/18 at 1030am, Pt aware to arrive 30 minutes early.

## 2018-12-21 ENCOUNTER — Telehealth: Payer: Self-pay | Admitting: Hematology

## 2018-12-21 NOTE — Telephone Encounter (Signed)
Cld the pt and rescheduled his hem appt Dr. Candise Che on 1/27 at 11am. I offered to send the pt's referral to [facility], but pt declined. Pt aware to arrive 30 minutes early on 1/27

## 2018-12-24 ENCOUNTER — Encounter: Payer: Medicare Other | Admitting: Oncology

## 2018-12-30 NOTE — Progress Notes (Signed)
HEMATOLOGY/ONCOLOGY CONSULTATION NOTE  Date of Service: 01/02/2019  Patient Care Team: [name], MD as PCP - General (Family Medicine)  CHIEF COMPLAINTS/PURPOSE OF CONSULTATION:  Elevated AFP  HISTORY OF PRESENTING ILLNESS:  Jesse Howton. is a wonderful 78 y.o. male who has been referred to [place] by Dr. Dirk Dress at [facility] in [place], [place] for evaluation and management of Elevated AFP. He is accompanied today by his wife and daughter. The pt reports that he is doing well overall.  The pt denies feeling any differently recently now as compared to the previous 6 months to a year ago. He did have back surgery about 6 months ago. The pt notes that he has been following with an endocrinologist Dr. Posey Pronto for evaluation of some tenderness in the breast and increased tissue.  The pt denies unexpected weight loss, fevers, chills, night sweats, or abdominal pains. The pt notes that he is up to speed with all of his age appropriate cancer screening. He denies any cancer that runs in the family. He quit smoking 40 years ago.  The pt reports that his PCP in the distant past told him that his blood "always showed signs of an infection," without one being present. He notes that his recent PCP has noted similar observations, but denies knowledge of further details.  He notes that about 20 years ago he had a liver biopsy which revealed signs of fatty liver, but is unsure of NASH. The pt has not had any further follow up about his liver since that time.  The pt notes that he had a previous history of migraines, which has been improving and is decreasing on Topomax.  Of note prior to the patient's visit today, pt has had a US Scrotum completed on 09/13/18 with results revealing No worrisome testicular lesion identified to explain this patient's history of elevated AFP.  Most recent lab results (09/01/18) of CMP is as follows: all values are WNL except for Glucose at 101. 12/02/18 AFP  Tumor Marker elevated at 15.74 09/01/18 AFP Tumor Marker elevated at 12.87  On review of systems, pt reports good energy levels, increased breast tissue, eating well, weight gain, and denies fevers, chills, night sweats, abdominal pains, unexpected weight loss, pain along the spine, leg swelling, calf pain, testicular pain or swelling, and any other symptoms.   On PMHx the pt reports anterior lateral lumbar fusion in August 2019. On Social Hx the pt denies alcohol consumption, denies any concern for excessive alcohol use at any point. Pt quit smoking cigarettes 40 years ago. Worked as a Psychologist, sport and exercise. On Family Hx the pt reports father who smoked and developed throat cancer. Denies other cancers that run in the family.  MEDICAL HISTORY:  Past Medical History:  Diagnosis Date  . Arthritis   . Degenerative lumbar spinal stenosis   . Fatty liver   . GERD (gastroesophageal reflux disease)   . Headache   . History of kidney stones   . Hyperlipidemia   . Hypertension   . Migraine without aura and without status migrainosus, not intractable   . Neuritis or radiculitis due to rupture of lumbar intervertebral disc 07/15/2018  . Radiculopathy of thoracolumbar region 07/15/2018    SURGICAL HISTORY: Past Surgical History:  Procedure Laterality Date  . ANTERIOR LATERAL LUMBAR FUSION WITH PERCUTANEOUS SCREW 2 LEVEL N/A 07/13/2018   Procedure: ANTERIOR LATERAL LUMBAR FUSION L2-4;  Surgeon: Melina Schools, MD;  Location: [facility];  Service: Orthopedics;  Laterality: N/A;  4 hrs  . CHOLECYSTECTOMY  1999  . GALLBLADDER SURGERY  2016  . NECK SURGERY  1995   Ruptured disc repair  . SHOULDER SURGERY Left 2005  . SHOULDER SURGERY Right 2016  . THROAT SURGERY     stretching    SOCIAL HISTORY: Social History   Socioeconomic History  . Marital status: Married    Spouse name: Jesse [name]  . Number of children: 4  . Years of education: 12+  . Highest education level: Not on file  Occupational History  .  Occupation: Retired  Scientific laboratory technician  . Financial resource strain: Not on file  . Food insecurity:    Worry: Not on file    Inability: Not on file  . Transportation needs:    Medical: Not on file    Non-medical: Not on file  Tobacco Use  . Smoking status: Former Smoker    Types: Cigarettes  . Smokeless tobacco: Never Used  . Tobacco comment: 12.2.19-quit at least 40 years ago   Substance and Sexual Activity  . Alcohol use: No  . Drug use: No  . Sexual activity: Not on file  Lifestyle  . Physical activity:    Days per week: Not on file    Minutes per session: Not on file  . Stress: Not on file  Relationships  . Social connections:    Talks on phone: Not on file    Gets together: Not on file    Attends religious service: Not on file    Active member of club or organization: Not on file    Attends meetings of clubs or organizations: Not on file    Relationship status: Not on file  . Intimate partner violence:    Fear of current or ex partner: Not on file    Emotionally abused: Not on file    Physically abused: Not on file    Forced sexual activity: Not on file  Other Topics Concern  . Not on file  Social History Narrative   Lives with wife   Caffeine use: Drinks decaf coffee and tea   Writes right-handed, eats and shaves left-handed    FAMILY HISTORY: Family History  Problem Relation Age of Onset  . Hypertension Mother   . Cancer Father   . Diabetes Brother   . Migraines Neg Hx     ALLERGIES:  is allergic to penicillins.  MEDICATIONS:  Current Outpatient Medications  Medication Sig Dispense Refill  . Nutritional Supplements (JUICE PLUS FIBRE PO) Juice Plus Berry blend 1 capsule twice daily Juice Plus Veggie blend 1 capsule twice daily Juice Plus Fruit blend 1 capsule twice daily    . OMEGA-3 FATTY ACIDS PO Take 1 capsule by mouth 2 (two) times daily.     Marland Kitchen omeprazole (PRILOSEC) 20 MG capsule Take 20 mg by mouth daily before breakfast.     . propranolol  (INDERAL) 20 MG tablet Take 1 tablet (20 mg total) by mouth 2 (two) times daily. 180 tablet 3  . tamsulosin (FLOMAX) 0.4 MG CAPS capsule Take 0.4 mg by mouth at bedtime.     . topiramate (TOPAMAX) 100 MG tablet Decrease to 50 mg for 6 weeks then stop. If headaches worsen then go back to last dose. 90 tablet 4   No current facility-administered medications for this visit.     REVIEW OF SYSTEMS:    10 Point review of Systems was done is negative except as noted above.  PHYSICAL EXAMINATION:  . Vitals:   01/02/19  1059  BP: (!) 153/70  Pulse: (!) 55  Resp: 18  Temp: 97.6 F (36.4 C)  SpO2: 99%   Filed Weights   01/02/19 1059  Weight: 223 lb 9.6 oz (101.4 kg)   .Body mass index is 30.33 kg/m.  GENERAL:alert, in no acute distress and comfortable SKIN: no acute rashes, no significant lesions EYES: conjunctiva are pink and non-injected, sclera anicteric OROPHARYNX: MMM, no exudates, no oropharyngeal erythema or ulceration NECK: supple, no JVD LYMPH:  no palpable lymphadenopathy in the cervical, axillary or inguinal regions LUNGS: clear to auscultation b/l with normal respiratory effort HEART: regular rate & rhythm ABDOMEN:  normoactive bowel sounds , non tender, not distended. No palpable hepatosplenomegaly  Extremity: no pedal edema PSYCH: alert & oriented x 3 with fluent speech NEURO: no focal motor/sensory deficits  LABORATORY DATA:  I have reviewed the data as listed  . CBC Latest Ref Rng & Units 07/06/2018 08/02/2017  WBC 4.0 - 10.5 K/uL 5.4 5.0  Hemoglobin 13.0 - 17.0 g/dL 14.9 14.6  Hematocrit 39.0 - 52.0 % 44.6 44.8  Platelets 150 - 400 K/uL 139(L) 159    . CMP Latest Ref Rng & Units 07/06/2018 08/02/2017  Glucose 70 - 99 mg/dL 109(H) 89  BUN 8 - 23 mg/dL 10 12  Creatinine 0.61 - 1.24 mg/dL 0.91 0.87  Sodium 135 - 145 mmol/L 142 143  Potassium 3.5 - 5.1 mmol/L 3.5 4.2  Chloride 98 - 111 mmol/L 111 105  CO2 22 - 32 mmol/L 22 25  Calcium 8.9 - 10.3 mg/dL 9.4  9.7  Total Protein 6.5 - 8.1 g/dL 6.6 6.9  Total Bilirubin 0.3 - 1.2 mg/dL 0.9 0.4  Alkaline Phos 38 - 126 U/L 81 105  AST 15 - 41 U/L 29 26  ALT 0 - 44 U/L 37 32   09/01/18 AFP and CMP:    RADIOGRAPHIC STUDIES: I have personally reviewed the radiological images as listed and agreed with the findings in the report.  09/13/18 US Scrotum:   No results found.  ASSESSMENT & PLAN:  78 y.o. male with   1. Elevated AFP PLAN -Discussed patient's most recent labs from 09/01/18, chemistries were normal including Alk Phos at 100, AST at 20, ALT at 23 -Discussed the 12/02/18 AFP at 15.74, 09/01/18 AFP at 12.87 -Discussed the 09/13/18 US Scrotum which revealed No worrisome testicular lesion identified to explain this patient's history of elevated AFP -Discussed that the patient's borderline AFP elevation is non-specific and is often found in patient's with fatty liver or NASH -Would like to rule out signs of cirrhosis, which if present, could explain both the patient's slightly elevated AFP and gynecomastia  -Would also like to rule out extra-testicular germ cell tumors -The pt prefers watchful observation with his PCP at this time -Recommend PCP Dr. Dirk Dress order US Abdomen and rule out chronic liver disease and splenomegaly. If unrevealing, recommend continuing to watch AFP in 2-3 months, and if changing, pursue a whole body CT C/A/P. -Continue follow up with PCP for management of age appropriate cancer screening as well -Will order blood tests today  -Will be happy to see the pt back as needed   Labs today RTC with Dr Irene Limbo as needed based on labs   All of the patients questions were answered with apparent satisfaction. The patient knows to call the clinic with any problems, questions or concerns.  The total time spent in the appt was 45 minutes and more than 50% was on counseling and  direct patient cares.    Sullivan Lone MD MS AAHIVMS [facility] Hematology/Oncology Physician [facility]  (Office):       [phone] (Work cell):  [phone] (Fax):           [phone]  01/02/2019 11:47 AM  I, Baldwin [place], am acting as a scribe for Dr. Sullivan Lone.   .I have reviewed the above documentation for accuracy and completeness, and I agree with the above. Brunetta Genera MD

## 2019-01-02 ENCOUNTER — Telehealth: Payer: Self-pay

## 2019-01-02 ENCOUNTER — Inpatient Hospital Stay: Payer: PPO

## 2019-01-02 ENCOUNTER — Inpatient Hospital Stay: Payer: PPO | Attending: Oncology | Admitting: Hematology

## 2019-01-02 VITALS — BP 153/70 | HR 55 | Temp 97.6°F | Resp 18 | Ht 72.0 in | Wt 223.6 lb

## 2019-01-02 DIAGNOSIS — R772 Abnormality of alphafetoprotein: Secondary | ICD-10-CM | POA: Insufficient documentation

## 2019-01-02 LAB — CMP (CANCER CENTER ONLY)
ALT: 36 U/L (ref 0–44)
AST: 24 U/L (ref 15–41)
Albumin: 4.1 g/dL (ref 3.5–5.0)
Alkaline Phosphatase: 99 U/L (ref 38–126)
Anion gap: 6 (ref 5–15)
BUN: 12 mg/dL (ref 8–23)
CO2: 28 mmol/L (ref 22–32)
Calcium: 9.5 mg/dL (ref 8.9–10.3)
Chloride: 110 mmol/L (ref 98–111)
Creatinine: 0.9 mg/dL (ref 0.61–1.24)
GFR, Est AFR Am: >60 mL/min (ref >60)
GFR, Estimated: >60 mL/min (ref >60)
Glucose, Bld: 97 mg/dL (ref 70–99)
POTASSIUM: 4 mmol/L (ref 3.5–5.1)
Sodium: 144 mmol/L (ref 135–145)
Total Bilirubin: 0.5 mg/dL (ref 0.3–1.2)
Total Protein: 7.1 g/dL (ref 6.5–8.1)

## 2019-01-02 LAB — CBC WITH DIFFERENTIAL/PLATELET
Abs Immature Granulocytes: 0.01 10*3/uL (ref 0.00–0.07)
BASOS ABS: 0 10*3/uL (ref 0.0–0.1)
Basophils Relative: 1 %
Eosinophils Absolute: 0.2 10*3/uL (ref 0.0–0.5)
Eosinophils Relative: 3 %
HCT: 43.2 % (ref 39.0–52.0)
Hemoglobin: 14.7 g/dL (ref 13.0–17.0)
IMMATURE GRANULOCYTES: 0 %
Lymphocytes Relative: 22 %
Lymphs Abs: 1.2 10*3/uL (ref 0.7–4.0)
MCH: 29.2 pg (ref 26.0–34.0)
MCHC: 34 g/dL (ref 30.0–36.0)
MCV: 85.9 fL (ref 80.0–100.0)
Monocytes Absolute: 0.5 10*3/uL (ref 0.1–1.0)
Monocytes Relative: 10 %
NRBC: 0 % (ref 0.0–0.2)
Neutro Abs: 3.4 10*3/uL (ref 1.7–7.7)
Neutrophils Relative %: 64 %
Platelets: 156 10*3/uL (ref 150–400)
RBC: 5.03 MIL/uL (ref 4.22–5.81)
RDW: 13.4 % (ref 11.5–15.5)
WBC: 5.4 10*3/uL (ref 4.0–10.5)

## 2019-01-02 NOTE — Telephone Encounter (Signed)
Printed avs and calender of upcoming appointment. Per 1/27 los 

## 2019-01-03 LAB — AFP TUMOR MARKER: AFP, Serum, Tumor Marker: 11.2 ng/mL — ABNORMAL HIGH (ref 0.0–8.3)

## 2019-01-10 DIAGNOSIS — M48062 Spinal stenosis, lumbar region with neurogenic claudication: Secondary | ICD-10-CM | POA: Diagnosis not present

## 2019-01-10 DIAGNOSIS — M5136 Other intervertebral disc degeneration, lumbar region: Secondary | ICD-10-CM | POA: Diagnosis not present

## 2019-04-13 DIAGNOSIS — Z1159 Encounter for screening for other viral diseases: Secondary | ICD-10-CM | POA: Diagnosis not present

## 2019-04-13 DIAGNOSIS — R946 Abnormal results of thyroid function studies: Secondary | ICD-10-CM | POA: Diagnosis not present

## 2019-04-13 DIAGNOSIS — Z125 Encounter for screening for malignant neoplasm of prostate: Secondary | ICD-10-CM | POA: Diagnosis not present

## 2019-04-13 DIAGNOSIS — R7301 Impaired fasting glucose: Secondary | ICD-10-CM | POA: Diagnosis not present

## 2019-04-13 DIAGNOSIS — E663 Overweight: Secondary | ICD-10-CM | POA: Diagnosis not present

## 2019-04-13 DIAGNOSIS — Z0000 Encounter for general adult medical examination without abnormal findings: Secondary | ICD-10-CM | POA: Diagnosis not present

## 2019-04-13 DIAGNOSIS — E782 Mixed hyperlipidemia: Secondary | ICD-10-CM | POA: Diagnosis not present

## 2019-04-19 DIAGNOSIS — E782 Mixed hyperlipidemia: Secondary | ICD-10-CM | POA: Diagnosis not present

## 2019-04-19 DIAGNOSIS — R7301 Impaired fasting glucose: Secondary | ICD-10-CM | POA: Diagnosis not present

## 2019-04-19 DIAGNOSIS — Z125 Encounter for screening for malignant neoplasm of prostate: Secondary | ICD-10-CM | POA: Diagnosis not present

## 2019-04-19 DIAGNOSIS — R946 Abnormal results of thyroid function studies: Secondary | ICD-10-CM | POA: Diagnosis not present

## 2019-04-19 DIAGNOSIS — I10 Essential (primary) hypertension: Secondary | ICD-10-CM | POA: Diagnosis not present

## 2019-11-21 ENCOUNTER — Encounter: Payer: Self-pay | Admitting: *Deleted

## 2019-11-21 ENCOUNTER — Other Ambulatory Visit: Payer: Self-pay | Admitting: Neurology

## 2019-11-21 NOTE — Progress Notes (Signed)
Refill request for propranolol. Pt can see pcp, per last office note, RTC prn. Temp refill given with note to have pcp fill or schedule appt here.

## 2020-02-05 DIAGNOSIS — R1310 Dysphagia, unspecified: Secondary | ICD-10-CM | POA: Diagnosis not present

## 2020-02-05 DIAGNOSIS — K222 Esophageal obstruction: Secondary | ICD-10-CM | POA: Diagnosis not present

## 2020-02-05 DIAGNOSIS — K219 Gastro-esophageal reflux disease without esophagitis: Secondary | ICD-10-CM | POA: Diagnosis not present

## 2020-02-16 ENCOUNTER — Other Ambulatory Visit: Payer: Self-pay | Admitting: Neurology

## 2020-02-16 DIAGNOSIS — Z20828 Contact with and (suspected) exposure to other viral communicable diseases: Secondary | ICD-10-CM | POA: Diagnosis not present

## 2020-02-16 DIAGNOSIS — Z7189 Other specified counseling: Secondary | ICD-10-CM | POA: Diagnosis not present

## 2020-02-23 DIAGNOSIS — I10 Essential (primary) hypertension: Secondary | ICD-10-CM | POA: Diagnosis not present

## 2020-02-23 DIAGNOSIS — K449 Diaphragmatic hernia without obstruction or gangrene: Secondary | ICD-10-CM | POA: Diagnosis not present

## 2020-02-23 DIAGNOSIS — K219 Gastro-esophageal reflux disease without esophagitis: Secondary | ICD-10-CM | POA: Diagnosis not present

## 2020-02-23 DIAGNOSIS — K22710 Barrett's esophagus with low grade dysplasia: Secondary | ICD-10-CM | POA: Diagnosis not present

## 2020-02-23 DIAGNOSIS — K222 Esophageal obstruction: Secondary | ICD-10-CM | POA: Diagnosis not present

## 2020-02-23 DIAGNOSIS — K229 Disease of esophagus, unspecified: Secondary | ICD-10-CM | POA: Diagnosis not present

## 2020-02-23 DIAGNOSIS — K2270 Barrett's esophagus without dysplasia: Secondary | ICD-10-CM | POA: Diagnosis not present

## 2020-03-06 ENCOUNTER — Encounter: Payer: Self-pay | Admitting: Legal Medicine

## 2020-05-09 ENCOUNTER — Other Ambulatory Visit: Payer: Self-pay

## 2020-05-09 DIAGNOSIS — H906 Mixed conductive and sensorineural hearing loss, bilateral: Secondary | ICD-10-CM

## 2020-05-13 ENCOUNTER — Other Ambulatory Visit: Payer: Self-pay | Admitting: Neurology

## 2020-05-13 ENCOUNTER — Telehealth: Payer: Self-pay | Admitting: *Deleted

## 2020-05-13 IMAGING — RF DG C-ARM 61-120 MIN
1 series · 3 of 3 positions shown · non-contrast
Comparison: None.

CLINICAL DATA: Status post surgical posterior fusion.

EXAM:
LUMBAR SPINE - 2-3 VIEW; DG C-ARM 61-120 MIN
FLUOROSCOPY TIME:  3 minutes 29 seconds.

[Series 1: run · 3 of 3 slices shown]
[im 1/3]
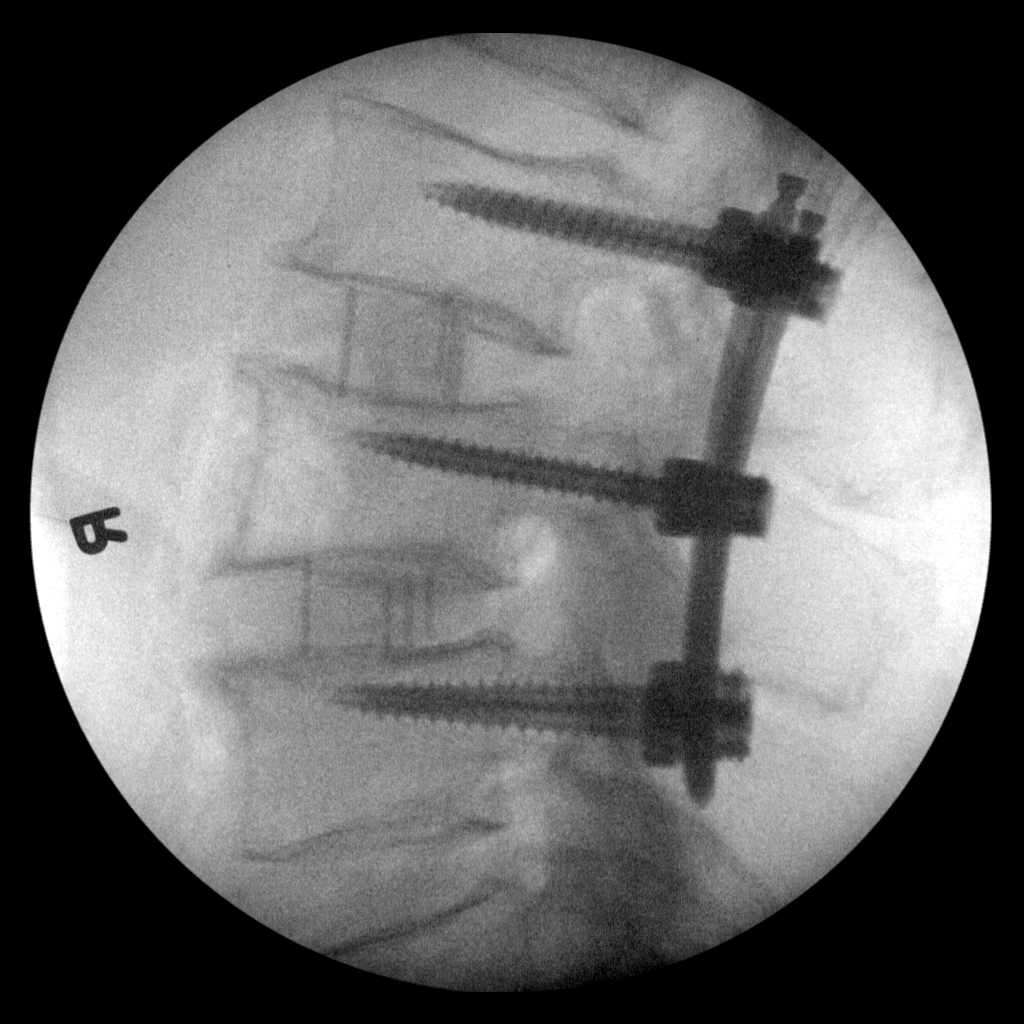
[im 2/3]
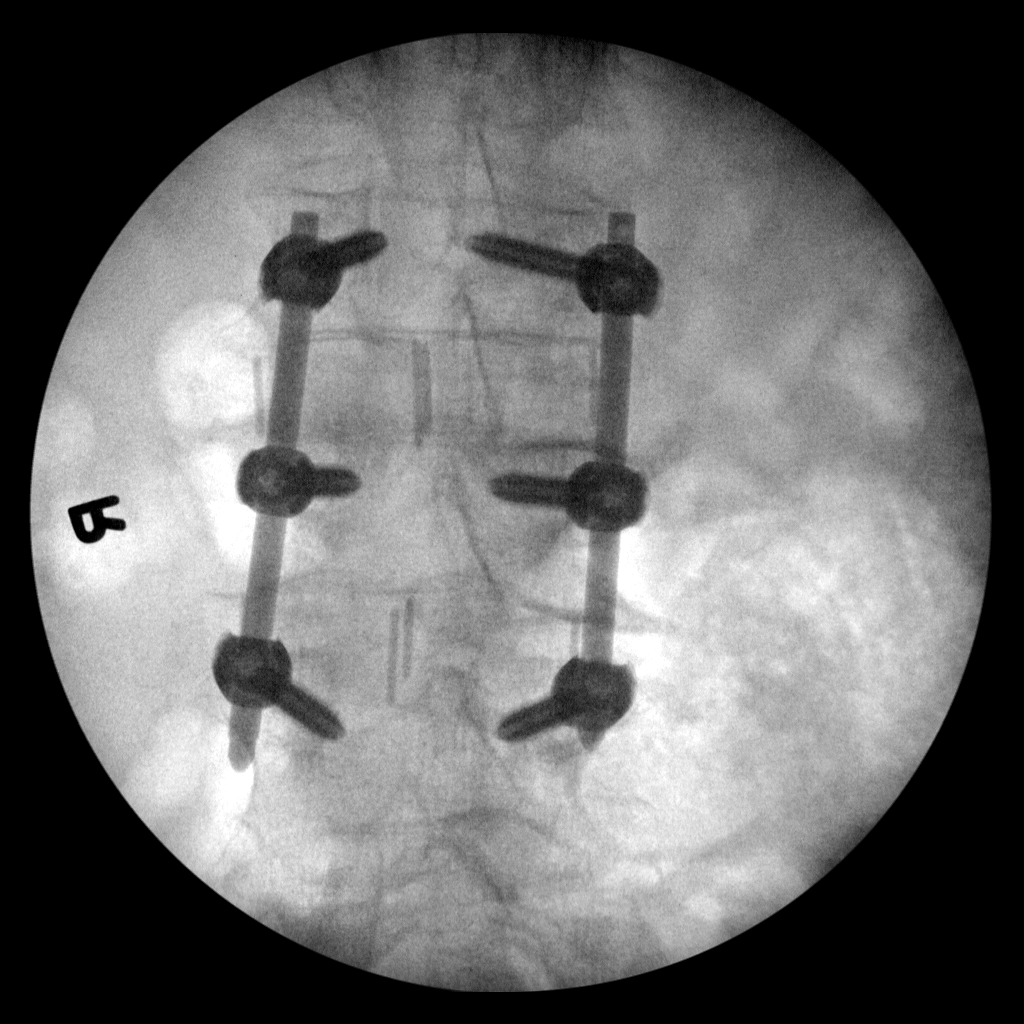
[im 3/3]
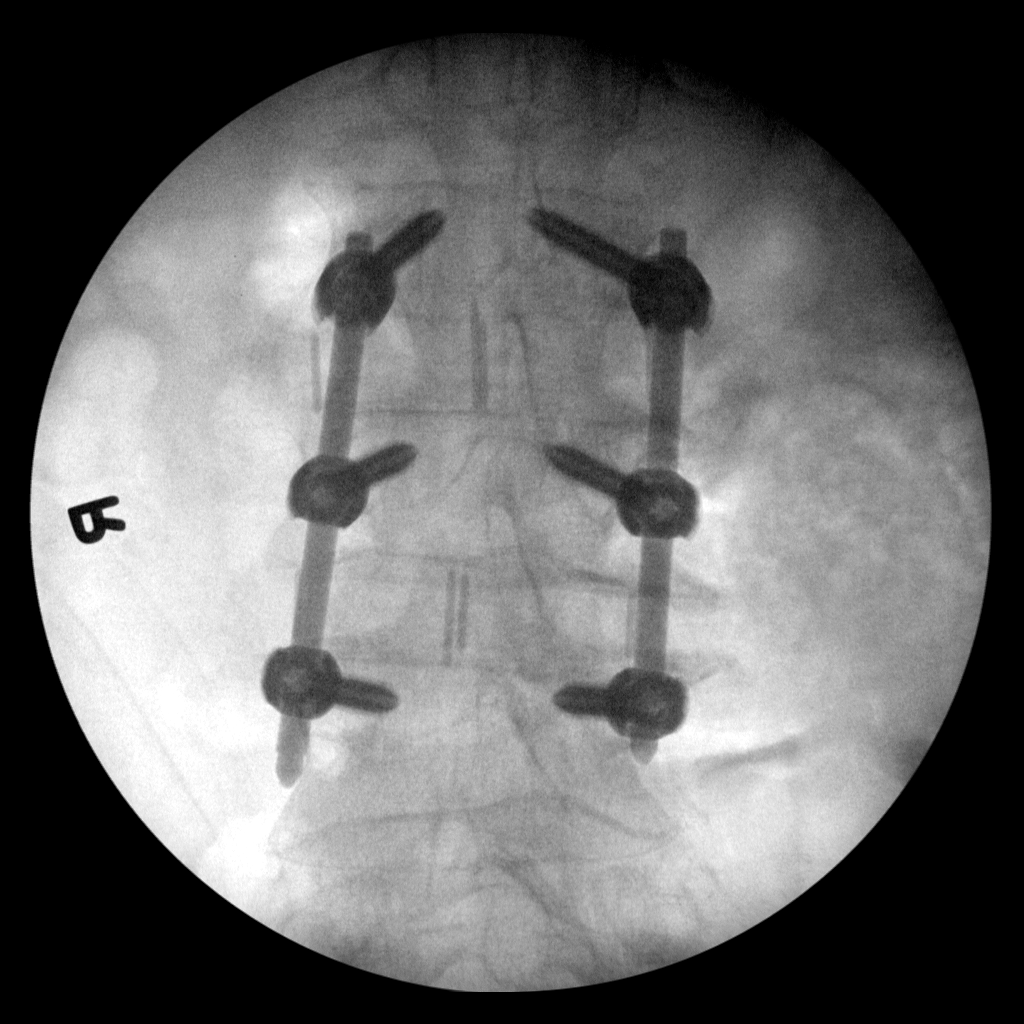

[3 of 3 positions shown; findings below may reference images not displayed]

FINDINGS: Three intraoperative fluoroscopic images of the lumbar spine
demonstrate the patient be status post surgical posterior fusion of
L2-3 and L3-4 with bilateral intrapedicular screw placement and
interbody fusion. Good alignment of vertebral bodies is noted.
IMPRESSION: Negative.

## 2020-05-13 NOTE — Telephone Encounter (Signed)
We received another request for Propranolol refill. Pt last seen 11/2018. I called the pt and he stated he was doing well and was not planning to come back so he will ask his primary care for the refills. I offered to provide 1 month refill until he gets pcp to fill but he reported he has about 2-3 weeks worth of medication. He will call us if he needs [place] again. He verbalized appreciation for the call.

## 2020-05-14 ENCOUNTER — Other Ambulatory Visit: Payer: Self-pay

## 2020-05-14 ENCOUNTER — Encounter: Payer: Self-pay | Admitting: Family Medicine

## 2020-05-14 ENCOUNTER — Ambulatory Visit (INDEPENDENT_AMBULATORY_CARE_PROVIDER_SITE_OTHER): Payer: PPO | Admitting: Family Medicine

## 2020-05-14 VITALS — BP 138/78 | HR 63 | Temp 97.3°F | Ht 72.0 in | Wt 223.0 lb

## 2020-05-14 DIAGNOSIS — R011 Cardiac murmur, unspecified: Secondary | ICD-10-CM | POA: Diagnosis not present

## 2020-05-14 DIAGNOSIS — I10 Essential (primary) hypertension: Secondary | ICD-10-CM

## 2020-05-14 DIAGNOSIS — R7303 Prediabetes: Secondary | ICD-10-CM

## 2020-05-14 DIAGNOSIS — G8929 Other chronic pain: Secondary | ICD-10-CM | POA: Diagnosis not present

## 2020-05-14 DIAGNOSIS — M25512 Pain in left shoulder: Secondary | ICD-10-CM | POA: Diagnosis not present

## 2020-05-14 DIAGNOSIS — H918X3 Other specified hearing loss, bilateral: Secondary | ICD-10-CM | POA: Diagnosis not present

## 2020-05-14 DIAGNOSIS — K219 Gastro-esophageal reflux disease without esophagitis: Secondary | ICD-10-CM | POA: Diagnosis not present

## 2020-05-14 DIAGNOSIS — G43009 Migraine without aura, not intractable, without status migrainosus: Secondary | ICD-10-CM | POA: Diagnosis not present

## 2020-05-14 MED ORDER — PROPRANOLOL HCL 20 MG PO TABS: 20.0000 mg | ORAL_TABLET | Freq: Two times a day (BID) | ORAL | 0 refills | Status: DC

## 2020-05-14 NOTE — Patient Instructions (Addendum)
Ordering echocardiogram to evaluate heart murmur.  Shoulder pain - trial on naproxen 550 mg one twice a day x 14 days.  Referral to ENT sent.

## 2020-05-14 NOTE — Progress Notes (Signed)
echo    Subjective:  Patient ID: Jesse [name]., male    DOB: May 13, 1941  Age: 79 y.o. MRN: [id number]  Chief Complaint  Patient presents with  . Hypertension  . Gastroesophageal Reflux    HPI  Hypertension and migraines: He is well controlled on propranolol 20 mg one twice a day  Patient presents with hyperlipidemia.  Current treatment includes OTC fish oil 1200 gm one twice a day..  Compliance with treatment has been good; he takes his medication as directed, maintains his low cholesterol diet, follows up as directed, and maintains his exercise regimen.  He denies experiencing any hypercholesterolemia related symptoms.      Jesse [name] presents with a diagnosis of impaired fasting glucose.  Eating healthy and exercises.    Jesse [name] presents with a diagnosis of gastro-esophageal reflux disease without esophagitis.  Well controlled with prilosec.   Left shoulder pain for the last couple of days.      Past Medical History:  Diagnosis Date  . Arthritis   . Degenerative lumbar spinal stenosis   . Fatty liver   . GERD (gastroesophageal reflux disease)   . Headache   . History of kidney stones   . Hyperlipidemia   . Hypertension   . Mastodynia   . Migraine without aura and without status migrainosus, not intractable   . Neuritis or radiculitis due to rupture of lumbar intervertebral disc 07/15/2018  . Radiculopathy of thoracolumbar region 07/15/2018   Past Surgical History:  Procedure Laterality Date  . ANTERIOR LATERAL LUMBAR FUSION WITH PERCUTANEOUS SCREW 2 LEVEL N/A 07/13/2018   Procedure: ANTERIOR LATERAL LUMBAR FUSION L2-4;  Surgeon: Jesse Schools, MD;  Location: [facility];  Service: Orthopedics;  Laterality: N/A;  4 hrs  . CHOLECYSTECTOMY  1999  . GALLBLADDER SURGERY  2016  . NECK SURGERY  1995   Ruptured disc repair  . SHOULDER SURGERY Left 2005  . SHOULDER SURGERY Right 2016  . THROAT SURGERY     stretching    Family History  Problem Relation Age of Onset  .  Hypertension Mother   . Cancer Father   . Diabetes Brother   . Migraines Neg Hx    Social History   Socioeconomic History  . Marital status: Married    Spouse name: Jesse [name]  . Number of children: 4  . Years of education: 12+  . Highest education level: Not on file  Occupational History  . Occupation: Retired  Tobacco Use  . Smoking status: Former Smoker    Types: Cigarettes  . Smokeless tobacco: Never Used  . Tobacco comment: 12.2.19-quit at least 40 years ago   Vaping Use  . Vaping Use: Never used  Substance and Sexual Activity  . Alcohol use: No  . Drug use: No  . Sexual activity: Not on file  Other Topics Concern  . Not on file  Social History Narrative   Lives with wife   Caffeine use: Drinks decaf coffee and tea   Writes right-handed, eats and shaves left-handed   Social Determinants of Health   Financial Resource Strain:   . Difficulty of Paying Living Expenses:   Food Insecurity:   . Worried About Charity fundraiser in the Last Year:   . Arboriculturist in the Last Year:   Transportation Needs:   . Film/video editor (Medical):   Marland Kitchen Lack of Transportation (Non-Medical):   Physical Activity:   . Days of Exercise per Week:   . Minutes of Exercise per  Session:   Stress:   . Feeling of Stress :   Social Connections:   . Frequency of Communication with Friends and Family:   . Frequency of Social Gatherings with Friends and Family:   . Attends Religious Services:   . Active Member of Clubs or Organizations:   . Attends Banker Meetings:   Marland Kitchen Marital Status:     Review of Systems  Constitutional: Negative for chills, diaphoresis, fatigue and fever.  HENT: Negative for congestion, ear pain and sore throat.   Respiratory: Negative for cough and shortness of breath.   Cardiovascular: Negative for chest pain and leg swelling.  Gastrointestinal: Negative for abdominal pain, constipation, diarrhea, nausea and vomiting.  Genitourinary: Negative for  dysuria and urgency.  Musculoskeletal: Positive for arthralgias (left shoulder). Negative for myalgias.  Neurological: Positive for headaches (Improved on propranolol. SOme of it is allergies. ). Negative for dizziness.  Psychiatric/Behavioral: Negative for dysphoric mood. The patient is not nervous/anxious.      Objective:  BP 138/78   Pulse 63   Temp (!) 97.3 F (36.3 C)   Ht 6' (1.829 m)   Wt 223 lb (101.2 kg)   SpO2 98%   BMI 30.24 kg/m   BP/Weight 05/14/2020 01/02/2019 11/07/2018  Systolic BP 138 153 140  Diastolic BP 78 70 78  Wt. (Lbs) 223 223.6 222  BMI 30.24 30.33 30.11    Physical Exam Vitals reviewed.  Constitutional:      Appearance: Normal appearance. He is normal weight.  Cardiovascular:     Rate and Rhythm: Normal rate and regular rhythm.     Heart sounds: Murmur (early systolic louder at rt upper sternal border. ) heard.   Pulmonary:     Effort: Pulmonary effort is normal.     Breath sounds: Normal breath sounds.  Abdominal:     General: Abdomen is flat. Bowel sounds are normal.     Palpations: Abdomen is soft.  Musculoskeletal:        General: No tenderness.     Comments: Left shoulder ROM fairly good. Discomfort with abduction and internal rotation.  Neurological:     Mental Status: He is alert and oriented to person, place, and time.  Psychiatric:        Mood and Affect: Mood normal.        Behavior: Behavior normal.     Diabetic Foot Exam - Simple   No data filed       Lab Results  Component Value Date   WBC 5.0 05/15/2020   HGB 15.4 05/15/2020   HCT 47.3 05/15/2020   PLT 147 (L) 05/15/2020   GLUCOSE 103 (H) 05/15/2020   CHOL 165 05/15/2020   TRIG 120 05/15/2020   HDL 42 05/15/2020   LDLCALC 101 (H) 05/15/2020   ALT 26 05/15/2020   AST 23 05/15/2020   NA 143 05/15/2020   K 4.1 05/15/2020   CL 105 05/15/2020   CREATININE 0.94 05/15/2020   BUN 11 05/15/2020   CO2 26 05/15/2020      Assessment & Plan:   1. Essential  hypertension The current medical regimen is effective;  continue present plan and medications. - propranolol (INDERAL) 20 MG tablet; Take 1 tablet (20 mg total) by mouth 2 (two) times daily. Labs today  - Lipid panel - Comprehensive metabolic panel - CBC with Differential/Platelet  2. Migraine without aura and without status migrainosus, not intractable The current medical regimen is effective;  continue present plan and medications. Managed  by Neurology.  3. Prediabetes Recommend continue to work on eating healthy diet and exercise.  4. Gastroesophageal reflux disease without esophagitis The current medical regimen is effective;  continue present plan and medications.  5. Chronic left shoulder pain Consider steroid injection. Recommend tylenol. Trial on naproxen 550 mg one twice a day x 14 days.  6. Other specified hearing loss of both ears  Refer to ENT. Trial on naproxen 550 mg one twice a day x 14 days.   7. Heart murmur Ordering echocardiogram to evaluate heart murmur.    Meds ordered this encounter  Medications  . propranolol (INDERAL) 20 MG tablet    Sig: Take 1 tablet (20 mg total) by mouth 2 (two) times daily. Please have primary care provide further refills or call [phone] to schedule another appointment.    Dispense:  180 tablet    Refill:  0    Orders Placed This Encounter  Procedures  . Lipid panel  . Comprehensive metabolic panel  . CBC with Differential/Platelet  . Cardiovascular Risk Assessment     Follow-up: Return for 10 am tomorrow for fasting labs. Needs Annual Wellness Visit with Selena Batten. .  An After Visit Summary was printed and given to the patient.  Blane Ohara [facility] [phone]

## 2020-05-15 ENCOUNTER — Other Ambulatory Visit: Payer: PPO

## 2020-05-15 DIAGNOSIS — I10 Essential (primary) hypertension: Secondary | ICD-10-CM | POA: Diagnosis not present

## 2020-05-16 LAB — COMPREHENSIVE METABOLIC PANEL
ALT: 26 IU/L (ref 0–44)
AST: 23 IU/L (ref 0–40)
Albumin/Globulin Ratio: 1.6 (ref 1.2–2.2)
Albumin: 4.1 g/dL (ref 3.7–4.7)
Alkaline Phosphatase: 107 IU/L (ref 48–121)
BUN/Creatinine Ratio: 12 (ref 10–24)
BUN: 11 mg/dL (ref 8–27)
Bilirubin Total: 0.5 mg/dL (ref 0.0–1.2)
CO2: 26 mmol/L (ref 20–29)
Calcium: 9.5 mg/dL (ref 8.6–10.2)
Chloride: 105 mmol/L (ref 96–106)
Creatinine, Ser: 0.94 mg/dL (ref 0.76–1.27)
GFR calc Af Amer: 89 mL/min/{1.73_m2} (ref >59)
GFR calc non Af Amer: 77 mL/min/{1.73_m2} (ref >59)
Globulin, Total: 2.6 g/dL (ref 1.5–4.5)
Glucose: 103 mg/dL — ABNORMAL HIGH (ref 65–99)
Potassium: 4.1 mmol/L (ref 3.5–5.2)
Sodium: 143 mmol/L (ref 134–144)
Total Protein: 6.7 g/dL (ref 6.0–8.5)

## 2020-05-16 LAB — CBC WITH DIFFERENTIAL/PLATELET
Basophils Absolute: 0 10*3/uL (ref 0.0–0.2)
Basos: 1 %
EOS (ABSOLUTE): 0.2 10*3/uL (ref 0.0–0.4)
Eos: 3 %
Hematocrit: 47.3 % (ref 37.5–51.0)
Hemoglobin: 15.4 g/dL (ref 13.0–17.7)
Immature Grans (Abs): 0 10*3/uL (ref 0.0–0.1)
Immature Granulocytes: 0 %
Lymphocytes Absolute: 1.2 10*3/uL (ref 0.7–3.1)
Lymphs: 24 %
MCH: 28.4 pg (ref 26.6–33.0)
MCHC: 32.6 g/dL (ref 31.5–35.7)
MCV: 87 fL (ref 79–97)
Monocytes Absolute: 0.4 10*3/uL (ref 0.1–0.9)
Monocytes: 8 %
Neutrophils Absolute: 3.2 10*3/uL (ref 1.4–7.0)
Neutrophils: 64 %
Platelets: 147 10*3/uL — ABNORMAL LOW (ref 150–450)
RBC: 5.43 x10E6/uL (ref 4.14–5.80)
RDW: 14.1 % (ref 11.6–15.4)
WBC: 5 10*3/uL (ref 3.4–10.8)

## 2020-05-16 LAB — LIPID PANEL
Chol/HDL Ratio: 3.9 ratio (ref 0.0–5.0)
Cholesterol, Total: 165 mg/dL (ref 100–199)
HDL: 42 mg/dL (ref >39)
LDL Chol Calc (NIH): 101 mg/dL — ABNORMAL HIGH (ref 0–99)
Triglycerides: 120 mg/dL (ref 0–149)
VLDL Cholesterol Cal: 22 mg/dL (ref 5–40)

## 2020-05-16 LAB — CARDIOVASCULAR RISK ASSESSMENT

## 2020-05-19 ENCOUNTER — Encounter: Payer: Self-pay | Admitting: Family Medicine

## 2020-05-19 DIAGNOSIS — G8929 Other chronic pain: Secondary | ICD-10-CM | POA: Insufficient documentation

## 2020-05-19 DIAGNOSIS — K219 Gastro-esophageal reflux disease without esophagitis: Secondary | ICD-10-CM | POA: Insufficient documentation

## 2020-05-19 DIAGNOSIS — M25512 Pain in left shoulder: Secondary | ICD-10-CM | POA: Insufficient documentation

## 2020-05-19 DIAGNOSIS — H9190 Unspecified hearing loss, unspecified ear: Secondary | ICD-10-CM | POA: Insufficient documentation

## 2020-05-19 DIAGNOSIS — I10 Essential (primary) hypertension: Secondary | ICD-10-CM | POA: Insufficient documentation

## 2020-05-19 DIAGNOSIS — R7303 Prediabetes: Secondary | ICD-10-CM | POA: Insufficient documentation

## 2020-06-03 DIAGNOSIS — H903 Sensorineural hearing loss, bilateral: Secondary | ICD-10-CM | POA: Diagnosis not present

## 2020-06-19 ENCOUNTER — Ambulatory Visit (INDEPENDENT_AMBULATORY_CARE_PROVIDER_SITE_OTHER): Payer: PPO

## 2020-06-19 ENCOUNTER — Other Ambulatory Visit: Payer: Self-pay

## 2020-06-19 VITALS — BP 152/74 | HR 67 | Temp 97.9°F | Resp 16 | Ht 72.0 in | Wt 223.0 lb

## 2020-06-19 DIAGNOSIS — Z0000 Encounter for general adult medical examination without abnormal findings: Secondary | ICD-10-CM

## 2020-06-20 NOTE — Patient Instructions (Signed)

## 2020-06-20 NOTE — Progress Notes (Signed)
Subjective:   Jesse [name]. is a 79 y.o. male who presents for Medicare Annual/Subsequent preventive examination.  This wellness visit is conducted by a nurse.  The patient's medications were reviewed and reconciled since the patient's last visit.  History details were provided by the patient.  The history appears to be reliable.    Patient's last AWV was one year ago.   Medical History: Patient history and Family history was reviewed  Medications, Allergies, and preventative health maintenance was reviewed and updated.   Review of Systems    Review of Systems  Constitutional: Negative.   HENT: Positive for hearing loss.        Corrected with hearing aids  Eyes: Positive for visual disturbance.       Corrected with glasses  Respiratory: Negative.  Negative for cough, chest tightness and shortness of breath.   Cardiovascular: Negative for chest pain and palpitations.       Echo scheduled for heart murmor  Gastrointestinal: Negative.   Genitourinary: Negative.   Musculoskeletal: Negative.   Neurological: Negative.  Negative for dizziness, light-headedness and numbness.  Psychiatric/Behavioral: Negative for confusion, dysphoric mood and suicidal ideas.   Cardiac Risk Factors include: advanced age (>59men, >62 women);dyslipidemia;male gender;obesity (BMI >30kg/m2);hypertension     Objective:    Today's Vitals   06/19/20 1404 06/19/20 1429  BP:  (!) 152/74  Pulse:  67  Resp:  16  Temp:  97.9 F (36.6 C)  TempSrc:  Temporal  SpO2:  97%  Weight:  223 lb (101.2 kg)  Height:  6' (1.829 m)  PainSc: 0-No pain 0-No pain   Body mass index is 30.24 kg/m.  Advanced Directives 06/19/2020 07/13/2018 07/06/2018  Does Patient Have a Medical Advance Directive? Yes Yes Yes  Type of Estate agent of [place];Living will Living will Living will  Does patient want to make changes to medical advance directive? No - Patient declined No - Patient declined No - Patient  declined  Copy of Healthcare Power of Attorney in Chart? No - copy requested - -    Current Medications (verified) Outpatient Encounter Medications as of 06/19/2020  Medication Sig  . OMEGA-3 FATTY ACIDS PO Take 1 capsule by mouth 2 (two) times daily.   Marland Kitchen omeprazole (PRILOSEC) 20 MG capsule Take 20 mg by mouth daily before breakfast.   . propranolol (INDERAL) 20 MG tablet Take 1 tablet (20 mg total) by mouth 2 (two) times daily. Please have primary care provide further refills or call [phone] to schedule another appointment.  . naproxen sodium (ANAPROX) 550 MG tablet Take 550 mg by mouth 2 (two) times daily.   No facility-administered encounter medications on file as of 06/19/2020.    Allergies (verified) Penicillins   History: Past Medical History:  Diagnosis Date  . Arthritis   . Degenerative lumbar spinal stenosis   . Fatty liver   . GERD (gastroesophageal reflux disease)   . Headache   . History of kidney stones   . Hyperlipidemia   . Hypertension   . Mastodynia   . Migraine without aura and without status migrainosus, not intractable   . Neuritis or radiculitis due to rupture of lumbar intervertebral disc 07/15/2018  . Radiculopathy of thoracolumbar region 07/15/2018   Past Surgical History:  Procedure Laterality Date  . ANTERIOR LATERAL LUMBAR FUSION WITH PERCUTANEOUS SCREW 2 LEVEL N/A 07/13/2018   Procedure: ANTERIOR LATERAL LUMBAR FUSION L2-4;  Surgeon: Venita Lick, MD;  Location: [facility];  Service: Orthopedics;  Laterality: N/A;  4 hrs  . CHOLECYSTECTOMY  1999  . GALLBLADDER SURGERY  2016  . NECK SURGERY  1995   Ruptured disc repair  . SHOULDER SURGERY Left 2005  . SHOULDER SURGERY Right 2016  . THROAT SURGERY     stretching   Family History  Problem Relation Age of Onset  . Hypertension Mother   . Cancer Father   . Diabetes Brother   . Migraines Neg Hx    Social History   Socioeconomic History  . Marital status: Married    Spouse name: Darel Hong  .  Number of children: 4  . Years of education: 12+  . Highest education level: Not on file  Occupational History  . Occupation: Retired  Tobacco Use  . Smoking status: Former Smoker    Types: Cigarettes  . Smokeless tobacco: Never Used  . Tobacco comment: 12.2.19-quit at least 40 years ago   Vaping Use  . Vaping Use: Never used  Substance and Sexual Activity  . Alcohol use: No  . Drug use: No  . Sexual activity: Not on file  Other Topics Concern  . Not on file  Social History Narrative   Lives with wife   Caffeine use: Drinks decaf coffee and tea   Writes right-handed, eats and shaves left-handed   Tobacco Counseling Counseling given: Not Answered Comment: 12.2.19-quit at least 40 years ago    Clinical Intake:  Pre-visit preparation completed: Yes  Pain : No/denies pain Pain Score: 0-No pain     BMI - recorded: 30.24 Nutritional Status: BMI > 30  Obese Nutritional Risks: None Diabetes: No  How often do you need to have someone help you when you read instructions, pamphlets, or other written materials from your doctor or pharmacy?: 2 - Rarely  Interpreter Needed?: No      Activities of Daily Living In your present state of health, do you have any difficulty performing the following activities: 06/19/2020 05/14/2020  Hearing? Y Y  Comment Corrected with Hearing Aids has hearing aids  Vision? - N  Difficulty concentrating or making decisions? N N  Walking or climbing stairs? N N  Dressing or bathing? N N  Doing errands, shopping? N N  Preparing Food and eating ? N -  Using the Toilet? N -  In the past six months, have you accidently leaked urine? N -  Do you have problems with loss of bowel control? N -  Managing your Medications? N -  Managing your Finances? N -  Housekeeping or managing your Housekeeping? N -  Some recent data might be hidden    Patient Care Team: Blane Ohara, MD as PCP - General (Family Medicine) Misenheimer, Marcial Pacas, MD as Consulting  Physician (Unknown Physician Specialty)  Indicate any recent Medical Services you may have received from other than [facility] providers in the past year (date may be approximate).     Assessment:   This is a routine wellness examination for Taevon.  Hearing/Vision screen No exam data present  Dietary issues and exercise activities discussed: Current Exercise Habits: Home exercise routine, Type of exercise: walking, Time (Minutes): 10, Frequency (Times/Week): 3, Weekly Exercise (Minutes/Week): 30, Intensity: Mild, Exercise limited by: None identified   Depression Screen PHQ 2/9 Scores 06/19/2020 05/14/2020  PHQ - 2 Score 0 0    Fall Risk Fall Risk  06/19/2020 05/14/2020 11/07/2018 08/12/2016  Falls in the past year? 0 0 0 No  Comment - - - Emmi Telephone Survey: data to providers prior to  load  Number falls in past yr: 0 0 0 -  Injury with Fall? 0 0 0 -  Risk for fall due to : No Fall Risks No Fall Risks - -  Follow up Falls evaluation completed;Falls prevention discussed Falls prevention discussed - -     Any stairs in or around the home? Yes  If so, are there any without handrails? No  Home free of loose throw rugs in walkways, pet beds, electrical cords, etc? Yes  Adequate lighting in your home to reduce risk of falls? Yes   ASSISTIVE DEVICES UTILIZED TO PREVENT FALLS:  Life alert? No  Use of a cane, walker or w/c? No  Grab bars in the bathroom? Yes  Shower chair or bench in shower? No  Elevated toilet seat or a handicapped toilet? No   Cognitive Function:     6CIT Screen 06/19/2020  What Year? 0 points  What month? 0 points  What time? 0 points  Count back from 20 0 points  Months in reverse 2 points  Repeat phrase 0 points  Total Score 2    Immunizations Immunization History  Administered Date(s) Administered  . Influenza, High Dose Seasonal PF 10/20/2018, 10/06/2019  . Influenza-Unspecified 10/26/2018  . Pneumococcal Conjugate-13 09/23/2015  . Pneumococcal  Polysaccharide-23 06/17/2009  . Tdap 07/25/2013    TDAP status: Up to date Flu Vaccine status: Up to date Pneumococcal vaccine status: Up to date Covid-19 vaccine status: Completed vaccines  Screening Tests Health Maintenance  Topic Date Due  . COVID-19 Vaccine (1) Completed (Requested Dates)  . INFLUENZA VACCINE  07/07/2020  . TETANUS/TDAP  07/26/2023  . PNA vac Low Risk Adult  Completed    Health Maintenance  Health Maintenance Due  Topic Date Due  . COVID-19 Vaccine (1) Never done    Colorectal cancer screening: Completed 10/15/16. Repeat every 10 years  Lung Cancer Screening: (Low Dose CT Chest recommended if Age 58-80 years, 30 pack-year currently smoking OR have quit w/in 15years.) does not qualify.    Additional Screening: Vision Screening: Recommended annual exams Dental Screening: Recommended annual dental exams for proper oral hygiene   Plan:    Counseling was provided today regarding the following topics: healthy eating habits, home safety, vitamin and mineral supplementation (Multivitiman), regular exercise, tobacco avoidance, limitation of alcohol intake, use of seat belts, firearm safety, and fall prevention.  Annual recommendations include: influenza vaccine, dental cleanings, and eye exams.   I have personally reviewed and noted the following in the patient's chart:   . Medical and social history . Use of alcohol, tobacco or illicit drugs  . Current medications and supplements . Functional ability and status . Nutritional status . Physical activity . Advanced directives . List of other physicians . Hospitalizations, surgeries, and ER visits in previous 12 months . Vitals . Screenings to include cognitive, depression, and falls . Referrals and appointments  In addition, I have reviewed and discussed with patient certain preventive protocols, quality metrics, and best practice recommendations. A written personalized care plan for preventive  services as well as general preventive health recommendations were provided to patient.     Jacklynn Bue, LPN   [id number]

## 2020-06-21 ENCOUNTER — Encounter: Payer: Self-pay | Admitting: Family Medicine

## 2020-06-21 DIAGNOSIS — R011 Cardiac murmur, unspecified: Secondary | ICD-10-CM | POA: Diagnosis not present

## 2020-06-21 DIAGNOSIS — I350 Nonrheumatic aortic (valve) stenosis: Secondary | ICD-10-CM | POA: Diagnosis not present

## 2020-08-06 ENCOUNTER — Other Ambulatory Visit: Payer: Self-pay | Admitting: Family Medicine

## 2020-08-06 DIAGNOSIS — I10 Essential (primary) hypertension: Secondary | ICD-10-CM

## 2020-08-19 ENCOUNTER — Telehealth (INDEPENDENT_AMBULATORY_CARE_PROVIDER_SITE_OTHER): Payer: PPO | Admitting: Family Medicine

## 2020-08-19 ENCOUNTER — Encounter: Payer: Self-pay | Admitting: Family Medicine

## 2020-08-19 VITALS — HR 71 | Temp 98.8°F | Ht 70.0 in | Wt 220.0 lb

## 2020-08-19 DIAGNOSIS — Z20822 Contact with and (suspected) exposure to covid-19: Secondary | ICD-10-CM | POA: Diagnosis not present

## 2020-08-19 LAB — POC COVID19 BINAXNOW: SARS Coronavirus 2 Ag: POSITIVE — AB

## 2020-08-19 MED ORDER — BENZONATATE 200 MG PO CAPS
200.0000 mg | ORAL_CAPSULE | Freq: Two times a day (BID) | ORAL | 0 refills | Status: DC | PRN
Start: 2020-08-19 — End: 2020-10-01

## 2020-08-19 NOTE — Addendum Note (Signed)
Addended by: Jacklynn Bue on: 08/19/2020 02:13 PM   Modules accepted: Orders

## 2020-08-19 NOTE — Progress Notes (Signed)
Virtual Visit via Telephone Note  I connected with Jesse [name]. on 08/19/20 at 11:30 AM EDT by telephone and verified that I am speaking with the correct person using two identifiers.  Location: Patient: home Provider: [facility]   I discussed the limitations, risks, security and privacy concerns of performing an evaluation and management service by telephone and the availability of in person appointments. I also discussed with the patient that there may be a patient responsible charge related to this service. The patient expressed understanding and agreed to proceed.   History of Present Illness: Pt with cough, congestion since Sat. Pt states headache today. Fever noted yesterday 101. Pt taking tylenol last night. Oxygen level 94% -no SOB. Pt with no diarrhea, no rash, no loss of taste or smell. Pt is able to tolerate fluids and limited food. Pts wife + for COVID-notified this morning. Pt denies he and his wife had exposure to COVID and both had Pfizer 12/20/19 and 01/15/20.     Observations/Objective: T 98.8 this morning, P 74, wt 220, ht 70in, sat 94% No exam today  Assessment and Plan: 1. Exposure to COVID-19 virus +COVID test -wife -notified this morning-cough, congestion, fever-NO SOB-tylenol, flonase, tessalon perles Follow Up Instructions: Rapid COVID test today at office   I discussed the assessment and treatment plan with the patient. The patient was provided an opportunity to ask questions and all were answered. The patient agreed with the plan and demonstrated an understanding of the instructions.   The patient was advised to call back or seek an in-person evaluation if the symptoms worsen or if the condition fails to improve as anticipated.  I provided 9 minutes of non-face-to-face time during this encounter.   Oseias Horsey Mat Carne, MD

## 2020-08-26 ENCOUNTER — Encounter: Payer: Self-pay | Admitting: Family Medicine

## 2020-09-02 ENCOUNTER — Other Ambulatory Visit: Payer: Self-pay

## 2020-09-02 ENCOUNTER — Ambulatory Visit (INDEPENDENT_AMBULATORY_CARE_PROVIDER_SITE_OTHER): Payer: PPO | Admitting: Family Medicine

## 2020-09-02 ENCOUNTER — Encounter (HOSPITAL_COMMUNITY): Payer: Self-pay | Admitting: *Deleted

## 2020-09-02 ENCOUNTER — Encounter: Payer: Self-pay | Admitting: Family Medicine

## 2020-09-02 ENCOUNTER — Observation Stay (HOSPITAL_COMMUNITY)
Admission: EM | Admit: 2020-09-02 | Discharge: 2020-09-03 | Disposition: A | Discharge status: Home / Self Care | Payer: PPO | Attending: Internal Medicine | Admitting: Internal Medicine

## 2020-09-02 VITALS — BP 144/72 | HR 72 | Temp 97.2°F | Resp 16 | Ht 72.0 in | Wt 219.2 lb

## 2020-09-02 DIAGNOSIS — R0789 Other chest pain: Secondary | ICD-10-CM | POA: Diagnosis not present

## 2020-09-02 DIAGNOSIS — N644 Mastodynia: Secondary | ICD-10-CM | POA: Diagnosis present

## 2020-09-02 DIAGNOSIS — J9601 Acute respiratory failure with hypoxia: Secondary | ICD-10-CM

## 2020-09-02 DIAGNOSIS — I160 Hypertensive urgency: Secondary | ICD-10-CM | POA: Diagnosis not present

## 2020-09-02 DIAGNOSIS — I2510 Atherosclerotic heart disease of native coronary artery without angina pectoris: Secondary | ICD-10-CM | POA: Diagnosis not present

## 2020-09-02 DIAGNOSIS — R109 Unspecified abdominal pain: Secondary | ICD-10-CM

## 2020-09-02 DIAGNOSIS — R071 Chest pain on breathing: Secondary | ICD-10-CM | POA: Diagnosis not present

## 2020-09-02 DIAGNOSIS — I10 Essential (primary) hypertension: Secondary | ICD-10-CM | POA: Diagnosis not present

## 2020-09-02 DIAGNOSIS — Z9981 Dependence on supplemental oxygen: Secondary | ICD-10-CM | POA: Insufficient documentation

## 2020-09-02 DIAGNOSIS — R52 Pain, unspecified: Secondary | ICD-10-CM | POA: Diagnosis not present

## 2020-09-02 DIAGNOSIS — Z87891 Personal history of nicotine dependence: Secondary | ICD-10-CM | POA: Insufficient documentation

## 2020-09-02 DIAGNOSIS — R069 Unspecified abnormalities of breathing: Secondary | ICD-10-CM | POA: Diagnosis not present

## 2020-09-02 DIAGNOSIS — U071 COVID-19: Principal | ICD-10-CM | POA: Diagnosis present

## 2020-09-02 DIAGNOSIS — I2609 Other pulmonary embolism with acute cor pulmonale: Secondary | ICD-10-CM | POA: Diagnosis not present

## 2020-09-02 DIAGNOSIS — J189 Pneumonia, unspecified organism: Secondary | ICD-10-CM

## 2020-09-02 DIAGNOSIS — R5383 Other fatigue: Secondary | ICD-10-CM

## 2020-09-02 DIAGNOSIS — R600 Localized edema: Secondary | ICD-10-CM | POA: Diagnosis not present

## 2020-09-02 DIAGNOSIS — I2699 Other pulmonary embolism without acute cor pulmonale: Secondary | ICD-10-CM | POA: Diagnosis not present

## 2020-09-02 DIAGNOSIS — R079 Chest pain, unspecified: Secondary | ICD-10-CM | POA: Diagnosis not present

## 2020-09-02 DIAGNOSIS — J9811 Atelectasis: Secondary | ICD-10-CM | POA: Diagnosis not present

## 2020-09-02 DIAGNOSIS — R918 Other nonspecific abnormal finding of lung field: Secondary | ICD-10-CM | POA: Diagnosis not present

## 2020-09-02 DIAGNOSIS — J90 Pleural effusion, not elsewhere classified: Secondary | ICD-10-CM | POA: Diagnosis not present

## 2020-09-02 DIAGNOSIS — Z7901 Long term (current) use of anticoagulants: Secondary | ICD-10-CM | POA: Diagnosis not present

## 2020-09-02 DIAGNOSIS — I700 Atherosclerosis of aorta: Secondary | ICD-10-CM | POA: Diagnosis not present

## 2020-09-02 DIAGNOSIS — R0600 Dyspnea, unspecified: Secondary | ICD-10-CM

## 2020-09-02 DIAGNOSIS — R0609 Other forms of dyspnea: Secondary | ICD-10-CM

## 2020-09-02 DIAGNOSIS — R0902 Hypoxemia: Secondary | ICD-10-CM | POA: Diagnosis not present

## 2020-09-02 DIAGNOSIS — R0602 Shortness of breath: Secondary | ICD-10-CM | POA: Diagnosis not present

## 2020-09-02 DIAGNOSIS — K219 Gastro-esophageal reflux disease without esophagitis: Secondary | ICD-10-CM | POA: Diagnosis not present

## 2020-09-02 HISTORY — DX: Other pulmonary embolism without acute cor pulmonale: I26.99

## 2020-09-02 LAB — CBC WITH DIFFERENTIAL/PLATELET
Abs Immature Granulocytes: 0.05 10*3/uL (ref 0.00–0.07)
Basophils Absolute: 0 10*3/uL (ref 0.0–0.1)
Basophils Relative: 1 %
Eosinophils Absolute: 0.1 10*3/uL (ref 0.0–0.5)
Eosinophils Relative: 2 %
HCT: 41.6 % (ref 39.0–52.0)
Hemoglobin: 14 g/dL (ref 13.0–17.0)
Immature Granulocytes: 1 %
Lymphocytes Relative: 18 %
Lymphs Abs: 1.2 10*3/uL (ref 0.7–4.0)
MCH: 28.9 pg (ref 26.0–34.0)
MCHC: 33.7 g/dL (ref 30.0–36.0)
MCV: 86 fL (ref 80.0–100.0)
Monocytes Absolute: 0.6 10*3/uL (ref 0.1–1.0)
Monocytes Relative: 9 %
Neutro Abs: 4.8 10*3/uL (ref 1.7–7.7)
Neutrophils Relative %: 69 %
Platelets: 249 10*3/uL (ref 150–400)
RBC: 4.84 MIL/uL (ref 4.22–5.81)
RDW: 13.1 % (ref 11.5–15.5)
WBC: 6.8 10*3/uL (ref 4.0–10.5)
nRBC: 0 % (ref 0.0–0.2)

## 2020-09-02 LAB — POCT URINALYSIS DIPSTICK
Bilirubin, UA: NEGATIVE
Blood, UA: NEGATIVE
Glucose, UA: NEGATIVE
Ketones, UA: NEGATIVE
Leukocytes, UA: NEGATIVE
Nitrite, UA: NEGATIVE
Protein, UA: POSITIVE — AB
Spec Grav, UA: 1.015 (ref 1.010–1.025)
Urobilinogen, UA: NEGATIVE E.U./dL — AB
pH, UA: 6.5 (ref 5.0–8.0)

## 2020-09-02 LAB — APTT: aPTT: 44 seconds — ABNORMAL HIGH (ref 24–36)

## 2020-09-02 LAB — BRAIN NATRIURETIC PEPTIDE: B Natriuretic Peptide: 80.4 pg/mL (ref 0.0–100.0)

## 2020-09-02 LAB — BASIC METABOLIC PANEL
Anion gap: 9 (ref 5–15)
BUN: 10 mg/dL (ref 8–23)
CO2: 26 mmol/L (ref 22–32)
Calcium: 9.1 mg/dL (ref 8.9–10.3)
Chloride: 103 mmol/L (ref 98–111)
Creatinine, Ser: 0.82 mg/dL (ref 0.61–1.24)
GFR calc Af Amer: >60 mL/min (ref >60)
GFR calc non Af Amer: >60 mL/min (ref >60)
Glucose, Bld: 108 mg/dL — ABNORMAL HIGH (ref 70–99)
Potassium: 3.9 mmol/L (ref 3.5–5.1)
Sodium: 138 mmol/L (ref 135–145)

## 2020-09-02 LAB — TROPONIN I (HIGH SENSITIVITY): Troponin I (High Sensitivity): 6 ng/L (ref <18)

## 2020-09-02 LAB — PROTIME-INR
INR: 1.6 — ABNORMAL HIGH (ref 0.8–1.2)
Prothrombin Time: 18.7 seconds — ABNORMAL HIGH (ref 11.4–15.2)

## 2020-09-02 MED ORDER — ALBUTEROL SULFATE HFA 108 (90 BASE) MCG/ACT IN AERS: 2.0000 | INHALATION_SPRAY | RESPIRATORY_TRACT | Status: DC | PRN

## 2020-09-02 MED ORDER — HYDRALAZINE HCL 20 MG/ML IJ SOLN: 10.0000 mg | Freq: Four times a day (QID) | INTRAMUSCULAR | Status: DC | PRN

## 2020-09-02 MED ORDER — ONDANSETRON HCL 4 MG/2ML IJ SOLN: 4.0000 mg | Freq: Four times a day (QID) | INTRAMUSCULAR | Status: DC | PRN

## 2020-09-02 MED ORDER — OXYCODONE-ACETAMINOPHEN 5-325 MG PO TABS: 1.0000 | ORAL_TABLET | ORAL | Status: DC | PRN

## 2020-09-02 MED ORDER — ONDANSETRON HCL 4 MG PO TABS: 4.0000 mg | ORAL_TABLET | Freq: Four times a day (QID) | ORAL | Status: DC | PRN

## 2020-09-02 MED ORDER — ASCORBIC ACID 500 MG PO TABS
500.0000 mg | ORAL_TABLET | Freq: Every day | ORAL | Status: DC
  Administered 2020-09-03: 500 mg via ORAL
  Filled 2020-09-02: qty 1

## 2020-09-02 MED ORDER — HEPARIN BOLUS VIA INFUSION
3000.0000 [IU] | INTRAVENOUS | Status: AC
  Administered 2020-09-02: 3000 [IU] via INTRAVENOUS
  Filled 2020-09-02: qty 3000

## 2020-09-02 MED ORDER — GUAIFENESIN-DM 100-10 MG/5ML PO SYRP: 10.0000 mL | ORAL_SOLUTION | ORAL | Status: DC | PRN

## 2020-09-02 MED ORDER — PROPRANOLOL HCL 20 MG PO TABS
20.0000 mg | ORAL_TABLET | Freq: Two times a day (BID) | ORAL | Status: DC
  Administered 2020-09-02 – 2020-09-03 (×2): 20 mg via ORAL
  Filled 2020-09-02 (×2): qty 1

## 2020-09-02 MED ORDER — DEXAMETHASONE SODIUM PHOSPHATE 10 MG/ML IJ SOLN
6.0000 mg | INTRAMUSCULAR | Status: DC
  Administered 2020-09-03: 6 mg via INTRAVENOUS
  Filled 2020-09-02: qty 1

## 2020-09-02 MED ORDER — CEFTRIAXONE SODIUM 1 G IJ SOLR
1.0000 g | Freq: Once | INTRAMUSCULAR | Status: AC
  Administered 2020-09-02: 1 g via INTRAMUSCULAR

## 2020-09-02 MED ORDER — LACTATED RINGERS IV SOLN: INTRAVENOUS | Status: DC

## 2020-09-02 MED ORDER — MORPHINE SULFATE (PF) 2 MG/ML IV SOLN: 2.0000 mg | INTRAVENOUS | Status: DC | PRN

## 2020-09-02 MED ORDER — ZINC SULFATE 220 (50 ZN) MG PO CAPS
220.0000 mg | ORAL_CAPSULE | Freq: Every day | ORAL | Status: DC
  Administered 2020-09-03: 220 mg via ORAL
  Filled 2020-09-02: qty 1

## 2020-09-02 MED ORDER — HEPARIN (PORCINE) 25000 UT/250ML-% IV SOLN
1550.0000 [IU]/h | INTRAVENOUS | Status: DC
  Administered 2020-09-02: 1550 [IU]/h via INTRAVENOUS
  Filled 2020-09-02 (×2): qty 250

## 2020-09-02 MED ORDER — POLYETHYLENE GLYCOL 3350 17 G PO PACK: 17.0000 g | PACK | Freq: Every day | ORAL | Status: DC | PRN

## 2020-09-02 MED ORDER — ACETAMINOPHEN 325 MG PO TABS
650.0000 mg | ORAL_TABLET | ORAL | Status: DC | PRN
  Administered 2020-09-03: 650 mg via ORAL
  Filled 2020-09-02: qty 2

## 2020-09-02 MED ORDER — PANTOPRAZOLE SODIUM 40 MG PO TBEC
40.0000 mg | DELAYED_RELEASE_TABLET | Freq: Every day | ORAL | Status: DC
  Administered 2020-09-03: 40 mg via ORAL
  Filled 2020-09-02: qty 1

## 2020-09-02 NOTE — ED Notes (Signed)
RN noted pt BP at 232/115. MD notified. Propanolol ordered and administered to pt.

## 2020-09-02 NOTE — H&P (Addendum)
History and Physical    Jesse [name] D Jesse Jr. UEA:[id number] DOB: 05/06/1941 DOA: 09/02/2020  PCP: Jesse [name], Kirsten, MD  Patient coming from: Home   Chief Complaint:  Chief Complaint  Patient presents with  . Shortness of Breath     HPI:    79 year old male with past medical history of gastroesophageal reflux disease, hypertension, migraine headaches and low back pain who presents to [facility] emergency department after being sent by his primary care provider due to concern for acute pulmonary embolism.  Patient states that on approximately September 12 he began to develop shortness of breath and dry nonproductive cough.  This was associated with generalized weakness, malaise and poor appetite.  Patient also complains of associated fevers.  Patient was diagnosed with COVID-19 on September 13.  He was sent home for conservative management.  Despite resting at home, patient's shortness of breath and cough continued to persist.  Patient continued to experience generalized weakness and poor appetite.  Patient states that his fevers did subside though after several days.  Approximately 2 days ago, patient awoke from sleep with intense left-sided chest pain.  Patient describes his pain as severe in intensity, sharp in quality, located in the left lateral chest and nonradiating.  Pain seems to be worse with movement and occasionally worse with cough or deep inspiration.  Patient's pain has waxed and waned for the following 48 hours.  Patient eventually presented to see his primary care provider who performed a thorough outpatient assessment which eventually led to a CT angiogram of the chest that identified a left lower lobe acute pulmonary embolism.  While the patient was being evaluated at his primary care provider's office he was noted to be hypoxic with minimal exertion.  The patient was then sent to [facility] department for evaluation.  Upon evaluation in the  emergency department after the results of the CT pulmonary angiogram were confirmed, patient was initiated on intravenous heparin infusion.  The hospitalist group was then called to assess the patient for admission to the hospital.  Review of Systems:   Review of Systems  Constitutional: Positive for fever and malaise/fatigue.  Respiratory: Positive for cough and shortness of breath.   Neurological: Positive for weakness.  All other systems reviewed and are negative.   Past Medical History:  Diagnosis Date  . Arthritis   . Degenerative lumbar spinal stenosis   . Fatty liver   . GERD (gastroesophageal reflux disease)   . Headache   . History of kidney stones   . Hyperlipidemia   . Hypertension   . Mastodynia   . Migraine without aura and without status migrainosus, not intractable   . Neuritis or radiculitis due to rupture of lumbar intervertebral disc 07/15/2018  . Radiculopathy of thoracolumbar region 07/15/2018    Past Surgical History:  Procedure Laterality Date  . ANTERIOR LATERAL LUMBAR FUSION WITH PERCUTANEOUS SCREW 2 LEVEL N/A 07/13/2018   Procedure: ANTERIOR LATERAL LUMBAR FUSION L2-4;  Surgeon: Jesse [name], Dahari, MD;  Location: [facility] OR;  Service: Orthopedics;  Laterality: N/A;  4 hrs  . CHOLECYSTECTOMY  1999  . GALLBLADDER SURGERY  2016  . NECK SURGERY  1995   Ruptured disc repair  . SHOULDER SURGERY Left 2005  . SHOULDER SURGERY Right 2016  . THROAT SURGERY     stretching     reports that he has quit smoking. His smoking use included cigarettes. He has never used smokeless tobacco. He reports that he does not drink alcohol and  does not use drugs.  Allergies  Allergen Reactions  . Penicillins Rash    Family History  Problem Relation Age of Onset  . Hypertension Mother   . Cancer Father   . Diabetes Brother   . Migraines Neg Hx      Prior to Admission medications   Medication Sig Start Date End Date Taking? Authorizing Provider  Ascorbic Acid (VITAMIN C)  1000 MG tablet Take 1,000 mg by mouth daily.   Yes [provider]  omeprazole (PRILOSEC) 20 MG capsule Take 20 mg by mouth daily before breakfast.  09/21/16  Yes [provider]  propranolol (INDERAL) 20 MG tablet TAKE 1 TABLET (20 MG TOTAL) BY MOUTH 2 (TWO) TIMES DAILY Patient taking differently: Take 20 mg by mouth 2 (two) times daily.  08/06/20  Yes Cox, Kirsten, MD  Zinc Sulfate (ZINC-220 PO) Take 1 capsule by mouth daily.    Yes [provider]  benzonatate (TESSALON) 200 MG capsule Take 1 capsule (200 mg total) by mouth 2 (two) times daily as needed for cough. Patient not taking: Reported on 09/02/2020 08/19/20   Jesse Feinstein, MD    Physical Exam: Vitals:   09/02/20 2130 09/02/20 2221 09/02/20 2230 09/02/20 2245  BP: (!) 192/104 (!) 159/139 (!) 166/84 (!) 161/80  Pulse: 77 81 97 81  Resp: (!) 30  (!) 35 (!) 29  Temp:      TempSrc:      SpO2: 96%  (!) 89% 95%  Weight:      Height:        Constitutional: Acute alert and oriented x3, no associated distress.   Skin: no rashes, no lesions, good skin turgor noted. Eyes: Pupils are equally reactive to light.  No evidence of scleral icterus or conjunctival pallor.  ENMT: Moist mucous membranes noted.  Posterior pharynx clear of any exudate or lesions.   Neck: normal, supple, no masses, no thyromegaly.  No evidence of jugular venous distension.   Respiratory: Notable bibasilar rales without evidence of concurrent wheezing.  Normal respiratory effort. No accessory muscle use.  Cardiovascular: Regular rate and rhythm, no murmurs / rubs / gallops. No extremity edema. 2+ pedal pulses. No carotid bruits.  Chest:   Exquisite left lateral chest wall tenderness noted without evidence of  crepitus or deformity.   Back:   Nontender without crepitus or deformity. Abdomen: Abdomen is soft and nontender.  No evidence of intra-abdominal masses.  Positive bowel sounds noted in all quadrants.   Musculoskeletal: No joint  deformity upper and lower extremities. Good ROM, no contractures. Normal muscle tone.  Neurologic: CN 2-12 grossly intact. Sensation intact.  Patient moving all 4 extremities spontaneously.  Patient is following all commands.  Patient is responsive to verbal stimuli.   Psychiatric: Patient exhibits normal mood with appropriate affect.  Patient seems to possess insight as to their current situation.     Labs on Admission: I have personally reviewed following labs and imaging studies -   CBC: Recent Labs  Lab 09/02/20 1850  WBC 6.8  NEUTROABS 4.8  HGB 14.0  HCT 41.6  MCV 86.0  PLT 249   Basic Metabolic Panel: Recent Labs  Lab 09/02/20 1850  NA 138  K 3.9  CL 103  CO2 26  GLUCOSE 108*  BUN 10  CREATININE 0.82  CALCIUM 9.1   GFR: Estimated Creatinine Clearance: 89.2 mL/min (by C-G formula based on SCr of 0.82 mg/dL). Liver Function Tests: No results for input(s): AST, ALT, ALKPHOS, BILITOT,  PROT, ALBUMIN in the last 168 hours. No results for input(s): LIPASE, AMYLASE in the last 168 hours. No results for input(s): AMMONIA in the last 168 hours. Coagulation Profile: Recent Labs  Lab 09/02/20 1850  INR 1.6*   Cardiac Enzymes: No results for input(s): CKTOTAL, CKMB, CKMBINDEX, TROPONINI in the last 168 hours. BNP (last 3 results) No results for input(s): PROBNP in the last 8760 hours. HbA1C: No results for input(s): HGBA1C in the last 72 hours. CBG: No results for input(s): GLUCAP in the last 168 hours. Lipid Profile: No results for input(s): CHOL, HDL, LDLCALC, TRIG, CHOLHDL, LDLDIRECT in the last 72 hours. Thyroid Function Tests: No results for input(s): TSH, T4TOTAL, FREET4, T3FREE, THYROIDAB in the last 72 hours. Anemia Panel: No results for input(s): VITAMINB12, FOLATE, FERRITIN, TIBC, IRON, RETICCTPCT in the last 72 hours. Urine analysis:    Component Value Date/Time   BILIRUBINUR neg 09/02/2020 0959   PROTEINUR Positive (A) 09/02/2020 0959   UROBILINOGEN  negative (A) 09/02/2020 0959   NITRITE neg 09/02/2020 0959   LEUKOCYTESUR Negative 09/02/2020 0959    Radiological Exams on Admission - Personally Reviewed: No results found.  EKG: Personally reviewed.  Rhythm is normal sinus rhythm with heart rate of 67 bpm.  No dynamic ST segment changes appreciated.  Assessment/Plan Principal Problem:   Acute pulmonary embolism without acute cor pulmonale (HCC)   Patient is presenting with several day history of left lateral chest wall pain in the setting of over a 1 week history of shortness of breath after recent diagnosis of COVID-19  Patient noted to have markedly elevated D-dimer at his primary care provider's office along with CT angiogram of the chest confirming a left lower lobe acute pulmonary embolism.  Development of acute pulmonary embolism thought to be secondary to diagnosis of COVID-19 just several days prior.  Patient has initially been placed on a heparin drip by the emergency department staff.  We will continue this for now and allow the day team to quickly transition the patient to an oral anticoagulant.  Patient does exhibit hypoxia with minimal exertion but does not require oxygen at rest.  Obtaining serial troponins due to chest discomfort, monitoring on telemetry.  As needed opiate-based analgesics for associated left chest wall pain -no significant evidence of pulmonary infarction on CT.  COVID-19 virus infection   Recent diagnosis on 9/13, 14 days ago.  While patient is outside of the 10-day window, we have extended patient's isolation status to 21 days due to the ambiguity as to whether or not patient's continued shortness of breath and cough are secondary to residual COVID-19 infection or secondary to the pulmonary embolism.  Due to lack of hypoxia at rest we have not opted to initiate remdesivir or dexamethasone at this time.  Providing patient with vitamin C and zinc  Parent bronchodilator therapy via  MDI  Admitting to COVID-19 unit for now.  Active Problems:   Hypertensive urgency   Markedly elevated blood pressures in the emergency department without evidence of endorgan injury  Restarting home regimen of antihypertensive therapy  Treating underlying pain with as needed analgesics  Provided patient with as needed intravenous antihypertensives for excessively elevated blood pressures.    Gastroesophageal reflux disease without esophagitis    Continue home regimen of PPI  Chest pain   Chest pain unlikely to be secondary to plaque rupture and more likely secondary to acute pulmonary embolism.  Cycling cardiac enzymes  Monitoring patient on telemetry  Code Status:  Full code Family  Communication: Daughter has already been updated on plan of care by the emergency department staff.  Status is: Observation  The patient remains OBS appropriate and will d/c before 2 midnights.  Dispo: The patient is from: Home              Anticipated d/c is to: Home              Anticipated d/c date is: 2 days              Patient currently is not medically stable to d/c.        Marinda Elk MD [facility] Pager [phone]  If 7PM-7AM, please contact night-coverage [email or web address] Use universal [place] password for that web site. If you do not have the password, please call the hospital operator.  09/02/2020, 11:45 PM

## 2020-09-02 NOTE — Addendum Note (Signed)
Addended by: Arman Bogus A on: 09/02/2020 04:59 PM   Modules accepted: Orders

## 2020-09-02 NOTE — Progress Notes (Addendum)
Acute Office Visit  Subjective:    Patient ID: Jesse [name]., male    DOB: 05/07/1941, 79 y.o.   MRN: [id number]  Chief Complaint  Patient presents with  . post covid  . left sided pain    HPI Patient is in today for covid 19 follow up. Positive covid test 08/19/2020. Has left sided chest pain with coughing and with breathing. Started Friday night. Patient is having shortness of breath. Not coughing as hard. No fever. Pt is exhausted. Eating ok and drinking fluids. No loss of sense of taste or smell.   Past Medical History:  Diagnosis Date  . Arthritis   . Degenerative lumbar spinal stenosis   . Fatty liver   . GERD (gastroesophageal reflux disease)   . Headache   . History of kidney stones   . Hyperlipidemia   . Hypertension   . Mastodynia   . Migraine without aura and without status migrainosus, not intractable   . Neuritis or radiculitis due to rupture of lumbar intervertebral disc 07/15/2018  . Radiculopathy of thoracolumbar region 07/15/2018    Past Surgical History:  Procedure Laterality Date  . ANTERIOR LATERAL LUMBAR FUSION WITH PERCUTANEOUS SCREW 2 LEVEL N/A 07/13/2018   Procedure: ANTERIOR LATERAL LUMBAR FUSION L2-4;  Surgeon: Venita Lick, MD;  Location: [facility] OR;  Service: Orthopedics;  Laterality: N/A;  4 hrs  . CHOLECYSTECTOMY  1999  . GALLBLADDER SURGERY  2016  . NECK SURGERY  1995   Ruptured disc repair  . SHOULDER SURGERY Left 2005  . SHOULDER SURGERY Right 2016  . THROAT SURGERY     stretching    Family History  Problem Relation Age of Onset  . Hypertension Mother   . Cancer Father   . Diabetes Brother   . Migraines Neg Hx     Social History   Socioeconomic History  . Marital status: Married    Spouse name: Darel Hong  . Number of children: 4  . Years of education: 12+  . Highest education level: Not on file  Occupational History  . Occupation: Retired  Tobacco Use  . Smoking status: Former Smoker    Types: Cigarettes  . Smokeless  tobacco: Never Used  . Tobacco comment: 12.2.19-quit at least 40 years ago   Vaping Use  . Vaping Use: Never used  Substance and Sexual Activity  . Alcohol use: No  . Drug use: No  . Sexual activity: Not on file  Other Topics Concern  . Not on file  Social History Narrative   Lives with wife   Caffeine use: Drinks decaf coffee and tea   Writes right-handed, eats and shaves left-handed   Social Determinants of Health   Financial Resource Strain:   . Difficulty of Paying Living Expenses: Not on file  Food Insecurity:   . Worried About Programme researcher, broadcasting/film/video in the Last Year: Not on file  . Ran Out of Food in the Last Year: Not on file  Transportation Needs:   . Lack of Transportation (Medical): Not on file  . Lack of Transportation (Non-Medical): Not on file  Physical Activity:   . Days of Exercise per Week: Not on file  . Minutes of Exercise per Session: Not on file  Stress:   . Feeling of Stress : Not on file  Social Connections:   . Frequency of Communication with Friends and Family: Not on file  . Frequency of Social Gatherings with Friends and Family: Not on file  . Attends  Religious Services: Not on file  . Active Member of Clubs or Organizations: Not on file  . Attends Banker Meetings: Not on file  . Marital Status: Not on file  Intimate Partner Violence:   . Fear of Current or Ex-Partner: Not on file  . Emotionally Abused: Not on file  . Physically Abused: Not on file  . Sexually Abused: Not on file    Outpatient Medications Prior to Visit  Medication Sig Dispense Refill  . Ascorbic Acid (VITAMIN C) 1000 MG tablet Take 1,000 mg by mouth daily.    . Zinc Sulfate (ZINC-220 PO) Take by mouth.    . benzonatate (TESSALON) 200 MG capsule Take 1 capsule (200 mg total) by mouth 2 (two) times daily as needed for cough. 30 capsule 0  . omeprazole (PRILOSEC) 20 MG capsule Take 20 mg by mouth daily before breakfast.     . propranolol (INDERAL) 20 MG tablet TAKE  1 TABLET (20 MG TOTAL) BY MOUTH 2 (TWO) TIMES DAILY 180 tablet 1   No facility-administered medications prior to visit.    Allergies  Allergen Reactions  . Penicillins Rash    Review of Systems  Constitutional: Positive for fatigue. Negative for chills and fever.  HENT: Negative for congestion, ear pain and sore throat.   Respiratory: Positive for cough and shortness of breath.   Cardiovascular: Positive for chest pain.  Gastrointestinal: Negative for abdominal pain, constipation, nausea and vomiting.       Objective:    Physical Exam Vitals reviewed.  Constitutional:      General: He is not in acute distress. Cardiovascular:     Rate and Rhythm: Normal rate and regular rhythm.     Heart sounds: Normal heart sounds.  Pulmonary:     Comments: Decreased on left base. Not taking deep breaths.  Chest wall tender rt side. Chest:     Chest wall: Tenderness present.  Abdominal:     General: Abdomen is flat.     Palpations: Abdomen is soft.     Tenderness: There is no abdominal tenderness.  Neurological:     Mental Status: He is alert.     BP (!) 144/72   Pulse 72   Temp (!) 97.2 F (36.2 C)   Resp 16   Ht 6' (1.829 m)   Wt 219 lb 3.2 oz (99.4 kg)   SpO2 100% Comment: 86% with amb. Took several breaths and increased to 100%.  BMI 29.73 kg/m  Wt Readings from Last 3 Encounters:  09/02/20 219 lb 3.2 oz (99.4 kg)  08/19/20 220 lb (99.8 kg)  06/19/20 223 lb (101.2 kg)    Health Maintenance Due  Topic Date Due  . Hepatitis C Screening  Never done  . INFLUENZA VACCINE  07/07/2020    There are no preventive care reminders to display for this patient.   No results found for: TSH Lab Results  Component Value Date   WBC 5.0 05/15/2020   HGB 15.4 05/15/2020   HCT 47.3 05/15/2020   MCV 87 05/15/2020   PLT 147 (L) 05/15/2020   Lab Results  Component Value Date   NA 143 05/15/2020   K 4.1 05/15/2020   CO2 26 05/15/2020   GLUCOSE 103 (H) 05/15/2020   BUN 11  05/15/2020   CREATININE 0.94 05/15/2020   BILITOT 0.5 05/15/2020   ALKPHOS 107 05/15/2020   AST 23 05/15/2020   ALT 26 05/15/2020   PROT 6.7 05/15/2020   ALBUMIN 4.1 05/15/2020  CALCIUM 9.5 05/15/2020   ANIONGAP 6 01/02/2019   Lab Results  Component Value Date   CHOL 165 05/15/2020   Lab Results  Component Value Date   HDL 42 05/15/2020   Lab Results  Component Value Date   LDLCALC 101 (H) 05/15/2020   Lab Results  Component Value Date   TRIG 120 05/15/2020   Lab Results  Component Value Date   CHOLHDL 3.9 05/15/2020   No results found for: HGBA1C     Assessment & Plan:  1. Acute respiratory failure with hypoxia (HCC)  Oxygen ordered for home use and arranged, but then his ddimer came back > 4000. CTA came back very abnormal.  Results showed covid findings, left pleural effusion, pulmonary embolism, and left infiltrate (per radiology.)  No beds available at [facility].  Send via EMS to [facility].  2. Other chest pain  Order cxr/rib films abnormal. Concerning for pneumonia.  3. Other fatigue  Order tsh.  4. Dyspnea on exertion  Oxygen dropped to 85% with ambulation. Increased to 100 % at rest.   Recommend admission for PE and Covid dexamethasone.   5. Abdominal pain, unspecified abdominal location - POCT urinalysis dipstick - normal.   6. Pneumonia. Concerning.   Rocephin 1 gm given in office at 4:30 pm.   7. Pulmonary emboli  Needs admission. Sent by EMS for admission.  Orders Placed This Encounter  Procedures  . CT Angio Chest W/Cm &/Or Wo Cm  . CBC with Differential/Platelet  . Comprehensive metabolic panel  . TSH  . D-dimer, quantitative (not at [facility])  . POCT urinalysis dipstick     Follow-up: Return in about 4 weeks (around 09/30/2020) for shortness of breath.  An After Visit Summary was printed and given to the patient.  Blane Ohara [facility] [phone]

## 2020-09-02 NOTE — ED Triage Notes (Signed)
Patient reports to the ER for d-dimer being elevated. Patient reports he tested positive 14 days ago. Patient reports he has had SOB since getting COVID. Patient given 1gm rocephin in the office and xarelto.   168/84 BP 68 HR 98% on RA

## 2020-09-02 NOTE — Addendum Note (Signed)
Addended byBlane Ohara on: 09/02/2020 05:08 PM   Modules accepted: Orders

## 2020-09-02 NOTE — ED Notes (Signed)
Assumed care of patient at this time, nad noted, sr up x2, bed locked and low, call bell w/I reach.  Will continue to monitor. ° °

## 2020-09-02 NOTE — ED Provider Notes (Signed)
[facility]-EMERGENCY DEPT Provider Note   CSN: [id number] Arrival date & time: 09/02/20  1757     History Chief Complaint  Patient presents with  . Shortness of Breath    Jesse [name]. is a 79 y.o. male.  Patient was Covid vaccinated in January.  He and his wife are experiencing some shortness of breath fever and cough and he tested positive for Covid on September 13.  He has been isolating since then.  He went to his primary doctor's office today because of some left-sided pain with coughing and deep breathing that started 4 days ago.  His D-dimer was elevated and he had a CT chest that showed left lower lobe PE.  His oxygen saturations were reportedly 85% with exertion.  She was going to set him home with Eliquis and home oxygen but decided he probably should get admission due to his acute worsening.  Patient has no prior history of DVT or PE.  Not on anticoagulation.  No prior pulmonary issues.  The history is provided by the patient.  Shortness of Breath Severity:  Moderate Onset quality:  Gradual Timing:  Constant Progression:  Worsening Chronicity:  New Context: activity   Relieved by:  Nothing Worsened by:  Activity and coughing Ineffective treatments:  Rest Associated symptoms: chest pain and cough   Associated symptoms: no abdominal pain, no fever, no headaches, no hemoptysis, no rash, no sore throat, no sputum production and no vomiting        Past Medical History:  Diagnosis Date  . Arthritis   . Degenerative lumbar spinal stenosis   . Fatty liver   . GERD (gastroesophageal reflux disease)   . Headache   . History of kidney stones   . Hyperlipidemia   . Hypertension   . Mastodynia   . Migraine without aura and without status migrainosus, not intractable   . Neuritis or radiculitis due to rupture of lumbar intervertebral disc 07/15/2018  . Radiculopathy of thoracolumbar region 07/15/2018    Patient Active Problem List   Diagnosis  Date Noted  . Exposure to COVID-19 virus 08/19/2020  . Essential hypertension 05/19/2020  . Prediabetes 05/19/2020  . Gastroesophageal reflux disease without esophagitis 05/19/2020  . Chronic left shoulder pain 05/19/2020  . Impaired hearing 05/19/2020  . Neuritis or radiculitis due to rupture of lumbar intervertebral disc 07/15/2018  . Radiculopathy of thoracolumbar region 07/15/2018  . S/P lumbar fusion 07/13/2018  . Migraine without aura and without status migrainosus, not intractable 08/03/2017  . Headache 11/04/2016    Past Surgical History:  Procedure Laterality Date  . ANTERIOR LATERAL LUMBAR FUSION WITH PERCUTANEOUS SCREW 2 LEVEL N/A 07/13/2018   Procedure: ANTERIOR LATERAL LUMBAR FUSION L2-4;  Surgeon: Venita Lick, MD;  Location: [facility] OR;  Service: Orthopedics;  Laterality: N/A;  4 hrs  . CHOLECYSTECTOMY  1999  . GALLBLADDER SURGERY  2016  . NECK SURGERY  1995   Ruptured disc repair  . SHOULDER SURGERY Left 2005  . SHOULDER SURGERY Right 2016  . THROAT SURGERY     stretching       Family History  Problem Relation Age of Onset  . Hypertension Mother   . Cancer Father   . Diabetes Brother   . Migraines Neg Hx     Social History   Tobacco Use  . Smoking status: Former Smoker    Types: Cigarettes  . Smokeless tobacco: Never Used  . Tobacco comment: 12.2.19-quit at least 40 years ago  Vaping Use  . Vaping Use: Never used  Substance Use Topics  . Alcohol use: No  . Drug use: No    Home Medications Prior to Admission medications   Medication Sig Start Date End Date Taking? Authorizing Provider  Ascorbic Acid (VITAMIN C) 1000 MG tablet Take 1,000 mg by mouth daily.    [provider]  benzonatate (TESSALON) 200 MG capsule Take 1 capsule (200 mg total) by mouth 2 (two) times daily as needed for cough. 08/19/20   Corum, Minerva FesterLisa L, MD  omeprazole (PRILOSEC) 20 MG capsule Take 20 mg by mouth daily before breakfast.  09/21/16   [provider]    propranolol (INDERAL) 20 MG tablet TAKE 1 TABLET (20 MG TOTAL) BY MOUTH 2 (TWO) TIMES DAILY 08/06/20   Cox, Kirsten, MD  Zinc Sulfate (ZINC-220 PO) Take by mouth.    [provider]    Allergies    Penicillins  Review of Systems   Review of Systems  Constitutional: Negative for fever.  HENT: Negative for sore throat.   Eyes: Negative for visual disturbance.  Respiratory: Positive for cough and shortness of breath. Negative for hemoptysis and sputum production.   Cardiovascular: Positive for chest pain.  Gastrointestinal: Negative for abdominal pain and vomiting.  Genitourinary: Negative for dysuria.  Musculoskeletal: Positive for back pain.  Skin: Negative for rash.  Neurological: Negative for headaches.    Physical Exam Updated Vital Signs BP (!) 169/75 (BP Location: Left Arm)   Pulse 65   Temp 97.9 F (36.6 C) (Oral)   Resp 20   Ht 6' (1.829 m)   Wt 99.4 kg   SpO2 98%   BMI 29.73 kg/m   Physical Exam Vitals and nursing note reviewed.  Constitutional:      General: He is not in acute distress.    Appearance: Normal appearance. He is well-developed.  HENT:     Head: Normocephalic and atraumatic.  Eyes:     Conjunctiva/sclera: Conjunctivae normal.  Cardiovascular:     Rate and Rhythm: Normal rate and regular rhythm.     Heart sounds: No murmur heard.   Pulmonary:     Effort: Pulmonary effort is normal. No respiratory distress.     Breath sounds: Normal breath sounds.  Abdominal:     Palpations: Abdomen is soft.     Tenderness: There is no abdominal tenderness.  Musculoskeletal:        General: Normal range of motion.     Cervical back: Neck supple.     Right lower leg: No tenderness.     Left lower leg: No tenderness.  Skin:    General: Skin is warm and dry.     Capillary Refill: Capillary refill takes less than 2 seconds.  Neurological:     General: No focal deficit present.     Mental Status: He is alert.     ED Results / Procedures /  Treatments   Labs (all labs ordered are listed, but only abnormal results are displayed) Labs Reviewed  PROTIME-INR - Abnormal; Notable for the following components:      Result Value   Prothrombin Time 18.7 (*)    INR 1.6 (*)    All other components within normal limits  BASIC METABOLIC PANEL - Abnormal; Notable for the following components:   Glucose, Bld 108 (*)    All other components within normal limits  APTT - Abnormal; Notable for the following components:   aPTT 44 (*)    All other  components within normal limits  HEPARIN LEVEL (UNFRACTIONATED) - Abnormal; Notable for the following components:   Heparin Unfractionated >2.20 (*)    All other components within normal limits  CBC WITH DIFFERENTIAL/PLATELET - Abnormal; Notable for the following components:   Hemoglobin 12.5 (*)    HCT 36.7 (*)    All other components within normal limits  COMPREHENSIVE METABOLIC PANEL - Abnormal; Notable for the following components:   Glucose, Bld 109 (*)    Calcium 8.7 (*)    Albumin 3.0 (*)    ALT 57 (*)    All other components within normal limits  C-REACTIVE PROTEIN - Abnormal; Notable for the following components:   CRP 9.4 (*)    All other components within normal limits  D-DIMER, QUANTITATIVE (NOT AT [facility]) - Abnormal; Notable for the following components:   D-Dimer, Quant 3.36 (*)    All other components within normal limits  CBC WITH DIFFERENTIAL/PLATELET  BRAIN NATRIURETIC PEPTIDE  MAGNESIUM  CBC  HEPARIN LEVEL (UNFRACTIONATED)  APTT  TROPONIN I (HIGH SENSITIVITY)  TROPONIN I (HIGH SENSITIVITY)    EKG EKG Interpretation  Date/Time:  Monday September 02 2020 18:42:35 EDT Ventricular Rate:  67 PR Interval:    QRS Duration: 113 QT Interval:  445 QTC Calculation: 470 R Axis:   28 Text Interpretation: Sinus rhythm Atrial premature complex Borderline prolonged PR interval Incomplete left bundle branch block No significant change since prior 7/19 Confirmed by Meridee Score [phone]) on 09/02/2020 6:53:11 PM   Radiology No results found.  Procedures Procedures (including critical care time)  Medications Ordered in ED Medications  heparin bolus via infusion 3,000 Units (3,000 Units Intravenous Bolus from Bag 09/02/20 1921)    Followed by  heparin ADULT infusion 100 units/mL (25000 units/28mL sodium chloride 0.45%) (1,550 Units/hr Intravenous New Bag/Given 09/02/20 1921)  acetaminophen (TYLENOL) tablet 650 mg (has no administration in time range)  propranolol (INDERAL) tablet 20 mg (20 mg Oral Given 09/02/20 2221)  hydrALAZINE (APRESOLINE) injection 10 mg (has no administration in time range)  lactated ringers infusion ( Intravenous New Bag/Given 09/02/20 2302)  pantoprazole (PROTONIX) EC tablet 40 mg (has no administration in time range)  albuterol (VENTOLIN HFA) 108 (90 Base) MCG/ACT inhaler 2 puff (has no administration in time range)  dexamethasone (DECADRON) injection 6 mg (has no administration in time range)  guaiFENesin-dextromethorphan (ROBITUSSIN DM) 100-10 MG/5ML syrup 10 mL (has no administration in time range)  ascorbic acid (VITAMIN C) tablet 500 mg (has no administration in time range)  zinc sulfate capsule 220 mg (has no administration in time range)  polyethylene glycol (MIRALAX / GLYCOLAX) packet 17 g (has no administration in time range)  ondansetron (ZOFRAN) tablet 4 mg (has no administration in time range)    Or  ondansetron (ZOFRAN) injection 4 mg (has no administration in time range)  oxyCODONE-acetaminophen (PERCOCET/ROXICET) 5-325 MG per tablet 1 tablet (has no administration in time range)    Or  morphine 2 MG/ML injection 2 mg (has no administration in time range)    ED Course  I have reviewed the triage vital signs and the nursing notes.  Pertinent labs & imaging results that were available during my care of the patient were reviewed by me and considered in my medical decision making (see chart for details).    MDM  Rules/Calculators/A&P                         [name]. was evaluated  in Emergency Department on 09/02/2020 for the symptoms described in the history of present illness. He was evaluated in the context of the global COVID-19 pandemic, which necessitated consideration that the patient might be at risk for infection with the SARS-CoV-2 virus that causes COVID-19. Institutional protocols and algorithms that pertain to the evaluation of patients at risk for COVID-19 are in a state of rapid change based on information released by regulatory bodies including the CDC and federal and state organizations. These policies and algorithms were followed during the patient's care in the ED.  This patient complains of left-sided chest pain in the setting of recent Covid infection; this involves an extensive number of treatment Options and is a complaint that carries with it a high risk of complications and Morbidity. The differential includes bacterial superinfection, Covid infection, PE, vascular, pneumothorax, musculoskeletal  I ordered, reviewed and interpreted labs, which included CBC with normal white count, hemoglobin lower than baseline, chemistries with mildly elevated glucose and low calcium, elevated ALT likely reflecting with Covid infection, magnesium normal, troponin not significant elevated I ordered medication IV heparin  I independently    visualized and interpreted imaging which showed acute PE left lower lobe Additional history obtained from patient's primary care doctor Previous records obtained and reviewed in epic including recent Covid diagnosis and visit today with primary care I consulted Dr. Leafy [facility] hospitalist and discussed lab and imaging findings  Critical Interventions: none  After the interventions stated above, I reevaluated the patient and found patient to be oxygenating well and hemodynamically stable.  He is agreeable for admission for treatment of his pulmonary  embolism.    Final Clinical Impression(s) / ED Diagnoses Final diagnoses:  Acute pulmonary embolism without acute cor pulmonale, unspecified pulmonary embolism type [facility])    Rx / DC Orders ED Discharge Orders    None       Terrilee Files, MD 09/03/20 [phone]

## 2020-09-02 NOTE — Progress Notes (Signed)
ANTICOAGULATION CONSULT NOTE - Initial Consult  Pharmacy Consult for IV heparin Indication: pulmonary embolus  Allergies  Allergen Reactions  . Penicillins Rash    Patient Measurements: Height: 6' (182.9 cm) Weight: 99.4 kg (219 lb 3.2 oz) IBW/kg (Calculated) : 77.6 Heparin Dosing Weight: 97.7 kg  Vital Signs: Temp: 97.9 F (36.6 C) (09/27 1808) Temp Source: Oral (09/27 1808) BP: 169/75 (09/27 1808) Pulse Rate: 65 (09/27 1808)  Labs: No results for input(s): HGB, HCT, PLT, APTT, LABPROT, INR, HEPARINUNFRC, HEPRLOWMOCWT, CREATININE, CKTOTAL, CKMB, TROPONINIHS in the last 72 hours.  CrCl cannot be calculated (Patient's most recent lab result is older than the maximum 21 days allowed.).   Medical History: Past Medical History:  Diagnosis Date  . Arthritis   . Degenerative lumbar spinal stenosis   . Fatty liver   . GERD (gastroesophageal reflux disease)   . Headache   . History of kidney stones   . Hyperlipidemia   . Hypertension   . Mastodynia   . Migraine without aura and without status migrainosus, not intractable   . Neuritis or radiculitis due to rupture of lumbar intervertebral disc 07/15/2018  . Radiculopathy of thoracolumbar region 07/15/2018    Assessment: 79 y/o COVID+ male sent from PCP office due to elevated d-dimer, CT chest with LLL PE. Pharmacy consulted for IV heparin dosing. Patient not on anticoagulants PTA. Baseline labs pending.     Goal of Therapy:  Heparin level 0.3-0.7 units/ml Monitor platelets by anticoagulation protocol: Yes   Plan:   Heparin 3000 units IV bolus x 1, then start heparin infusion at 1550 units/hr  Heparin level 8 hours after initiation  Daily CBC, heparin level  Monitor closely for s/sx of bleeding   Greer Pickerel, PharmD, BCPS Clinical Pharmacist  09/02/2020,6:44 PM

## 2020-09-03 ENCOUNTER — Observation Stay (HOSPITAL_BASED_OUTPATIENT_CLINIC_OR_DEPARTMENT_OTHER): Payer: PPO

## 2020-09-03 DIAGNOSIS — I350 Nonrheumatic aortic (valve) stenosis: Secondary | ICD-10-CM

## 2020-09-03 DIAGNOSIS — I2699 Other pulmonary embolism without acute cor pulmonale: Secondary | ICD-10-CM | POA: Diagnosis not present

## 2020-09-03 LAB — CBC WITH DIFFERENTIAL/PLATELET
Abs Immature Granulocytes: 0.03 10*3/uL (ref 0.00–0.07)
Basophils Absolute: 0 10*3/uL (ref 0.0–0.1)
Basophils Relative: 1 %
Eosinophils Absolute: 0.1 10*3/uL (ref 0.0–0.5)
Eosinophils Relative: 2 %
HCT: 36.7 % — ABNORMAL LOW (ref 39.0–52.0)
Hemoglobin: 12.5 g/dL — ABNORMAL LOW (ref 13.0–17.0)
Immature Granulocytes: 1 %
Lymphocytes Relative: 16 %
Lymphs Abs: 1 10*3/uL (ref 0.7–4.0)
MCH: 28.8 pg (ref 26.0–34.0)
MCHC: 34.1 g/dL (ref 30.0–36.0)
MCV: 84.6 fL (ref 80.0–100.0)
Monocytes Absolute: 0.6 10*3/uL (ref 0.1–1.0)
Monocytes Relative: 10 %
Neutro Abs: 4.4 10*3/uL (ref 1.7–7.7)
Neutrophils Relative %: 70 %
Platelets: 215 10*3/uL (ref 150–400)
RBC: 4.34 MIL/uL (ref 4.22–5.81)
RDW: 12.8 % (ref 11.5–15.5)
WBC: 6.2 10*3/uL (ref 4.0–10.5)
nRBC: 0 % (ref 0.0–0.2)

## 2020-09-03 LAB — TROPONIN I (HIGH SENSITIVITY): Troponin I (High Sensitivity): 10 ng/L (ref <18)

## 2020-09-03 LAB — ECHOCARDIOGRAM LIMITED
AR max vel: 1.36 cm2
AV Area VTI: 1.26 cm2
AV Area mean vel: 1.44 cm2
AV Mean grad: 14 mmHg
AV Peak grad: 25.2 mmHg
Ao pk vel: 2.51 m/s
Area-P 1/2: 4.49 cm2
Height: 72 in
S' Lateral: 2.8 cm
Weight: 3507.18 oz

## 2020-09-03 LAB — COMPREHENSIVE METABOLIC PANEL
ALT: 57 U/L — ABNORMAL HIGH (ref 0–44)
AST: 33 U/L (ref 15–41)
Albumin: 3 g/dL — ABNORMAL LOW (ref 3.5–5.0)
Alkaline Phosphatase: 70 U/L (ref 38–126)
Anion gap: 7 (ref 5–15)
BUN: 10 mg/dL (ref 8–23)
CO2: 27 mmol/L (ref 22–32)
Calcium: 8.7 mg/dL — ABNORMAL LOW (ref 8.9–10.3)
Chloride: 105 mmol/L (ref 98–111)
Creatinine, Ser: 0.75 mg/dL (ref 0.61–1.24)
GFR calc Af Amer: >60 mL/min (ref >60)
GFR calc non Af Amer: >60 mL/min (ref >60)
Glucose, Bld: 109 mg/dL — ABNORMAL HIGH (ref 70–99)
Potassium: 3.6 mmol/L (ref 3.5–5.1)
Sodium: 139 mmol/L (ref 135–145)
Total Bilirubin: 0.5 mg/dL (ref 0.3–1.2)
Total Protein: 6.5 g/dL (ref 6.5–8.1)

## 2020-09-03 LAB — C-REACTIVE PROTEIN: CRP: 9.4 mg/dL — ABNORMAL HIGH (ref <1.0)

## 2020-09-03 LAB — APTT: aPTT: 129 seconds — ABNORMAL HIGH (ref 24–36)

## 2020-09-03 LAB — MAGNESIUM: Magnesium: 2 mg/dL (ref 1.7–2.4)

## 2020-09-03 LAB — HEPARIN LEVEL (UNFRACTIONATED)
Heparin Unfractionated: >2.2 IU/mL — ABNORMAL HIGH (ref 0.30–0.70)
Heparin Unfractionated: 1.78 IU/mL — ABNORMAL HIGH (ref 0.30–0.70)

## 2020-09-03 LAB — D-DIMER, QUANTITATIVE: D-Dimer, Quant: 3.36 ug/mL-FEU — ABNORMAL HIGH (ref 0.00–0.50)

## 2020-09-03 MED ORDER — POLYETHYLENE GLYCOL 3350 17 G PO PACK: 17.0000 g | PACK | Freq: Every day | ORAL | 0 refills | Status: DC | PRN

## 2020-09-03 MED ORDER — DEXAMETHASONE 6 MG PO TABS: 6.0000 mg | ORAL_TABLET | Freq: Every day | ORAL | 0 refills | Status: AC

## 2020-09-03 MED ORDER — DM-GUAIFENESIN ER 30-600 MG PO TB12: 1.0000 | ORAL_TABLET | Freq: Two times a day (BID) | ORAL | 0 refills | Status: DC | PRN

## 2020-09-03 MED ORDER — APIXABAN (ELIQUIS) VTE STARTER PACK (10MG AND 5MG): ORAL_TABLET | ORAL | 0 refills | Status: DC

## 2020-09-03 MED ORDER — APIXABAN 5 MG PO TABS
10.0000 mg | ORAL_TABLET | Freq: Two times a day (BID) | ORAL | Status: DC
  Administered 2020-09-03: 10 mg via ORAL
  Filled 2020-09-03: qty 2

## 2020-09-03 MED ORDER — HEPARIN (PORCINE) 25000 UT/250ML-% IV SOLN
1200.0000 [IU]/h | INTRAVENOUS | Status: DC
  Administered 2020-09-03: 1200 [IU]/h via INTRAVENOUS

## 2020-09-03 MED ORDER — TRAMADOL HCL 50 MG PO TABS: 50.0000 mg | ORAL_TABLET | Freq: Four times a day (QID) | ORAL | 0 refills | Status: AC | PRN

## 2020-09-03 MED ORDER — ACETAMINOPHEN 325 MG PO TABS: 650.0000 mg | ORAL_TABLET | ORAL | Status: DC | PRN

## 2020-09-03 MED ORDER — DM-GUAIFENESIN ER 30-600 MG PO TB12: 1.0000 | ORAL_TABLET | Freq: Two times a day (BID) | ORAL | Status: DC | PRN

## 2020-09-03 MED ORDER — APIXABAN 5 MG PO TABS: 5.0000 mg | ORAL_TABLET | Freq: Two times a day (BID) | ORAL | Status: DC

## 2020-09-03 NOTE — Progress Notes (Addendum)
ANTICOAGULATION CONSULT NOTE - Follow Up Consult  Pharmacy Consult for Heparin Indication: pulmonary embolus  Allergies  Allergen Reactions  . Penicillins Rash    Patient Measurements: Height: 6' (182.9 cm) Weight: 99.4 kg (219 lb 3.2 oz) IBW/kg (Calculated) : 77.6 Heparin Dosing Weight: 98kg  Vital Signs: BP: 155/90 (09/28 0655) Pulse Rate: 73 (09/28 0655)  Labs: Recent Labs    09/02/20 1850 09/03/20 0105 09/03/20 0328 09/03/20 0329  HGB 14.0  --   --  12.5*  HCT 41.6  --   --  36.7*  PLT 249  --   --  215  APTT 44*  --   --   --   LABPROT 18.7*  --   --   --   INR 1.6*  --   --   --   HEPARINUNFRC  --   --  >2.20*  --   CREATININE 0.82  --   --  0.75  TROPONINIHS 6 10  --   --     Estimated Creatinine Clearance: 91.4 mL/min (by C-G formula based on SCr of 0.75 mg/dL).   Medications:  Infusions:  . heparin 1,550 Units/hr (09/02/20 1921)    Assessment: 79 yoM COVID+ on 9/13 sent from PCP office on 9/27 due to elevated d-dimer, CT chest with LLL PE. Pharmacy consulted for IV heparin dosing. Patient not on anticoagulants PTA, baseline aPTT 44, INR 1.6  Heparin level > 2.2 overnight.  RN contacted at 5:29 AM by overnight pharmacist with no response. Questionable results, Ordered stat re-draw Heparin level and aPTT at 07:15 to confirm.  Unable to reach ED RN unavailable by phone or secure chat until 8:20.  She states she has been unable to draw labs yet due to another unstable intubated patient.  She will draw them as soon as she is able. CBC:  Hgb decreased to 12.5, Plt 215 No bleeding or complications documented  Goal of Therapy:  Heparin level 0.3-0.7 units/ml Monitor platelets by anticoagulation protocol: Yes   Plan:  STAT redraw heparin level and aPTT to confirm levels Heparin IV infusion at 1550 units/hr Heparin level 8 hours after starting Daily heparin level and CBC  Lynann Beaver PharmD, BCPS Clinical Pharmacist [facility] main pharmacy  [phone] 09/03/2020 7:09 AM     Addendum: Repeat HL 1.78 remains elevated, aPTT elevated at 129 RN is off the floor transporting a patient and unable to answer the phone. Plan: Hold Heparin x1 hour Resume at reduce rate, heparin IV infusion at 1200 units/hr Heparin level 8 hours after restarting Daily heparin level and CBC  Lynann Beaver PharmD, BCPS Clinical Pharmacist [facility] pharmacy [phone] 09/03/2020 9:17 AM

## 2020-09-03 NOTE — Progress Notes (Signed)
ANTICOAGULATION CONSULT NOTE  Pharmacy Consult for Heparin >> apixaban Indication: pulmonary embolus  Allergies  Allergen Reactions  . Penicillins Rash    Patient Measurements: Height: 6' (182.9 cm) Weight: 99.4 kg (219 lb 3.2 oz) IBW/kg (Calculated) : 77.6 Heparin Dosing Weight: 98kg  Vital Signs: BP: 148/75 (09/28 1700) Pulse Rate: 74 (09/28 1700)  Labs: Recent Labs    09/02/20 1850 09/03/20 0105 09/03/20 0328 09/03/20 0329 09/03/20 0822  HGB 14.0  --   --  12.5*  --   HCT 41.6  --   --  36.7*  --   PLT 249  --   --  215  --   APTT 44*  --   --   --  129*  LABPROT 18.7*  --   --   --   --   INR 1.6*  --   --   --   --   HEPARINUNFRC  --   --  >2.20*  --  1.78*  CREATININE 0.82  --   --  0.75  --   TROPONINIHS 6 10  --   --   --     Estimated Creatinine Clearance: 91.4 mL/min (by C-G formula based on SCr of 0.75 mg/dL).   Medications:  Infusions:  . heparin 1,200 Units/hr (09/03/20 1540)    Assessment: 79 yoM COVID+ on 9/13 sent from PCP office on 9/27 due to elevated d-dimer, CT chest with LLL PE. Pharmacy consulted for IV heparin dosing. Patient not on anticoagulants PTA, baseline aPTT 44, INR 1.6  Heparin level > 2.2 overnight.  RN contacted at 5:29 AM by overnight pharmacist with no response. Questionable results, Ordered stat re-draw Heparin level and aPTT at 07:15 to confirm.  Unable to reach ED RN unavailable by phone or secure chat until 8:20.  She states she has been unable to draw labs yet due to another unstable intubated patient.  She will draw them as soon as she is able. CBC:  Hgb decreased to 12.5, Plt 215 No bleeding or complications documented  Goal of Therapy:  Heparin level 0.3-0.7 units/ml Monitor platelets by anticoagulation protocol: Yes   Plan:  STAT redraw heparin level and aPTT to confirm levels Heparin IV infusion at 1550 units/hr Heparin level 8 hours after starting Daily heparin level and CBC  Lynann Beaver PharmD,  BCPS Clinical Pharmacist [facility] [phone] 09/03/2020 5:50 PM     Addendum: Repeat HL 1.78 remains elevated, aPTT elevated at 129 RN is off the floor transporting a patient and unable to answer the phone. Plan: Hold Heparin x1 hour Resume at reduce rate, heparin IV infusion at 1200 units/hr Heparin level 8 hours after restarting Daily heparin level and CBC  Lynann Beaver PharmD, BCPS Clinical Pharmacist [facility] [phone] 09/03/2020 5:50 PM  Addendum:  Transition patient to apixaban 10 mg bid x 7 days followed by 5 mg bid thereafter. Will f/u renal function, CBC, and s/s of bleeding remotely.   Thank you for the consult.   Luisa Hart, PharmD, BCPS [phone] 09/03/20 5:52 PM

## 2020-09-03 NOTE — Discharge Instructions (Signed)
Pulmonary Embolism  A pulmonary embolism (PE) is a sudden blockage or decrease of blood flow in one or both lungs. Most blockages come from a blood clot that forms in the vein of a lower leg, thigh, or arm (deep vein thrombosis, DVT) and travels to the lungs. A clot is blood that has thickened into a gel or solid. PE is a dangerous and life-threatening condition that needs to be treated right away. What are the causes? This condition is usually caused by a blood clot that forms in a vein and moves to the lungs. In rare cases, it may be caused by air, fat, part of a tumor, or other tissue that moves through the veins and into the lungs. What increases the risk? The following factors may make you more likely to develop this condition:  Experiencing a traumatic injury, such as breaking a hip or leg.  Having: ? A spinal cord injury. ? Orthopedic surgery, especially hip or knee replacement. ? Any major surgery. ? A stroke. ? DVT. ? Blood clots or blood clotting disease. ? Long-term (chronic) lung or heart disease. ? Cancer treated with chemotherapy. ? A central venous catheter.  Taking medicines that contain estrogen. These include birth control pills and hormone replacement therapy.  Being: ? Pregnant. ? In the period of time after your baby is delivered (postpartum). ? Older than age 60. ? Overweight. ? A smoker, especially if you have other risks. What are the signs or symptoms? Symptoms of this condition usually start suddenly and include:  Shortness of breath during activity or at rest.  Coughing, coughing up blood, or coughing up blood-tinged mucus.  Chest pain that is often worse with deep breaths.  Rapid or irregular heartbeat.  Feeling light-headed or dizzy.  Fainting.  Feeling anxious.  Fever.  Sweating.  Pain and swelling in a leg. This is a symptom of DVT, which can lead to PE. How is this diagnosed? This condition may be diagnosed based on:  Your medical  history.  A physical exam.  Blood tests.  CT pulmonary angiogram. This test checks blood flow in and around your lungs.  Ventilation-perfusion scan, also called a lung VQ scan. This test measures air flow and blood flow to the lungs.  An ultrasound of the legs. How is this treated? Treatment for this condition depends on many factors, such as the cause of your PE, your risk for bleeding or developing more clots, and other medical conditions you have. Treatment aims to remove, dissolve, or stop blood clots from forming or growing larger. Treatment may include:  Medicines, such as: ? Blood thinning medicines (anticoagulants) to stop clots from forming. ? Medicines that dissolve clots (thrombolytics).  Procedures, such as: ? Using a flexible tube to remove a blood clot (embolectomy) or to deliver medicine to destroy it (catheter-directed thrombolysis). ? Inserting a filter into a large vein that carries blood to the heart (inferior vena cava). This filter (vena cava filter) catches blood clots before they reach the lungs. ? Surgery to remove the clot (surgical embolectomy). This is rare. You may need a combination of immediate, long-term (up to 3 months after diagnosis), and extended (more than 3 months after diagnosis) treatments. Your treatment may continue for several months (maintenance therapy). You and your health care provider will work together to choose the treatment program that is best for you. Follow these instructions at home: Medicines  Take over-the-counter and prescription medicines only as told by your health care provider.  If you   are taking an anticoagulant medicine: ? Take the medicine every day at the same time each day. ? Understand what foods and drugs interact with your medicine. ? Understand the side effects of this medicine, including excessive bruising or bleeding. Ask your health care provider or pharmacist about other side effects. General  instructions  Wear a medical alert bracelet or carry a medical alert card that says you have had a PE and lists what medicines you take.  Ask your health care provider when you may return to your normal activities. Avoid sitting or lying for a long time without moving.  Maintain a healthy weight. Ask your health care provider what weight is healthy for you.  Do not use any products that contain nicotine or tobacco, such as cigarettes, e-cigarettes, and chewing tobacco. If you need help quitting, ask your health care provider.  Talk with your health care provider about any travel plans. It is important to make sure that you are still able to take your medicine while on trips.  Keep all follow-up visits as told by your health care provider. This is important. Contact a health care provider if:  You missed a dose of your blood thinner medicine. Get help right away if:  You have: ? New or increased pain, swelling, warmth, or redness in an arm or leg. ? Numbness or tingling in an arm or leg. ? Shortness of breath during activity or at rest. ? A fever. ? Chest pain. ? A rapid or irregular heartbeat. ? A severe headache. ? Vision changes. ? A serious fall or accident, or you hit your head. ? Stomach (abdominal) pain. ? Blood in your vomit, stool, or urine. ? A cut that will not stop bleeding.  You cough up blood.  You feel light-headed or dizzy.  You cannot move your arms or legs.  You are confused or have memory loss. These symptoms may represent a serious problem that is an emergency. Do not wait to see if the symptoms will go away. Get medical help right away. Call your local emergency services (911 in the [place] Do not drive yourself to the hospital. Summary  A pulmonary embolism (PE) is a sudden blockage or decrease of blood flow in one or both lungs. PE is a dangerous and life-threatening condition that needs to be treated right away.  Treatments for this condition usually  include medicines to thin your blood (anticoagulants) or medicines to break apart blood clots (thrombolytics).  If you are given blood thinners, it is important to take the medicine every day at the same time each day.  Understand what foods and drugs interact with any medicines that you are taking.  If you have signs of PE or DVT, call your local emergency services (911 in the [place] This information is not intended to replace advice given to you by your health care provider. Make sure you discuss any questions you have with your health care provider. Document Revised: 08/31/2018 Document Reviewed: 08/31/2018 Elsevier Patient Education  2020 [employer].  Information on my medicine - ELIQUIS (apixaban)  This medication education was reviewed with me or my healthcare representative as part of my discharge preparation.  The pharmacist that spoke with me during my hospital stay was:  Valentina Gu [employer] [employer] [facility]  Why was Eliquis prescribed for you? Eliquis was prescribed to treat blood clots that may have been found in the veins of your legs (deep vein thrombosis) or in your lungs (pulmonary embolism) and to reduce the  risk of them occurring again.  What do You need to know about Eliquis ? The starting dose is 10 mg (two 5 mg tablets) taken TWICE daily for the FIRST SEVEN (7) DAYS, then on (enter date)  10/6  the dose is reduced to ONE 5 mg tablet taken TWICE daily.  Eliquis may be taken with or without food.   Try to take the dose about the same time in the morning and in the evening. If you have difficulty swallowing the tablet whole please discuss with your pharmacist how to take the medication safely.  Take Eliquis exactly as prescribed and DO NOT stop taking Eliquis without talking to the doctor who prescribed the medication.  Stopping may increase your risk of developing a new blood clot.  Refill your prescription before you run out.  After discharge, you should have regular check-up  appointments with your healthcare provider that is prescribing your Eliquis.    What do you do if you miss a dose? If a dose of ELIQUIS is not taken at the scheduled time, take it as soon as possible on the same day and twice-daily administration should be resumed. The dose should not be doubled to make up for a missed dose.  Important Safety Information A possible side effect of Eliquis is bleeding. You should call your healthcare provider right away if you experience any of the following: ? Bleeding from an injury or your nose that does not stop. ? Unusual colored urine (red or dark brown) or unusual colored stools (red or black). ? Unusual bruising for unknown reasons. ? A serious fall or if you hit your head (even if there is no bleeding).  Some medicines may interact with Eliquis and might increase your risk of bleeding or clotting while on Eliquis. To help avoid this, consult your healthcare provider or pharmacist prior to using any new prescription or non-prescription medications, including herbals, vitamins, non-steroidal anti-inflammatory drugs (NSAIDs) and supplements.  This website has more information on Eliquis (apixaban): http://www.eliquis.com/eliquis/home

## 2020-09-03 NOTE — Progress Notes (Signed)
  Echocardiogram 2D Echocardiogram limited with definity has been performed.  Leta Jungling M 09/03/2020, 12:12 PM

## 2020-09-03 NOTE — ED Notes (Signed)
ECHO at bedside.

## 2020-09-03 NOTE — ED Notes (Signed)
Meal tray given 

## 2020-09-18 NOTE — Discharge Summary (Signed)
[facility] Discharge Summary   Patient: Jesse [name]. CZY:[id number]  PCP: Blane Ohara, MD  Date of admission: 09/02/2020   Date of discharge: 09/03/2020      Discharge Diagnoses:   Principal Problem:   Acute pulmonary embolism without acute cor pulmonale (HCC) Active Problems:   Gastroesophageal reflux disease without esophagitis   Hypertensive urgency   Chest pain   COVID-19 virus infection   Admitted From: Home Disposition:  Home   Recommendations for Outpatient Follow-up:  1. PCP: Follow-up with PCP in 1 to 2 weeks 2. Follow up LABS/TEST: None   Diet recommendation: Cardiac diet  Activity: The patient is advised to gradually reintroduce usual activities, as tolerated  Discharge Condition: stable  Code Status: Full code   History of present illness: As per the H and P dictated on admission, "79 year old male with past medical history of gastroesophageal reflux disease, hypertension, migraine headaches and low back pain who presents to [facility] emergency department after being sent by his primary care provider due to concern for acute pulmonary embolism.  Patient states that on approximately September 12 he began to develop shortness of breath and dry nonproductive cough.  This was associated with generalized weakness, malaise and poor appetite.  Patient also complains of associated fevers.  Patient was diagnosed with COVID-19 on September 13.  He was sent home for conservative management.  Despite resting at home, patient's shortness of breath and cough continued to persist.  Patient continued to experience generalized weakness and poor appetite.  Patient states that his fevers did subside though after several days.  Approximately 2 days ago, patient awoke from sleep with intense left-sided chest pain.  Patient describes his pain as severe in intensity, sharp in quality, located in the left lateral chest and nonradiating.  Pain seems to be worse  with movement and occasionally worse with cough or deep inspiration.  Patient's pain has waxed and waned for the following 48 hours.  Patient eventually presented to see his primary care provider who performed a thorough outpatient assessment which eventually led to a CT angiogram of the chest that identified a left lower lobe acute pulmonary embolism.  While the patient was being evaluated at his primary care provider's office he was noted to be hypoxic with minimal exertion.  The patient was then sent to [facility] emergency department for evaluation.  Upon evaluation in the emergency department after the results of the CT pulmonary angiogram were confirmed, patient was initiated on intravenous heparin infusion.  The hospitalist group was then called to assess the patient for admission to the hospital."  Hospital Course:  Summary of his active problems in the hospital is as following.   Acute pulmonary embolism without cor pulmonale. Presents with complaints of left chest pain. Was hypoxic at home. Went to see PCPs office D-dimer was elevated.  CT of the chest performed presence of left lower lobe pulmonary embolism. Initially started on heparin drip. Echocardiogram performed shows preserved EF without any right ventricular dysfunction or strain. No evidence of bilateral lower extremity edema Had extensive discussion with the patient regarding anticoagulation options. Patient reported transition to Eliquis on discharge. Patient was hemodynamically stable without any evidence of hypoxia, tachycardia or hypotension. No prior history of bleeding reported as well. Recommend outpatient follow-up with PCP regarding duration of the anticoagulation although I suspect patient will not require anticoagulation more than 3 months.  Acute COVID-19 pneumonia. Diagnosed outpatient on 08/19/2020. Not hypoxic. Started on Decadron. We will  complete the course outpatient.  Accelerated  hypertension. Blood pressure mildly elevated. Likely response to pain and stress. Patient is on Inderal at home which I will continue. Follow-up with PCP to discuss further treatment option should the blood pressure remains elevated outpatient.  Pain control  - [employer] [place] Controlled Substance Reporting System database was reviewed. - 5 day supply was provided. - Patient was instructed, not to drive, operate heavy machinery, perform activities at heights, swimming or participation in water activities or provide baby sitting services while on Pain, Sleep and Anxiety Medications; until his outpatient Physician has advised to do so again.  - Also recommended to not to take more than prescribed Pain, Sleep and Anxiety Medications.  Patient was ambulatory without any assistance. On the day of the discharge the patient's vitals were stable, and no other acute medical condition were reported by patient. The patient was felt safe to be discharge at Home with no therapy needed on discharge.  Consultants: none Procedures: Echocardiogram   Discharge Exam: General: Appear in no distress, no Rash; Oral Mucosa Clear, moist. no Abnormal Neck Mass Or lumps, Conjunctiva normal  Cardiovascular: S1 and S2 Present, no Murmur Respiratory: good respiratory effort, Bilateral Air entry present and Occasional Crackles, no wheezes Abdomen: Bowel Sound present, Soft and no tenderness Extremities: no Pedal edema Neurology: alert and oriented to time, place, and person affect appropriate. no new focal deficit  Filed Weights   09/02/20 1813  Weight: 99.4 kg   Vitals:   09/03/20 1800 09/03/20 1839  BP: (!) 170/90 (!) 171/96  Pulse: 69 72  Resp: (!) 22 (!) 22  Temp:    SpO2: 95% 95%    DISCHARGE MEDICATION: Allergies as of 09/03/2020      Reactions   Penicillins Rash      Medication List    TAKE these medications   acetaminophen 325 MG tablet Commonly known as: TYLENOL Take 2 tablets (650 mg  total) by mouth every 4 (four) hours as needed for mild pain, fever or headache.   Apixaban Starter Pack (10mg  and 5mg ) Commonly known as: ELIQUIS STARTER PACK Take as directed on package: start with two-5mg  tablets twice daily for 7 days. On day 8, switch to one-5mg  tablet twice daily.   benzonatate 200 MG capsule Commonly known as: TESSALON Take 1 capsule (200 mg total) by mouth 2 (two) times daily as needed for cough.   dextromethorphan-guaiFENesin 30-600 MG 12hr tablet Commonly known as: MUCINEX DM Take 1 tablet by mouth 2 (two) times daily as needed for cough.   omeprazole 20 MG capsule Commonly known as: PRILOSEC Take 20 mg by mouth daily before breakfast.   polyethylene glycol 17 g packet Commonly known as: MIRALAX / GLYCOLAX Take 17 g by mouth daily as needed for mild constipation.   propranolol 20 MG tablet Commonly known as: INDERAL TAKE 1 TABLET (20 MG TOTAL) BY MOUTH 2 (TWO) TIMES DAILY What changed: See the new instructions.   vitamin C 1000 MG tablet Take 1,000 mg by mouth daily.   ZINC-220 PO Take 1 capsule by mouth daily.     ASK your doctor about these medications   dexamethasone 6 MG tablet Commonly known as: Decadron Take 1 tablet (6 mg total) by mouth daily for 9 days. Ask about: Should I take this medication?   traMADol 50 MG tablet Commonly known as: Ultram Take 1 tablet (50 mg total) by mouth every 6 (six) hours as needed for up to 5 days for moderate pain  or severe pain. Ask about: Should I take this medication?      Allergies  Allergen Reactions  . Penicillins Rash   Discharge Instructions    Diet - low sodium heart healthy   Complete by: As directed    Increase activity slowly   Complete by: As directed       The results of significant diagnostics from this hospitalization (including imaging, microbiology, ancillary and laboratory) are listed below for reference.    Significant Diagnostic Studies: ECHOCARDIOGRAM LIMITED  Result  Date: 09/03/2020    ECHOCARDIOGRAM LIMITED REPORT   Patient Name:   Jesse [name]. Date of Exam: 09/03/2020 Medical Rec #:  [id number]           Height:       72.0 in Accession #:    [id number]          Weight:       219.2 lb Date of Birth:  09-10-1941           BSA:          2.215 m Patient Age:    79 years            BP:           157/74 mmHg Patient Gender: M                   HR:           76 bpm. Exam Location:  Inpatient Procedure: Limited Echo, Color Doppler, Cardiac Doppler and Intracardiac            Opacification Agent Indications:    Acute pulmonary embolism (HCC) [[id number]]  History:        Patient has prior history of Echocardiogram examinations, most                 recent 06/21/2020. Signs/Symptoms:Shortness of Breath; Risk                 Factors:Hypertension and Dyslipidemia. Diagnosed with COVID 19                 on 9/13.  Sonographer:    Leta Jungling RDCS Referring Phys: [id number] Deno Lunger [facility] IMPRESSIONS  1. Left ventricular ejection fraction, by estimation, is 60 to 65%. The left ventricle has normal function. The left ventricle has no regional wall motion abnormalities. There is mild left ventricular hypertrophy. Left ventricular diastolic parameters are consistent with Grade I diastolic dysfunction (impaired relaxation).  2. Left atrial size was mildly dilated.  3. The aortic valve is calcified. Aortic valve regurgitation is not visualized. Mild to moderate aortic valve stenosis. Aortic valve area, by VTI measures 1.26 cm. Aortic valve mean gradient measures 14.0 mmHg. Aortic valve Vmax measures 2.51 m/s. Comparison(s): Prior images unable to be directly viewed, comparison made by report only. Changes from prior study are noted. 06/21/20: Report from [place] - LVEF 55-60%, grade 1 DD, mild AS, gradients of 10/16 mmHg. FINDINGS  Left Ventricle: Left ventricular ejection fraction, by estimation, is 60 to 65%. The left ventricle has normal function. The left ventricle has no regional wall  motion abnormalities. Definity contrast agent was given IV to delineate the left ventricular  endocardial borders. There is mild left ventricular hypertrophy. Left ventricular diastolic parameters are consistent with Grade I diastolic dysfunction (impaired relaxation). Indeterminate filling pressures. Left Atrium: Left atrial size was mildly dilated. Right Atrium: Right atrial size was normal in size. Aortic Valve: The aortic valve is  calcified. Aortic valve regurgitation is not visualized. Mild to moderate aortic stenosis is present. Aortic valve mean gradient measures 14.0 mmHg. Aortic valve peak gradient measures 25.2 mmHg. Aortic valve area, by VTI measures 1.26 cm. Venous: The inferior vena cava was not well visualized. IAS/Shunts: No atrial level shunt detected by color flow Doppler. LEFT VENTRICLE PLAX 2D LVIDd:         4.40 cm      Diastology LVIDs:         2.80 cm      LV e' medial:    5.52 cm/s LV PW:         1.20 cm      LV E/e' medial:  8.1 LV IVS:        1.20 cm      LV e' lateral:   7.36 cm/s LVOT diam:     2.30 cm      LV E/e' lateral: 6.1 LV SV:         63 LV SV Index:   29 LVOT Area:     4.15 cm  LV Volumes (MOD) LV vol d, MOD A4C: 141.0 ml LV SV MOD A4C:     141.0 ml LEFT ATRIUM         Index LA diam:    3.50 cm 1.58 cm/m  AORTIC VALVE AV Area (Vmax):    1.36 cm AV Area (Vmean):   1.44 cm AV Area (VTI):     1.26 cm AV Vmax:           251.00 cm/s AV Vmean:          171.000 cm/s AV VTI:            0.500 m AV Peak Grad:      25.2 mmHg AV Mean Grad:      14.0 mmHg LVOT Vmax:         82.20 cm/s LVOT Vmean:        59.300 cm/s LVOT VTI:          0.152 m LVOT/AV VTI ratio: 0.30  AORTA Ao Root diam: 3.20 cm MITRAL VALVE               TRICUSPID VALVE MV Area (PHT): 4.49 cm    TR Peak grad:   25.0 mmHg MV Decel Time: 169 msec    TR Vmax:        250.00 cm/s MV E velocity: 44.70 cm/s MV A velocity: 58.50 cm/s  SHUNTS MV E/A ratio:  0.76        Systemic VTI:  0.15 m                            Systemic  Diam: 2.30 cm Zoila Shutter MD Electronically signed by Zoila Shutter MD Signature Date/Time: 09/03/2020/12:25:19 PM    Final     Microbiology: No results found for this or any previous visit (from the past 240 hour(s)).   Labs: CBC: No results for input(s): WBC, NEUTROABS, HGB, HCT, MCV, PLT in the last 168 hours. Basic Metabolic Panel: No results for input(s): NA, K, CL, CO2, GLUCOSE, BUN, CREATININE, CALCIUM, MG, PHOS in the last 168 hours. Liver Function Tests: No results for input(s): AST, ALT, ALKPHOS, BILITOT, PROT, ALBUMIN in the last 168 hours. CBG: No results for input(s): GLUCAP in the last 168 hours.  Time spent: 35 minutes  Signed:  Lynden Oxford  [facility] 09/03/2020 2:16 PM

## 2020-09-30 MED ORDER — PERFLUTREN LIPID MICROSPHERE
1.0000 mL | INTRAVENOUS | Status: AC | PRN
  Administered 2020-09-03: 2 mL via INTRAVENOUS

## 2020-10-01 ENCOUNTER — Encounter: Payer: Self-pay | Admitting: Family Medicine

## 2020-10-01 ENCOUNTER — Ambulatory Visit (INDEPENDENT_AMBULATORY_CARE_PROVIDER_SITE_OTHER): Payer: PPO | Admitting: Family Medicine

## 2020-10-01 ENCOUNTER — Other Ambulatory Visit: Payer: Self-pay

## 2020-10-01 VITALS — BP 132/60 | HR 72 | Temp 97.3°F | Resp 16 | Ht 72.0 in | Wt 220.2 lb

## 2020-10-01 DIAGNOSIS — I2699 Other pulmonary embolism without acute cor pulmonale: Secondary | ICD-10-CM

## 2020-10-01 DIAGNOSIS — I739 Peripheral vascular disease, unspecified: Secondary | ICD-10-CM

## 2020-10-01 DIAGNOSIS — Z23 Encounter for immunization: Secondary | ICD-10-CM | POA: Diagnosis not present

## 2020-10-01 DIAGNOSIS — K219 Gastro-esophageal reflux disease without esophagitis: Secondary | ICD-10-CM

## 2020-10-01 DIAGNOSIS — T50B95A Adverse effect of other viral vaccines, initial encounter: Secondary | ICD-10-CM | POA: Diagnosis not present

## 2020-10-01 DIAGNOSIS — I10 Essential (primary) hypertension: Secondary | ICD-10-CM

## 2020-10-01 DIAGNOSIS — R7303 Prediabetes: Secondary | ICD-10-CM

## 2020-10-01 DIAGNOSIS — R5383 Other fatigue: Secondary | ICD-10-CM

## 2020-10-01 MED ORDER — APIXABAN 5 MG PO TABS: 5.0000 mg | ORAL_TABLET | Freq: Two times a day (BID) | ORAL | 4 refills | Status: DC

## 2020-10-01 NOTE — Progress Notes (Signed)
Subjective:  Patient ID: [name]., male    DOB: 11-22-1941  Age: 79 y.o. MRN: [id number]  Chief Complaint  Patient presents with  . Hospitalization Follow-up - Pulmonary embolism    HPI  Patient is a 79 year old white male who presents for follow-up from hospitalization from September 02, 2020 to September 03, 2020.  The patient had complications from COVID-19 of an acute pulmonary embolism.  He was diagnosed from our office and transferred for admission to [facility].  He was put on heparin for 24 hours and then transitioned over to Eliquis 5 mg twice daily.  I had arranged for the patient to have oxygen the day I saw him in our office and then we ended up sending him to [employer].  He has not required oxygen since being home nor has he kept it.  He called the company and they picked it up. Patient denies any more coughing, sob.  His appetite and fatigue are improving but not back to normal.  He is tolerating his medicine well.  Patient is experiencing some leg pain with ambulation.  They just seem to start aching and then resolve with sitting.  Current Outpatient Medications on File Prior to Visit  Medication Sig Dispense Refill  . cholecalciferol (VITAMIN D3) 25 MCG (1000 UNIT) tablet Take 2,000 Units by mouth daily.    Marland Kitchen acetaminophen (TYLENOL) 325 MG tablet Take 2 tablets (650 mg total) by mouth every 4 (four) hours as needed for mild pain, fever or headache.    . Ascorbic Acid (VITAMIN C) 1000 MG tablet Take 1,000 mg by mouth daily.    Marland Kitchen omeprazole (PRILOSEC) 20 MG capsule Take 20 mg by mouth daily before breakfast.     . propranolol (INDERAL) 20 MG tablet TAKE 1 TABLET (20 MG TOTAL) BY MOUTH 2 (TWO) TIMES DAILY (Patient taking differently: Take 20 mg by mouth 2 (two) times daily. ) 180 tablet 1  . Zinc Sulfate (ZINC-220 PO) Take 1 capsule by mouth daily.      No current facility-administered medications on file prior to visit.   Past Medical History:  Diagnosis  Date  . Arthritis   . Degenerative lumbar spinal stenosis   . Fatty liver   . GERD (gastroesophageal reflux disease)   . Headache   . History of kidney stones   . Hyperlipidemia   . Hypertension   . Mastodynia   . Migraine without aura and without status migrainosus, not intractable   . Neuritis or radiculitis due to rupture of lumbar intervertebral disc 07/15/2018  . Radiculopathy of thoracolumbar region 07/15/2018   Past Surgical History:  Procedure Laterality Date  . ANTERIOR LATERAL LUMBAR FUSION WITH PERCUTANEOUS SCREW 2 LEVEL N/A 07/13/2018   Procedure: ANTERIOR LATERAL LUMBAR FUSION L2-4;  Surgeon: Venita Lick, MD;  Location: [facility] OR;  Service: Orthopedics;  Laterality: N/A;  4 hrs  . CHOLECYSTECTOMY  1999  . GALLBLADDER SURGERY  2016  . NECK SURGERY  1995   Ruptured disc repair  . SHOULDER SURGERY Left 2005  . SHOULDER SURGERY Right 2016  . THROAT SURGERY     stretching    Family History  Problem Relation Age of Onset  . Hypertension Mother   . Cancer Father   . Diabetes Brother   . Migraines Neg Hx    Social History   Socioeconomic History  . Marital status: Married    Spouse name: Darel Hong  . Number of children: 4  . Years of  education: 12+  . Highest education level: Not on file  Occupational History  . Occupation: Retired  Tobacco Use  . Smoking status: Former Smoker    Types: Cigarettes  . Smokeless tobacco: Never Used  . Tobacco comment: 12.2.19-quit at least 40 years ago   Vaping Use  . Vaping Use: Never used  Substance and Sexual Activity  . Alcohol use: No  . Drug use: No  . Sexual activity: Not on file  Other Topics Concern  . Not on file  Social History Narrative   Lives with wife   Caffeine use: Drinks decaf coffee and tea   Writes right-handed, eats and shaves left-handed   Social Determinants of Health   Financial Resource Strain:   . Difficulty of Paying Living Expenses: Not on file  Food Insecurity:   . Worried About Brewing technologist in the Last Year: Not on file  . Ran Out of Food in the Last Year: Not on file  Transportation Needs:   . Lack of Transportation (Medical): Not on file  . Lack of Transportation (Non-Medical): Not on file  Physical Activity:   . Days of Exercise per Week: Not on file  . Minutes of Exercise per Session: Not on file  Stress:   . Feeling of Stress : Not on file  Social Connections:   . Frequency of Communication with Friends and Family: Not on file  . Frequency of Social Gatherings with Friends and Family: Not on file  . Attends Religious Services: Not on file  . Active Member of Clubs or Organizations: Not on file  . Attends Banker Meetings: Not on file  . Marital Status: Not on file    Review of Systems  Constitutional: Positive for fatigue. Negative for chills and fever.  HENT: Positive for congestion (allergies. Using flonase and claritin. ). Negative for ear pain and sore throat.   Respiratory: Negative for cough and shortness of breath.   Cardiovascular: Negative for chest pain and palpitations.  Gastrointestinal: Negative for abdominal pain, constipation, diarrhea, nausea and vomiting.  Genitourinary: Positive for frequency. Negative for dysuria.  Musculoskeletal: Positive for arthralgias and back pain. Negative for myalgias.  Neurological: Positive for headaches (intermittent. ). Negative for dizziness.  Psychiatric/Behavioral: Negative for dysphoric mood. The patient is not nervous/anxious.      Objective:  BP 132/60   Pulse 72   Temp (!) 97.3 F (36.3 C)   Resp 16   Ht 6' (1.829 m)   Wt 220 lb 3.2 oz (99.9 kg)   BMI 29.86 kg/m   BP/Weight 10/01/2020 09/03/2020 09/02/2020  Systolic BP 132 171 -  Diastolic BP 60 96 -  Wt. (Lbs) 220.2 - 219.2  BMI 29.86 - 29.73    Physical Exam Vitals reviewed.  Constitutional:      Appearance: Normal appearance.  Cardiovascular:     Rate and Rhythm: Normal rate and regular rhythm.     Pulses:           Dorsalis pedis pulses are 1+ on the right side and 1+ on the left side.       Posterior tibial pulses are 1+ on the right side and 1+ on the left side.     Heart sounds: Normal heart sounds.  Pulmonary:     Effort: Pulmonary effort is normal.     Breath sounds: Normal breath sounds. No wheezing, rhonchi or rales.  Abdominal:     General: Bowel sounds are normal.  Palpations: Abdomen is soft.     Tenderness: There is no abdominal tenderness.  Neurological:     Mental Status: He is alert and oriented to person, place, and time.  Psychiatric:        Mood and Affect: Mood normal.        Behavior: Behavior normal.      Lab Results  Component Value Date   WBC 6.2 09/03/2020   HGB 12.5 (L) 09/03/2020   HCT 36.7 (L) 09/03/2020   PLT 215 09/03/2020   GLUCOSE 109 (H) 09/03/2020   CHOL 165 05/15/2020   TRIG 120 05/15/2020   HDL 42 05/15/2020   LDLCALC 101 (H) 05/15/2020   ALT 57 (H) 09/03/2020   AST 33 09/03/2020   NA 139 09/03/2020   K 3.6 09/03/2020   CL 105 09/03/2020   CREATININE 0.75 09/03/2020   BUN 10 09/03/2020   CO2 27 09/03/2020   INR 1.6 (H) 09/02/2020      Assessment & Plan:  1. Other acute pulmonary embolism without acute cor pulmonale (HCC) - apixaban (ELIQUIS) 5 MG TABS tablet; Take 1 tablet (5 mg total) by mouth 2 (two) times daily.  Dispense: 60 tablet; Refill: 4  2. Intermittent claudication (HCC) Already on eliquis. Might consider addition of aspirin 81 mg once daily.  Wait until results back.  - VAS [place] ABI WITH/WO TBI  3. Essential hypertension The current medical regimen is effective;  continue present plan and medications. Improved.   4. Prediabetes  Has been well controlled. Is due for A1C.  5. Gastroesophageal reflux disease without esophagitis The current medical regimen is effective;  continue present plan and medications.  6. Fatigue after COVID-19 vaccination IMPROVING  7. Need for immunization against influenza - Flu Vaccine QUAD  High Dose(Fluad)    Meds ordered this encounter  Medications  . apixaban (ELIQUIS) 5 MG TABS tablet    Sig: Take 1 tablet (5 mg total) by mouth 2 (two) times daily.    Dispense:  60 tablet    Refill:  4    Orders Placed This Encounter  Procedures  . Flu Vaccine QUAD High Dose(Fluad)  . VAS [place] ABI WITH/WO TBI     Follow-up: Return in about 3 months (around 01/01/2021).  An After Visit Summary was printed and given to the patient.  Blane Ohara [facility] [phone]

## 2020-10-06 ENCOUNTER — Encounter: Payer: Self-pay | Admitting: Family Medicine

## 2020-10-07 ENCOUNTER — Encounter: Payer: Self-pay | Admitting: Family Medicine

## 2020-10-07 DIAGNOSIS — Z0389 Encounter for observation for other suspected diseases and conditions ruled out: Secondary | ICD-10-CM | POA: Diagnosis not present

## 2020-10-07 DIAGNOSIS — I739 Peripheral vascular disease, unspecified: Secondary | ICD-10-CM | POA: Diagnosis not present

## 2021-01-02 ENCOUNTER — Other Ambulatory Visit: Payer: Self-pay

## 2021-01-02 ENCOUNTER — Ambulatory Visit (INDEPENDENT_AMBULATORY_CARE_PROVIDER_SITE_OTHER): Payer: PPO | Admitting: Family Medicine

## 2021-01-02 VITALS — BP 130/86 | HR 72 | Temp 98.1°F | Resp 18 | Ht 72.0 in | Wt 228.0 lb

## 2021-01-02 DIAGNOSIS — I10 Essential (primary) hypertension: Secondary | ICD-10-CM | POA: Diagnosis not present

## 2021-01-02 DIAGNOSIS — K219 Gastro-esophageal reflux disease without esophagitis: Secondary | ICD-10-CM | POA: Diagnosis not present

## 2021-01-02 DIAGNOSIS — R7303 Prediabetes: Secondary | ICD-10-CM | POA: Diagnosis not present

## 2021-01-02 DIAGNOSIS — I2699 Other pulmonary embolism without acute cor pulmonale: Secondary | ICD-10-CM | POA: Diagnosis not present

## 2021-01-02 DIAGNOSIS — Z6830 Body mass index (BMI) 30.0-30.9, adult: Secondary | ICD-10-CM | POA: Diagnosis not present

## 2021-01-02 NOTE — Progress Notes (Signed)
Subjective:  Patient ID: [name]., male    DOB: 1941-06-20  Age: 80 y.o. MRN: [id number]  Chief Complaint  Patient presents with  . Hypertension   HPI Hypertension: On propranolol 20 mg 2 daily.  Hyperlipidemia: eats healthy and is active.  Prediabetes: eats healthy and is active.  History of pulmonary embolism: On eliquis 5 mg one twice daily. PE occurred in 08/2020. GERD: On omeprazole 20 mg once daily. Headaches: Neurology increased propranolol to 20 mg 2 daily, which has helped.   Current Outpatient Medications on File Prior to Visit  Medication Sig Dispense Refill  . acetaminophen (TYLENOL) 325 MG tablet Take 2 tablets (650 mg total) by mouth every 4 (four) hours as needed for mild pain, fever or headache.    Marland Kitchen apixaban (ELIQUIS) 5 MG TABS tablet Take 1 tablet (5 mg total) by mouth 2 (two) times daily. 60 tablet 4  . Ascorbic Acid (VITAMIN C) 1000 MG tablet Take 1,000 mg by mouth daily.    . cholecalciferol (VITAMIN D3) 25 MCG (1000 UNIT) tablet Take 2,000 Units by mouth daily.    Marland Kitchen omeprazole (PRILOSEC) 20 MG capsule Take 20 mg by mouth daily before breakfast.     . propranolol (INDERAL) 20 MG tablet TAKE 1 TABLET (20 MG TOTAL) BY MOUTH 2 (TWO) TIMES DAILY (Patient taking differently: Take 20 mg by mouth 2 (two) times daily. ) 180 tablet 1  . Zinc Sulfate (ZINC-220 PO) Take 1 capsule by mouth daily.      No current facility-administered medications on file prior to visit.   Past Medical History:  Diagnosis Date  . Arthritis   . Degenerative lumbar spinal stenosis   . Fatty liver   . GERD (gastroesophageal reflux disease)   . Headache   . History of kidney stones   . Hyperlipidemia   . Hypertension   . Mastodynia   . Migraine without aura and without status migrainosus, not intractable   . Neuritis or radiculitis due to rupture of lumbar intervertebral disc 07/15/2018  . Radiculopathy of thoracolumbar region 07/15/2018   Past Surgical History:  Procedure  Laterality Date  . ANTERIOR LATERAL LUMBAR FUSION WITH PERCUTANEOUS SCREW 2 LEVEL N/A 07/13/2018   Procedure: ANTERIOR LATERAL LUMBAR FUSION L2-4;  Surgeon: Venita Lick, MD;  Location: [facility] OR;  Service: Orthopedics;  Laterality: N/A;  4 hrs  . CHOLECYSTECTOMY  1999  . GALLBLADDER SURGERY  2016  . NECK SURGERY  1995   Ruptured disc repair  . SHOULDER SURGERY Left 2005  . SHOULDER SURGERY Right 2016  . THROAT SURGERY     stretching    Family History  Problem Relation Age of Onset  . Hypertension Mother   . Cancer Father   . Diabetes Brother   . Migraines Neg Hx    Social History   Socioeconomic History  . Marital status: Married    Spouse name: Darel Hong  . Number of children: 4  . Years of education: 12+  . Highest education level: Not on file  Occupational History  . Occupation: Retired  Tobacco Use  . Smoking status: Former Smoker    Types: Cigarettes  . Smokeless tobacco: Never Used  . Tobacco comment: 12.2.19-quit at least 40 years ago   Vaping Use  . Vaping Use: Never used  Substance and Sexual Activity  . Alcohol use: No  . Drug use: No  . Sexual activity: Not on file  Other Topics Concern  . Not on file  Social  History Narrative   Lives with wife   Caffeine use: Drinks decaf coffee and tea   Writes right-handed, eats and shaves left-handed   Social Determinants of Health   Financial Resource Strain: Not on file  Food Insecurity: Not on file  Transportation Needs: Not on file  Physical Activity: Not on file  Stress: Not on file  Social Connections: Not on file    Review of Systems  Constitutional: Positive for fatigue. Negative for chills and fever.  HENT: Positive for ear pain (ear congestion ). Negative for congestion, rhinorrhea and sore throat.   Respiratory: Negative for cough and shortness of breath.   Cardiovascular: Negative for chest pain and palpitations.  Gastrointestinal: Negative for abdominal pain, constipation, diarrhea, nausea and  vomiting.  Genitourinary: Negative for dysuria and urgency.  Musculoskeletal: Negative for arthralgias, back pain and myalgias.  Neurological: Positive for headaches. Negative for dizziness.  Psychiatric/Behavioral: Negative for dysphoric mood. The patient is not nervous/anxious.      Objective:  BP 130/86   Pulse 72   Temp 98.1 F (36.7 C)   Resp 18   Ht 6' (1.829 m)   Wt 228 lb (103.4 kg)   BMI 30.92 kg/m   BP/Weight 01/02/2021 10/01/2020 09/03/2020  Systolic BP 130 132 171  Diastolic BP 86 60 96  Wt. (Lbs) 228 220.2 -  BMI 30.92 29.86 -    Physical Exam Constitutional:      Appearance: Normal appearance.  HENT:     Right Ear: Tympanic membrane, ear canal and external ear normal.     Left Ear: Tympanic membrane, ear canal and external ear normal.     Nose: Nose normal. No congestion or rhinorrhea.     Mouth/Throat:     Mouth: Mucous membranes are moist.     Pharynx: No oropharyngeal exudate or posterior oropharyngeal erythema.  Cardiovascular:     Rate and Rhythm: Normal rate and regular rhythm.     Heart sounds: Normal heart sounds.  Pulmonary:     Effort: Pulmonary effort is normal. No respiratory distress.     Breath sounds: Normal breath sounds. No wheezing, rhonchi or rales.  Abdominal:     General: Bowel sounds are normal.     Palpations: Abdomen is soft.     Tenderness: There is no abdominal tenderness.  Lymphadenopathy:     Cervical: No cervical adenopathy.  Neurological:     Mental Status: He is alert and oriented to person, place, and time.  Psychiatric:        Mood and Affect: Mood normal.        Behavior: Behavior normal.    Lab Results  Component Value Date   WBC 5.8 01/02/2021   HGB 15.8 01/02/2021   HCT 47.1 01/02/2021   PLT 162 01/02/2021   GLUCOSE 104 (H) 01/02/2021   CHOL 164 01/02/2021   TRIG 126 01/02/2021   HDL 42 01/02/2021   LDLCALC 99 01/02/2021   ALT 42 01/02/2021   AST 28 01/02/2021   NA 143 01/02/2021   K 4.8 01/02/2021    CL 105 01/02/2021   CREATININE 0.98 01/02/2021   BUN 12 01/02/2021   CO2 26 01/02/2021   INR 1.6 (H) 09/02/2020   HGBA1C 5.6 01/02/2021      Assessment & Plan:   1. Essential hypertension The current medical regimen is effective;  continue present plan and medications. - Comprehensive metabolic panel - Lipid panel - CBC With Diff/Platelet  2. Prediabetes Low sugar diet. - Hemoglobin  A1c  3. Gastroesophageal reflux disease without esophagitis The current medical regimen is effective;  continue present plan and medications.  4. Other acute pulmonary embolism without acute cor pulmonale (HCC) Continue eliquis through the end of March 2022.  5. BMI 30.0-30.9,adult  Healthy diet and exercise recommended.    Orders Placed This Encounter  Procedures  . Comprehensive metabolic panel  . Hemoglobin A1c  . Lipid panel  . CBC With Diff/Platelet  . Cardiovascular Risk Assessment    Follow-up: Return in about 6 months (around 07/02/2021).  An After Visit Summary was printed and given to the patient.  Blane Ohara, MD [facility] [phone]

## 2021-01-03 LAB — COMPREHENSIVE METABOLIC PANEL
ALT: 42 IU/L (ref 0–44)
AST: 28 IU/L (ref 0–40)
Albumin/Globulin Ratio: 1.7 (ref 1.2–2.2)
Albumin: 4.3 g/dL (ref 3.7–4.7)
Alkaline Phosphatase: 98 IU/L (ref 44–121)
BUN/Creatinine Ratio: 12 (ref 10–24)
BUN: 12 mg/dL (ref 8–27)
Bilirubin Total: 0.4 mg/dL (ref 0.0–1.2)
CO2: 26 mmol/L (ref 20–29)
Calcium: 9.8 mg/dL (ref 8.6–10.2)
Chloride: 105 mmol/L (ref 96–106)
Creatinine, Ser: 0.98 mg/dL (ref 0.76–1.27)
GFR calc Af Amer: 84 mL/min/{1.73_m2} (ref >59)
GFR calc non Af Amer: 73 mL/min/{1.73_m2} (ref >59)
Globulin, Total: 2.6 g/dL (ref 1.5–4.5)
Glucose: 104 mg/dL — ABNORMAL HIGH (ref 65–99)
Potassium: 4.8 mmol/L (ref 3.5–5.2)
Sodium: 143 mmol/L (ref 134–144)
Total Protein: 6.9 g/dL (ref 6.0–8.5)

## 2021-01-03 LAB — CBC WITH DIFF/PLATELET
Basophils Absolute: 0.1 10*3/uL (ref 0.0–0.2)
Basos: 1 %
EOS (ABSOLUTE): 0.2 10*3/uL (ref 0.0–0.4)
Eos: 3 %
Hematocrit: 47.1 % (ref 37.5–51.0)
Hemoglobin: 15.8 g/dL (ref 13.0–17.7)
Immature Grans (Abs): 0 10*3/uL (ref 0.0–0.1)
Immature Granulocytes: 0 %
Lymphocytes Absolute: 1.3 10*3/uL (ref 0.7–3.1)
Lymphs: 23 %
MCH: 29.2 pg (ref 26.6–33.0)
MCHC: 33.5 g/dL (ref 31.5–35.7)
MCV: 87 fL (ref 79–97)
Monocytes Absolute: 0.6 10*3/uL (ref 0.1–0.9)
Monocytes: 10 %
Neutrophils Absolute: 3.7 10*3/uL (ref 1.4–7.0)
Neutrophils: 63 %
Platelets: 162 10*3/uL (ref 150–450)
RBC: 5.42 x10E6/uL (ref 4.14–5.80)
RDW: 13.3 % (ref 11.6–15.4)
WBC: 5.8 10*3/uL (ref 3.4–10.8)

## 2021-01-03 LAB — LIPID PANEL
Chol/HDL Ratio: 3.9 ratio (ref 0.0–5.0)
Cholesterol, Total: 164 mg/dL (ref 100–199)
HDL: 42 mg/dL (ref >39)
LDL Chol Calc (NIH): 99 mg/dL (ref 0–99)
Triglycerides: 126 mg/dL (ref 0–149)
VLDL Cholesterol Cal: 23 mg/dL (ref 5–40)

## 2021-01-03 LAB — CARDIOVASCULAR RISK ASSESSMENT

## 2021-01-03 LAB — HEMOGLOBIN A1C
Est. average glucose Bld gHb Est-mCnc: 114 mg/dL
Hgb A1c MFr Bld: 5.6 % (ref 4.8–5.6)

## 2021-01-05 ENCOUNTER — Encounter: Payer: Self-pay | Admitting: Family Medicine

## 2021-01-26 ENCOUNTER — Other Ambulatory Visit: Payer: Self-pay | Admitting: Family Medicine

## 2021-01-26 DIAGNOSIS — I10 Essential (primary) hypertension: Secondary | ICD-10-CM

## 2021-02-25 ENCOUNTER — Other Ambulatory Visit: Payer: Self-pay | Admitting: Family Medicine

## 2021-02-25 DIAGNOSIS — I2699 Other pulmonary embolism without acute cor pulmonale: Secondary | ICD-10-CM

## 2021-04-01 ENCOUNTER — Encounter: Payer: Self-pay | Admitting: Family Medicine

## 2021-04-01 ENCOUNTER — Other Ambulatory Visit: Payer: Self-pay

## 2021-04-01 ENCOUNTER — Ambulatory Visit (INDEPENDENT_AMBULATORY_CARE_PROVIDER_SITE_OTHER): Payer: PPO | Admitting: Family Medicine

## 2021-04-01 ENCOUNTER — Ambulatory Visit (INDEPENDENT_AMBULATORY_CARE_PROVIDER_SITE_OTHER): Payer: PPO

## 2021-04-01 VITALS — BP 130/88 | HR 78 | Temp 97.7°F | Resp 18 | Ht 72.0 in | Wt 228.6 lb

## 2021-04-01 DIAGNOSIS — K219 Gastro-esophageal reflux disease without esophagitis: Secondary | ICD-10-CM

## 2021-04-01 DIAGNOSIS — D6869 Other thrombophilia: Secondary | ICD-10-CM

## 2021-04-01 DIAGNOSIS — I2699 Other pulmonary embolism without acute cor pulmonale: Secondary | ICD-10-CM | POA: Diagnosis not present

## 2021-04-01 DIAGNOSIS — G43009 Migraine without aura, not intractable, without status migrainosus: Secondary | ICD-10-CM | POA: Diagnosis not present

## 2021-04-01 DIAGNOSIS — R3589 Other polyuria: Secondary | ICD-10-CM | POA: Diagnosis not present

## 2021-04-01 DIAGNOSIS — Z23 Encounter for immunization: Secondary | ICD-10-CM | POA: Diagnosis not present

## 2021-04-01 DIAGNOSIS — Z6831 Body mass index (BMI) 31.0-31.9, adult: Secondary | ICD-10-CM

## 2021-04-01 DIAGNOSIS — R7302 Impaired glucose tolerance (oral): Secondary | ICD-10-CM

## 2021-04-01 LAB — POCT URINALYSIS DIP (CLINITEK)
Bilirubin, UA: NEGATIVE
Blood, UA: NEGATIVE
Glucose, UA: NEGATIVE mg/dL
Ketones, POC UA: NEGATIVE mg/dL
Leukocytes, UA: NEGATIVE
Nitrite, UA: NEGATIVE
POC PROTEIN,UA: NEGATIVE
Spec Grav, UA: 1.02 (ref 1.010–1.025)
Urobilinogen, UA: 0.2 E.U./dL
pH, UA: 6 (ref 5.0–8.0)

## 2021-04-01 NOTE — Progress Notes (Signed)
   [id number]  Name:  Jesse [name].    MRN: [id number] DOB: 26-Feb-1941  04/01/2021  [name] was observed post Covid-19 immunization for 15 minutes without incident. He was provided with Vaccine Information Sheet and instruction to access the V-Safe system.   [name] was instructed to call 911 with any severe reactions post vaccine: Marland Kitchen Difficulty breathing  . Swelling of face and throat  . A fast heartbeat  . A bad rash all over body  . Dizziness and weakness

## 2021-04-01 NOTE — Progress Notes (Signed)
Subjective:  Patient ID: Jesse Heading., male    DOB: 16-Sep-1941  Age: 80 y.o. MRN: [id number]  No chief complaint on file.   HPI Patient presents in follow-up for prediabetes, history of pulmonary embolism secondary to complications from COVID-19.  Patient would like to come off Jesse [name] Eliquis its been more than 6 months.  Patient denies chest pain or breathing problems.  Jesse [name] does report some chronic back pain (muscular) but no radiculopathy.  Labs done in January were at goal.  Jesse [name] blood pressure is elevated today although Jesse [name] does not normally have high blood pressure.  Jesse [name] is on propranolol for migraine prevention which this does help Jesse [name] migraines.  Jesse [name] is also on omeprazole for acid reflux.  Patient is due for Jesse [name] third COVID-vaccine  Current Outpatient Medications on File Prior to Visit  Medication Sig Dispense Refill  . acetaminophen (TYLENOL) 325 MG tablet Take 2 tablets (650 mg total) by mouth every 4 (four) hours as needed for mild pain, fever or headache.    . Ascorbic Acid (VITAMIN C) 1000 MG tablet Take 1,000 mg by mouth daily.    . cholecalciferol (VITAMIN D3) 25 MCG (1000 UNIT) tablet Take 2,000 Units by mouth daily.    Marland Kitchen ELIQUIS 5 MG TABS tablet TAKE 1 TABLET BY MOUTH TWICE A DAY 60 tablet 4  . omeprazole (PRILOSEC) 20 MG capsule Take 20 mg by mouth daily before breakfast.     . propranolol (INDERAL) 20 MG tablet TAKE 1 TABLET BY MOUTH TWICE A DAY 180 tablet 0  . Zinc Sulfate (ZINC-220 PO) Take 1 capsule by mouth daily.      No current facility-administered medications on file prior to visit.   Past Medical History:  Diagnosis Date  . Arthritis   . Degenerative lumbar spinal stenosis   . Fatty liver   . GERD (gastroesophageal reflux disease)   . Headache   . History of kidney stones   . Hyperlipidemia   . Hypertension   . Mastodynia   . Migraine without aura and without status migrainosus, not intractable   . Neuritis or radiculitis due to rupture of lumbar  intervertebral disc 07/15/2018  . Radiculopathy of thoracolumbar region 07/15/2018  . Radiculopathy of thoracolumbar region 07/15/2018   Past Surgical History:  Procedure Laterality Date  . ANTERIOR LATERAL LUMBAR FUSION WITH PERCUTANEOUS SCREW 2 LEVEL N/A 07/13/2018   Procedure: ANTERIOR LATERAL LUMBAR FUSION L2-4;  Surgeon: Venita Lick, MD;  Location: [facility] OR;  Service: Orthopedics;  Laterality: N/A;  4 hrs  . CHOLECYSTECTOMY  1999  . GALLBLADDER SURGERY  2016  . NECK SURGERY  1995   Ruptured disc repair  . SHOULDER SURGERY Left 2005  . SHOULDER SURGERY Right 2016  . THROAT SURGERY     stretching    Family History  Problem Relation Age of Onset  . Hypertension Mother   . Cancer Father   . Diabetes Brother   . Migraines Neg Hx    Social History   Socioeconomic History  . Marital status: Married    Spouse name: Darel Hong  . Number of children: 4  . Years of education: 12+  . Highest education level: Not on file  Occupational History  . Occupation: Retired  Tobacco Use  . Smoking status: Former Smoker    Types: Cigarettes  . Smokeless tobacco: Never Used  . Tobacco comment: 12.2.19-quit at least 40 years ago   Vaping Use  . Vaping Use: Never used  Substance and Sexual  Activity  . Alcohol use: No  . Drug use: No  . Sexual activity: Not on file  Other Topics Concern  . Not on file  Social History Narrative   Lives with wife   Caffeine use: Drinks decaf coffee and tea   Writes right-handed, eats and shaves left-handed   Social Determinants of Health   Financial Resource Strain: Not on file  Food Insecurity: Not on file  Transportation Needs: Not on file  Physical Activity: Not on file  Stress: Not on file  Social Connections: Not on file    Review of Systems  Constitutional: Negative for chills, fatigue and fever.  HENT: Positive for congestion and hearing loss (Left worsening). Negative for ear pain and sore throat.   Respiratory: Negative for cough and shortness  of breath.   Cardiovascular: Negative for chest pain.  Gastrointestinal: Positive for abdominal pain (intermittent, awakens a tnight. resolves with bm or urination. ). Negative for constipation, diarrhea, nausea and vomiting.  Endocrine: Negative for polydipsia, polyphagia and polyuria.  Genitourinary: Negative for dysuria and frequency.  Musculoskeletal: Negative for arthralgias and myalgias.  Neurological: Negative for dizziness and headaches.  Psychiatric/Behavioral: Negative for dysphoric mood.       No dysphoria     Objective:  BP (!) 160/70   Pulse 78   Temp 97.7 F (36.5 C)   Resp 18   Ht 6' (1.829 m)   Wt 228 lb 9.6 oz (103.7 kg)   BMI 31.00 kg/m   BP/Weight 04/01/2021 01/02/2021 10/01/2020  Systolic BP 160 130 132  Diastolic BP 70 86 60  Wt. (Lbs) 228.6 228 220.2  BMI 31 30.92 29.86    Physical Exam Vitals reviewed.  Constitutional:      Appearance: Normal appearance.  HENT:     Right Ear: Tympanic membrane and ear canal normal.     Left Ear: Tympanic membrane and ear canal normal.     Nose: Nose normal. No congestion.     Mouth/Throat:     Pharynx: No oropharyngeal exudate or posterior oropharyngeal erythema.  Neck:     Vascular: No carotid bruit.  Cardiovascular:     Rate and Rhythm: Normal rate and regular rhythm.     Pulses: Normal pulses.     Heart sounds: Normal heart sounds.  Pulmonary:     Effort: Pulmonary effort is normal.     Breath sounds: Normal breath sounds. No wheezing, rhonchi or rales.  Abdominal:     General: Bowel sounds are normal.     Palpations: Abdomen is soft.     Tenderness: There is no abdominal tenderness.  Neurological:     Mental Status: Jesse [name] is alert and oriented to person, place, and time.  Psychiatric:        Mood and Affect: Mood normal.        Behavior: Behavior normal.     Diabetic Foot Exam - Simple   No data filed      Lab Results  Component Value Date   WBC 5.8 01/02/2021   HGB 15.8 01/02/2021   HCT 47.1  01/02/2021   PLT 162 01/02/2021   GLUCOSE 104 (H) 01/02/2021   CHOL 164 01/02/2021   TRIG 126 01/02/2021   HDL 42 01/02/2021   LDLCALC 99 01/02/2021   ALT 42 01/02/2021   AST 28 01/02/2021   NA 143 01/02/2021   K 4.8 01/02/2021   CL 105 01/02/2021   CREATININE 0.98 01/02/2021   BUN 12 01/02/2021   CO2 26  01/02/2021   INR 1.6 (H) 09/02/2020   HGBA1C 5.6 01/02/2021      Assessment & Plan:   1. Gastroesophageal reflux disease without esophagitis The current medical regimen is effective;  continue present plan and medications.  2. Polyuria - POCT URINALYSIS DIP (CLINITEK) normal  3. Migraine without aura and without status migrainosus, not intractable The current medical regimen is effective;  continue present plan and medications.  4. Pulmonary embolism and infarction (HCC) Stop eliquis. Jesse [name] is > 6 months out from PE.   5. BMI 31.  Recommend continue to work on eating healthy diet and exercise.  6. 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 Discontinuing eliquis as it is no longer needed.   7. Impaired glucose tolerance  a1c was excellent in 12/2020.  Orders Placed This Encounter  Procedures  . POCT URINALYSIS DIP (CLINITEK)     Follow-up: Return in about 5 months (around 09/01/2021) for fasting.Marland Kitchen  An After Visit Summary was printed and given to the patient.  Blane Ohara, MD [facility] [phone]

## 2021-04-06 ENCOUNTER — Encounter: Payer: Self-pay | Admitting: Family Medicine

## 2021-04-08 DIAGNOSIS — J302 Other seasonal allergic rhinitis: Secondary | ICD-10-CM | POA: Diagnosis not present

## 2021-04-08 DIAGNOSIS — J3489 Other specified disorders of nose and nasal sinuses: Secondary | ICD-10-CM | POA: Diagnosis not present

## 2021-04-08 DIAGNOSIS — H9192 Unspecified hearing loss, left ear: Secondary | ICD-10-CM | POA: Diagnosis not present

## 2021-04-08 DIAGNOSIS — R0981 Nasal congestion: Secondary | ICD-10-CM | POA: Diagnosis not present

## 2021-04-08 DIAGNOSIS — Z974 Presence of external hearing-aid: Secondary | ICD-10-CM | POA: Diagnosis not present

## 2021-04-08 DIAGNOSIS — J342 Deviated nasal septum: Secondary | ICD-10-CM | POA: Diagnosis not present

## 2021-04-08 DIAGNOSIS — J343 Hypertrophy of nasal turbinates: Secondary | ICD-10-CM | POA: Diagnosis not present

## 2021-04-08 DIAGNOSIS — H9193 Unspecified hearing loss, bilateral: Secondary | ICD-10-CM | POA: Diagnosis not present

## 2021-04-08 DIAGNOSIS — H6982 Other specified disorders of Eustachian tube, left ear: Secondary | ICD-10-CM | POA: Diagnosis not present

## 2021-04-26 ENCOUNTER — Other Ambulatory Visit: Payer: Self-pay | Admitting: Physician Assistant

## 2021-04-26 DIAGNOSIS — I10 Essential (primary) hypertension: Secondary | ICD-10-CM

## 2021-04-29 DIAGNOSIS — Z8669 Personal history of other diseases of the nervous system and sense organs: Secondary | ICD-10-CM | POA: Diagnosis not present

## 2021-04-29 DIAGNOSIS — J302 Other seasonal allergic rhinitis: Secondary | ICD-10-CM | POA: Diagnosis not present

## 2021-04-29 DIAGNOSIS — H9190 Unspecified hearing loss, unspecified ear: Secondary | ICD-10-CM | POA: Diagnosis not present

## 2021-04-29 DIAGNOSIS — Z974 Presence of external hearing-aid: Secondary | ICD-10-CM | POA: Diagnosis not present

## 2021-05-08 ENCOUNTER — Encounter: Payer: Self-pay | Admitting: Legal Medicine

## 2021-05-08 ENCOUNTER — Ambulatory Visit (INDEPENDENT_AMBULATORY_CARE_PROVIDER_SITE_OTHER): Payer: PPO | Admitting: Legal Medicine

## 2021-05-08 ENCOUNTER — Other Ambulatory Visit: Payer: Self-pay

## 2021-05-08 VITALS — BP 128/78 | HR 65 | Temp 97.5°F | Ht 72.0 in | Wt 225.0 lb

## 2021-05-08 DIAGNOSIS — L0390 Cellulitis, unspecified: Secondary | ICD-10-CM | POA: Diagnosis not present

## 2021-05-08 MED ORDER — DOXYCYCLINE HYCLATE 100 MG PO TABS: 100.0000 mg | ORAL_TABLET | Freq: Two times a day (BID) | ORAL | 0 refills | Status: DC

## 2021-05-08 MED ORDER — MUPIROCIN 2 % EX OINT: 1.0000 "application " | TOPICAL_OINTMENT | Freq: Two times a day (BID) | CUTANEOUS | 0 refills | Status: DC

## 2021-05-08 NOTE — Progress Notes (Signed)
Acute Office Visit  Subjective:    Patient ID: Jesse Heading., male    DOB: 11-08-1941, 80 y.o.   MRN: [id number]  Chief Complaint  Patient presents with  . Insect Bite    HPI Patient is in today for unsure if he pulled tick from his right underarm or pulled a mole off. Area is kind of itchy and red. He pulled off tick 05/07/2020 Past Medical History:  Diagnosis Date  . Arthritis   . Degenerative lumbar spinal stenosis   . Fatty liver   . GERD (gastroesophageal reflux disease)   . Headache   . History of kidney stones   . Hyperlipidemia   . Hypertension   . Mastodynia   . Migraine without aura and without status migrainosus, not intractable   . Neuritis or radiculitis due to rupture of lumbar intervertebral disc 07/15/2018  . Radiculopathy of thoracolumbar region 07/15/2018  . Radiculopathy of thoracolumbar region 07/15/2018    Past Surgical History:  Procedure Laterality Date  . ANTERIOR LATERAL LUMBAR FUSION WITH PERCUTANEOUS SCREW 2 LEVEL N/A 07/13/2018   Procedure: ANTERIOR LATERAL LUMBAR FUSION L2-4;  Surgeon: Venita Lick, MD;  Location: [facility] OR;  Service: Orthopedics;  Laterality: N/A;  4 hrs  . CHOLECYSTECTOMY  1999  . GALLBLADDER SURGERY  2016  . NECK SURGERY  1995   Ruptured disc repair  . SHOULDER SURGERY Left 2005  . SHOULDER SURGERY Right 2016  . THROAT SURGERY     stretching    Family History  Problem Relation Age of Onset  . Hypertension Mother   . Cancer Father   . Diabetes Brother   . Migraines Neg Hx     Social History   Socioeconomic History  . Marital status: Married    Spouse name: Darel Hong  . Number of children: 4  . Years of education: 12+  . Highest education level: Not on file  Occupational History  . Occupation: Retired  Tobacco Use  . Smoking status: Former Smoker    Types: Cigarettes  . Smokeless tobacco: Never Used  . Tobacco comment: 12.2.19-quit at least 40 years ago   Vaping Use  . Vaping Use: Never used  Substance and  Sexual Activity  . Alcohol use: No  . Drug use: No  . Sexual activity: Not on file  Other Topics Concern  . Not on file  Social History Narrative   Lives with wife   Caffeine use: Drinks decaf coffee and tea   Writes right-handed, eats and shaves left-handed   Social Determinants of Health   Financial Resource Strain: Not on file  Food Insecurity: Not on file  Transportation Needs: Not on file  Physical Activity: Not on file  Stress: Not on file  Social Connections: Not on file  Intimate Partner Violence: Not on file    Outpatient Medications Prior to Visit  Medication Sig Dispense Refill  . acetaminophen (TYLENOL) 325 MG tablet Take 2 tablets (650 mg total) by mouth every 4 (four) hours as needed for mild pain, fever or headache.    . Ascorbic Acid (VITAMIN C) 1000 MG tablet Take 1,000 mg by mouth daily.    . cholecalciferol (VITAMIN D3) 25 MCG (1000 UNIT) tablet Take 2,000 Units by mouth daily.    Marland Kitchen omeprazole (PRILOSEC) 20 MG capsule Take 20 mg by mouth daily before breakfast.     . propranolol (INDERAL) 20 MG tablet TAKE 1 TABLET BY MOUTH TWICE A DAY 180 tablet 1  . Zinc Sulfate (  ZINC-220 PO) Take 1 capsule by mouth daily.     Marland Kitchen ELIQUIS 5 MG TABS tablet TAKE 1 TABLET BY MOUTH TWICE A DAY 60 tablet 4   No facility-administered medications prior to visit.    Allergies  Allergen Reactions  . Penicillins Rash    Review of Systems  Constitutional: Negative for activity change and appetite change.  HENT: Negative for congestion and rhinorrhea.   Respiratory: Negative for chest tightness and shortness of breath.   Cardiovascular: Negative for chest pain, palpitations and leg swelling.  Gastrointestinal: Negative for abdominal distention and abdominal pain.  Genitourinary: Negative for difficulty urinating and dysuria.  Musculoskeletal: Negative for arthralgias and back pain.  Skin: Positive for rash (at bite site).  Neurological: Negative for dizziness and headaches.   Psychiatric/Behavioral: Negative.        Objective:    Physical Exam Vitals reviewed.  Constitutional:      Appearance: Normal appearance.  HENT:     Right Ear: Tympanic membrane normal.     Left Ear: Tympanic membrane normal.     Nose: Nose normal.  Eyes:     Extraocular Movements: Extraocular movements intact.     Conjunctiva/sclera: Conjunctivae normal.     Pupils: Pupils are equal, round, and reactive to light.  Cardiovascular:     Rate and Rhythm: Normal rate and regular rhythm.     Pulses: Normal pulses.     Heart sounds: Normal heart sounds. No murmur heard. No gallop.   Pulmonary:     Effort: Pulmonary effort is normal.     Breath sounds: Normal breath sounds.  Lymphadenopathy:     Cervical: No cervical adenopathy.  Skin:    Findings: Erythema present.     Comments: Puncture site right axilla with surrounded by erythema 1cm.  Neurological:     General: No focal deficit present.     Mental Status: He is alert and oriented to person, place, and time.     BP 128/78   Pulse 65   Temp (!) 97.5 F (36.4 C)   Ht 6' (1.829 m)   Wt 225 lb (102.1 kg)   SpO2 99%   BMI 30.52 kg/m  Wt Readings from Last 3 Encounters:  05/08/21 225 lb (102.1 kg)  04/01/21 228 lb 9.6 oz (103.7 kg)  01/02/21 228 lb (103.4 kg)    Health Maintenance Due  Topic Date Due  . Hepatitis C Screening  Never done  . Zoster Vaccines- Shingrix (1 of 2) Never done  . Pneumococcal Vaccine 54-85 Years old (1 of 2 - PPSV23) Never done    There are no preventive care reminders to display for this patient.   No results found for: TSH Lab Results  Component Value Date   WBC 5.8 01/02/2021   HGB 15.8 01/02/2021   HCT 47.1 01/02/2021   MCV 87 01/02/2021   PLT 162 01/02/2021   Lab Results  Component Value Date   NA 143 01/02/2021   K 4.8 01/02/2021   CO2 26 01/02/2021   GLUCOSE 104 (H) 01/02/2021   BUN 12 01/02/2021   CREATININE 0.98 01/02/2021   BILITOT 0.4 01/02/2021   ALKPHOS 98  01/02/2021   AST 28 01/02/2021   ALT 42 01/02/2021   PROT 6.9 01/02/2021   ALBUMIN 4.3 01/02/2021   CALCIUM 9.8 01/02/2021   ANIONGAP 7 09/03/2020   Lab Results  Component Value Date   CHOL 164 01/02/2021   Lab Results  Component Value Date   HDL 42  01/02/2021   Lab Results  Component Value Date   LDLCALC 99 01/02/2021   Lab Results  Component Value Date   TRIG 126 01/02/2021   Lab Results  Component Value Date   CHOLHDL 3.9 01/02/2021   Lab Results  Component Value Date   HGBA1C 5.6 01/02/2021       Assessment & Plan:  Diagnoses and all orders for this visit: Cellulitis, unspecified cellulitis site -     doxycycline (VIBRA-TABS) 100 MG tablet; Take 1 tablet (100 mg total) by mouth 2 (two) times daily. -     mupirocin ointment (BACTROBAN) 2 %; Apply 1 application topically 2 (two) times daily. Patient is having cellulitis right axillary area, start on doxycycline and bactroban       I spent 20 minutes dedicated to the care of this patient on the date of this encounter to include face-to-face time with the patient, as well as:   Follow-up: Return if symptoms worsen or fail to improve.  An After Visit Summary was printed and given to the patient.  Brent Bulla, MD [facility] [phone]

## 2021-07-09 DIAGNOSIS — K222 Esophageal obstruction: Secondary | ICD-10-CM | POA: Diagnosis not present

## 2021-07-09 DIAGNOSIS — K2270 Barrett's esophagus without dysplasia: Secondary | ICD-10-CM | POA: Diagnosis not present

## 2021-07-09 DIAGNOSIS — K649 Unspecified hemorrhoids: Secondary | ICD-10-CM | POA: Diagnosis not present

## 2021-07-09 DIAGNOSIS — K219 Gastro-esophageal reflux disease without esophagitis: Secondary | ICD-10-CM | POA: Diagnosis not present

## 2021-08-10 ENCOUNTER — Telehealth: Payer: Self-pay

## 2021-08-10 NOTE — Telephone Encounter (Signed)
I left voicemail to call us back to set up an appointment for AWV.  

## 2021-09-02 ENCOUNTER — Ambulatory Visit (INDEPENDENT_AMBULATORY_CARE_PROVIDER_SITE_OTHER): Payer: PPO | Admitting: Nurse Practitioner

## 2021-09-02 ENCOUNTER — Encounter: Payer: Self-pay | Admitting: Nurse Practitioner

## 2021-09-02 VITALS — BP 152/78 | HR 68 | Temp 98.1°F | Ht 72.0 in | Wt 232.0 lb

## 2021-09-02 DIAGNOSIS — H65112 Acute and subacute allergic otitis media (mucoid) (sanguinous) (serous), left ear: Secondary | ICD-10-CM | POA: Diagnosis not present

## 2021-09-02 DIAGNOSIS — J018 Other acute sinusitis: Secondary | ICD-10-CM | POA: Diagnosis not present

## 2021-09-02 DIAGNOSIS — J3089 Other allergic rhinitis: Secondary | ICD-10-CM

## 2021-09-02 DIAGNOSIS — J029 Acute pharyngitis, unspecified: Secondary | ICD-10-CM

## 2021-09-02 DIAGNOSIS — R059 Cough, unspecified: Secondary | ICD-10-CM

## 2021-09-02 LAB — POCT RAPID STREP A (OFFICE): Rapid Strep A Screen: NEGATIVE

## 2021-09-02 LAB — POC COVID19 BINAXNOW: SARS Coronavirus 2 Ag: NEGATIVE

## 2021-09-02 MED ORDER — NEOMYCIN-POLYMYXIN-DEXAMETH 3.5-10000-0.1 OP SUSP: OPHTHALMIC | 0 refills | Status: DC

## 2021-09-02 MED ORDER — AZITHROMYCIN 250 MG PO TABS: ORAL_TABLET | ORAL | 0 refills | Status: AC

## 2021-09-02 NOTE — Progress Notes (Signed)
Acute Office Visit  Subjective:    Patient ID: Jesse [name]., male    DOB: 12-10-1940, 80 y.o.   MRN: [id number]  Chief Complaint  Patient presents with   Sore Throat    Sinuses/Headache/Drainage   HPI  Patient is in today for sinus congestion, post-nasal-drip, cough, sore throat, headache and generalized body aches. Onset of symptoms was three days ago. States his spouse is also ill but has different symptoms.  Treatment has included Tylenol for headache and Mucinex for cough. States he has chronic allergic rhinitis that is treated with Flonase nasal spray and OTC antihistamine that he buys in bulk from [employer], he is unable to recall name. He has obtained COVID-19 vaccines and booster as well as flu immunization.  States he had COVID-19 last Oct 2021 that cause PE.He was seen at [facility] ED where he spent 27 hours and was released on Eliquis.   Past Medical History:  Diagnosis Date   Arthritis    Degenerative lumbar spinal stenosis    Fatty liver    GERD (gastroesophageal reflux disease)    Headache    History of kidney stones    Hyperlipidemia    Hypertension    Mastodynia    Migraine without aura and without status migrainosus, not intractable    Neuritis or radiculitis due to rupture of lumbar intervertebral disc 07/15/2018   Radiculopathy of thoracolumbar region 07/15/2018   Radiculopathy of thoracolumbar region 07/15/2018    Past Surgical History:  Procedure Laterality Date   ANTERIOR LATERAL LUMBAR FUSION WITH PERCUTANEOUS SCREW 2 LEVEL N/A 07/13/2018   Procedure: ANTERIOR LATERAL LUMBAR FUSION L2-4;  Surgeon: Venita Lick, MD;  Location: [facility] OR;  Service: Orthopedics;  Laterality: N/A;  4 hrs   CHOLECYSTECTOMY  1999   GALLBLADDER SURGERY  2016   NECK SURGERY  1995   Ruptured disc repair   SHOULDER SURGERY Left 2005   SHOULDER SURGERY Right 2016   THROAT SURGERY     stretching    Family History  Problem Relation Age of Onset   Hypertension  Mother    Cancer Father    Diabetes Brother    Migraines Neg Hx     Social History   Socioeconomic History   Marital status: Married    Spouse name: Darel Hong   Number of children: 4   Years of education: 12+   Highest education level: Not on file  Occupational History   Occupation: Retired  Tobacco Use   Smoking status: Former    Types: Cigarettes   Smokeless tobacco: Never   Tobacco comments:    12.2.19-quit at least 40 years ago   [employer]vertising account planner   Vaping Use: Never used  Substance and Sexual Activity   Alcohol use: No   Drug use: No   Sexual activity: Not on file  Other Topics Concern   Not on file  Social History Narrative   Lives with wife   Caffeine use: Drinks decaf coffee and tea   Writes right-handed, eats and shaves left-handed   Social Determinants of Health   Financial Resource Strain: Not on file  Food Insecurity: Not on file  Transportation Needs: Not on file  Physical Activity: Not on file  Stress: Not on file  Social Connections: Not on file  Intimate Partner Violence: Not on file    Outpatient Medications Prior to Visit  Medication Sig Dispense Refill   acetaminophen (TYLENOL) 325 MG tablet Take 2 tablets (650 mg total) by mouth  every 4 (four) hours as needed for mild pain, fever or headache.     Ascorbic Acid (VITAMIN C) 1000 MG tablet Take 1,000 mg by mouth daily.     cholecalciferol (VITAMIN D3) 25 MCG (1000 UNIT) tablet Take 2,000 Units by mouth daily.     fluticasone (FLONASE) 50 MCG/ACT nasal spray Place 2 sprays into both nostrils daily.     mupirocin ointment (BACTROBAN) 2 % Apply 1 application topically 2 (two) times daily. 22 g 0   omeprazole (PRILOSEC) 20 MG capsule Take 20 mg by mouth daily before breakfast.      propranolol (INDERAL) 20 MG tablet TAKE 1 TABLET BY MOUTH TWICE A DAY 180 tablet 1   Zinc Sulfate (ZINC-220 PO) Take 1 capsule by mouth daily.      doxycycline (VIBRA-TABS) 100 MG tablet Take 1 tablet (100 mg total) by mouth 2 (two)  times daily. 20 tablet 0   No facility-administered medications prior to visit.    Allergies  Allergen Reactions   Penicillins Rash    Review of Systems  Constitutional:  Positive for appetite change (decreased), fatigue and unexpected weight change (decreased). Negative for fever.  HENT:  Positive for congestion, ear pain (left ear), hearing loss (bilateral hearing aids), postnasal drip, rhinorrhea, sinus pressure, sinus pain and sore throat.   Eyes:  Positive for pain (pain behind bilateral eyes).  Respiratory:  Positive for cough and shortness of breath (mild).   Cardiovascular:  Negative for chest pain and leg swelling.  Gastrointestinal:  Negative for abdominal pain, constipation, diarrhea, nausea and vomiting.  Endocrine: Negative.   Genitourinary:  Negative for dysuria and frequency.  Musculoskeletal:  Negative for arthralgias and myalgias (generalized body aches).  Skin: Negative.   Allergic/Immunologic: Positive for environmental allergies.  Neurological:  Positive for headaches. Negative for dizziness.  Hematological: Negative.   Psychiatric/Behavioral:  Negative for dysphoric mood. The patient is not nervous/anxious.       Objective:    Physical Exam Vitals reviewed.  Constitutional:      Appearance: Normal appearance. He is well-developed and normal weight.  HENT:     Right Ear: External ear normal. Tenderness present. Tympanic membrane is erythematous.     Left Ear: External ear normal. Swelling and tenderness present. A middle ear effusion is present. Tympanic membrane is erythematous.     Nose: Congestion and rhinorrhea present.     Mouth/Throat:     Mouth: Mucous membranes are dry.     Pharynx: Posterior oropharyngeal erythema present.  Eyes:     Pupils: Pupils are equal, round, and reactive to light.  Cardiovascular:     Rate and Rhythm: Normal rate and regular rhythm.     Pulses: Normal pulses.     Heart sounds: Normal heart sounds.  Pulmonary:      Breath sounds: Normal breath sounds.  Abdominal:     General: Abdomen is flat. Bowel sounds are normal.     Palpations: Abdomen is soft.  Musculoskeletal:        General: Normal range of motion.     Cervical back: Normal range of motion.  Skin:    General: Skin is warm and dry.     Capillary Refill: Capillary refill takes less than 2 seconds.  Neurological:     General: No focal deficit present.     Mental Status: He is alert and oriented to person, place, and time.  Psychiatric:        Mood and Affect: Mood normal.  Behavior: Behavior normal.    BP (!) 152/78 (BP Location: Left Arm, Patient Position: Sitting)   Pulse 68   Temp 98.1 F (36.7 C) (Temporal)   Ht 6' (1.829 m)   Wt 232 lb (105.2 kg)   SpO2 97%   BMI 31.46 kg/m  Wt Readings from Last 3 Encounters:  09/02/21 232 lb (105.2 kg)  05/08/21 225 lb (102.1 kg)  04/01/21 228 lb 9.6 oz (103.7 kg)    Health Maintenance Due  Topic Date Due   Zoster Vaccines- Shingrix (1 of 2) Never done   INFLUENZA VACCINE  07/07/2021   COVID-19 Vaccine (4 - Booster for Pfizer series) 08/01/2021     Lab Results  Component Value Date   WBC 5.8 01/02/2021   HGB 15.8 01/02/2021   HCT 47.1 01/02/2021   MCV 87 01/02/2021   PLT 162 01/02/2021   Lab Results  Component Value Date   NA 143 01/02/2021   K 4.8 01/02/2021   CO2 26 01/02/2021   GLUCOSE 104 (H) 01/02/2021   BUN 12 01/02/2021   CREATININE 0.98 01/02/2021   BILITOT 0.4 01/02/2021   ALKPHOS 98 01/02/2021   AST 28 01/02/2021   ALT 42 01/02/2021   PROT 6.9 01/02/2021   ALBUMIN 4.3 01/02/2021   CALCIUM 9.8 01/02/2021   ANIONGAP 7 09/03/2020   Lab Results  Component Value Date   CHOL 164 01/02/2021   Lab Results  Component Value Date   HDL 42 01/02/2021   Lab Results  Component Value Date   LDLCALC 99 01/02/2021   Lab Results  Component Value Date   TRIG 126 01/02/2021   Lab Results  Component Value Date   CHOLHDL 3.9 01/02/2021   Lab Results   Component Value Date   HGBA1C 5.6 01/02/2021         Assessment & Plan:   1. Acute non-recurrent sinusitis of other sinus - neomycin-polymyxin b-dexamethasone (MAXITROL) 3.5-10000-0.1 SUSP; Instill 2 drops left ear twice daily for 5 days  Dispense: 5 mL; Refill: 0 - azithromycin (ZITHROMAX) 250 MG tablet; Take 2 tablets on day 1, then 1 tablet daily on days 2 through 5  Dispense: 6 tablet; Refill: 0  2. Sore throat - POC COVID-19 BinaxNow-negative - POCT rapid strep A-negative -continue Tylenol as directed -warm salt water gargles, throat lozenges  3. Cough -rest and push fluids  -continue Mucinex as directed  4. Seasonal allergic rhinitis due to other allergic trigger -continue  Flonase nasal spray and OTC antihistamine  5. Non-recurrent acute allergic otitis media of left ear - neomycin-polymyxin b-dexamethasone (MAXITROL) 3.5-10000-0.1 SUSP; Instill 2 drops left ear twice daily for 5 days  Dispense: 5 mL; Refill: 0 - azithromycin (ZITHROMAX) 250 MG tablet; Take 2 tablets on day 1, then 1 tablet daily on days 2 through 5  Dispense: 6 tablet; Refill: 0   Continue Flonase nasal spray and allergy medication Take Z-pack as directed Instill 2 drops Neomycin-Polymixin-Dexamethasone to left ear twice daily Rest and push fluids Monitor BP and keep log Recommend replace toothbrushes and toothpaste Follow-up as needed   I,Lauren M Auman,acting as a scribe for [employer], NP.,have documented all relevant documentation on the behalf of Janie Morning, NP,as directed by  Janie Morning, NP while in the presence of Janie Morning, NP.   I, Janie Morning, NP, have reviewed all documentation for this visit. The documentation on 09/02/21 for the exam, diagnosis, procedures, and orders are all accurate and complete.  SignedFlonnie Hailstone, DNP 09/02/21 at 9:53 PM

## 2021-09-02 NOTE — Patient Instructions (Addendum)
Continue Flonase nasal spray and allergy medication Take Z-pack as directed Instill 2 drops Neomycin-Polymixin-Dexamethasone to left ear twice daily Rest and push fluids Monitor BP and keep log Recommend replace toothbrushes and toothpaste Follow-up as needed   Sinusitis, Adult Sinusitis is soreness and swelling (inflammation) of your sinuses. Sinuses are hollow spaces in the bones around your face. They are located: Around your eyes. In the middle of your forehead. Behind your nose. In your cheekbones. Your sinuses and nasal passages are lined with a fluid called mucus. Mucus drains out of your sinuses. Swelling can trap mucus in your sinuses. This lets germs (bacteria, virus, or fungus) grow, which leads to infection. Most of the time, this condition is caused by a virus. What are the causes? This condition is caused by: Allergies. Asthma. Germs. Things that block your nose or sinuses. Growths in the nose (nasal polyps). Chemicals or irritants in the air. Fungus (rare). What increases the risk? You are more likely to develop this condition if: You have a weak body defense system (immune system). You do a lot of swimming or diving. You use nasal sprays too much. You smoke. What are the signs or symptoms? The main symptoms of this condition are pain and a feeling of pressure around the sinuses. Other symptoms include: Stuffy nose (congestion). Runny nose (drainage). Swelling and warmth in the sinuses. Headache. Toothache. A cough that may get worse at night. Mucus that collects in the throat or the back of the nose (postnasal drip). Being unable to smell and taste. Being very tired (fatigue). A fever. Sore throat. Bad breath. How is this diagnosed? This condition is diagnosed based on: Your symptoms. Your medical history. A physical exam. Tests to find out if your condition is short-term (acute) or long-term (chronic). Your doctor may: Check your nose for growths  (polyps). Check your sinuses using a tool that has a light (endoscope). Check for allergies or germs. Do imaging tests, such as an MRI or CT scan. How is this treated? Treatment for this condition depends on the cause and whether it is short-term or long-term. If caused by a virus, your symptoms should go away on their own within 10 days. You may be given medicines to relieve symptoms. They include: Medicines that shrink swollen tissue in the nose. Medicines that treat allergies (antihistamines). A spray that treats swelling of the nostrils.  Rinses that help get rid of thick mucus in your nose (nasal saline washes). If caused by bacteria, your doctor may wait to see if you will get better without treatment. You may be given antibiotic medicine if you have: A very bad infection. A weak body defense system. If caused by growths in the nose, you may need to have surgery. Follow these instructions at home: Medicines Take, use, or apply over-the-counter and prescription medicines only as told by your doctor. These may include nasal sprays. If you were prescribed an antibiotic medicine, take it as told by your doctor. Do not stop taking the antibiotic even if you start to feel better. Hydrate and humidify  Drink enough water to keep your pee (urine) pale yellow. Use a cool mist humidifier to keep the humidity level in your home above 50%. Breathe in steam for 10-15 minutes, 3-4 times a day, or as told by your doctor. You can do this in the bathroom while a hot shower is running. Try not to spend time in cool or dry air. Rest Rest as much as you can. Sleep with your  head raised (elevated). Make sure you get enough sleep each night. General instructions  Put a warm, moist washcloth on your face 3-4 times a day, or as often as told by your doctor. This will help with discomfort. Wash your hands often with soap and water. If there is no soap and water, use hand sanitizer. Do not smoke. Avoid  being around people who are smoking (secondhand smoke). Keep all follow-up visits as told by your doctor. This is important. Contact a doctor if: You have a fever. Your symptoms get worse. Your symptoms do not get better within 10 days. Get help right away if: You have a very bad headache. You cannot stop throwing up (vomiting). You have very bad pain or swelling around your face or eyes. You have trouble seeing. You feel confused. Your neck is stiff. You have trouble breathing. Summary Sinusitis is swelling of your sinuses. Sinuses are hollow spaces in the bones around your face. This condition is caused by tissues in your nose that become inflamed or swollen. This traps germs. These can lead to infection. If you were prescribed an antibiotic medicine, take it as told by your doctor. Do not stop taking it even if you start to feel better. Keep all follow-up visits as told by your doctor. This is important. This information is not intended to replace advice given to you by your health care provider. Make sure you discuss any questions you have with your health care provider. Document Revised: 04/25/2018 Document Reviewed: 04/25/2018 Elsevier Patient Education  2022 [employer] Otitis Media, Adult Otitis media is a condition in which the middle ear is red and swollen (inflamed) and full of fluid. The middle ear is the part of the ear that contains bones for hearing as well as air that helps send sounds to the brain. The condition usually goes away on its own. What are the causes? This condition is caused by a blockage in the eustachian tube. This tube connects the middle ear to the back of the nose. It normally allows air into the middle ear. The blockage is caused by fluid or swelling. Problems that can cause blockage include: A cold or infection that affects the nose, mouth, or throat. Allergies. An irritant, such as tobacco smoke. Adenoids that have become large. The adenoids are  soft tissue located in the back of the throat, behind the nose and the roof of the mouth. Growth or swelling in the upper part of the throat, just behind the nose (nasopharynx). Damage to the ear caused by a change in pressure. This is called barotrauma. What increases the risk? You are more likely to develop this condition if you: Smoke or are exposed to tobacco smoke. Have an opening in the roof of your mouth (cleft palate). Have acid reflux. Have problems in your body's defense system (immune system). What are the signs or symptoms? Symptoms of this condition include: Ear pain. Fever. Problems with hearing. Being tired. Fluid leaking from the ear. Ringing in the ear. How is this treated? This condition can go away on its own within 3-5 days. But if the condition is caused by germs (bacteria) and does not go away on its own, or if it keeps coming back, your doctor may: Give you antibiotic medicines. Give you medicines for pain. Follow these instructions at home: Take over-the-counter and prescription medicines only as told by your doctor. If you were prescribed an antibiotic medicine, take it as told by your doctor. Do not stop taking  it even if you start to feel better. Keep all follow-up visits. Contact a doctor if: You have bleeding from your nose. There is a lump on your neck. You are not feeling better in 5 days. You feel worse instead of better. Get help right away if: You have pain that is not helped with medicine. You have swelling, redness, or pain around your ear. You get a stiff neck. You cannot move part of your face (paralysis). You notice that the bone behind your ear hurts when you touch it. You get a very bad headache. Summary Otitis media means that the middle ear is red, swollen, and full of fluid. This condition usually goes away on its own. If the problem does not go away, treatment may be needed. You may be given medicines to treat the infection or to  treat your pain. If you were prescribed an antibiotic medicine, take it as told by your doctor. Do not stop taking it even if you start to feel better. Keep all follow-up visits. This information is not intended to replace advice given to you by your health care provider. Make sure you discuss any questions you have with your health care provider. Preventing Hypertension Hypertension, also called high blood pressure, is when the force of blood pumping through the arteries is too strong. Arteries are blood vessels that carry blood from the heart throughout the body. Often, hypertension does not cause symptoms until blood pressure is very high. It is important to have your blood pressure checked regularly. Diet and lifestyle changes can help you prevent hypertension, and they may make you feel better overall and improve your quality of life. If you already have hypertension, you may control it with diet and lifestyle changes, as well as with medicine. How can this condition affect me? Over time, hypertension can damage the arteries and decrease blood flow to important parts of the body, including the brain, heart, and kidneys. By keeping your blood pressure in a healthy range, you can help prevent complications like heart attack, heart failure, stroke, kidney failure, and vascular dementia. What can increase my risk? Being an older adult. Older people are more often affected. Having family members who have had high blood pressure. Being obese. Being male. Males are more likely to have high blood pressure. Drinking too much alcohol or caffeine. Smoking or using illegal drugs. Taking certain medicines, such as antidepressants, decongestants, birth control pills, and NSAIDs, such as ibuprofen. Having thyroid problems. Having certain tumors. What actions can I take to prevent or manage this condition? Work with your health care provider to make a hypertension prevention plan that works for you. Follow  your plan and keep all follow-up visits as told by your health care provider. Diet changes Maintain a healthy diet. This includes: Eating less salt (sodium). Ask your health care provider how much sodium is safe for you to have. The general recommendation is to have less than 1 tsp (2,300 mg) of sodium a day. Do not add salt to your food. Choose low-sodium options when grocery shopping and eating out. Limiting fats in your diet. You can do this by eating low-fat or fat-free dairy products and by eating less red meat. Eating more fruits, vegetables, and whole grains. Make a goal to eat: 1-2 cups of fresh fruits and vegetables each day. 3-4 servings of whole grains each day. Avoiding foods and beverages that have added sugars. Eating fish that contain healthy fats (omega-3 fatty acids), such as mackerel or salmon.  If you need help putting together a healthy eating plan, try the DASH diet. This diet is high in fruits, vegetables, and whole grains. It is low in sodium, red meat, and added sugars. DASH stands for Dietary Approaches to Stop Hypertension. Lifestyle changes Lose weight if you are overweight. Losing just 3?5% of your body weight can help prevent or control hypertension. For example, if your present weight is 200 lb (91 kg), a loss of 3-5% of your weight means losing 6-10 lb (2.7-4.5 kg). Ask your health care provider to help you with a diet and exercise plan to safely lose weight. Other recommendations usually include: Get enough exercise. Do at least 150 minutes of moderate-intensity exercise each week. You could do this in short exercise sessions several times a day, or you could do longer exercise sessions a few times a week. For example, you could take a brisk 10-minute walk or bike ride, 3 times a day, for 5 days a week. Find ways to reduce stress, such as exercising, meditating, listening to music, or taking a yoga class. If you need help reducing stress, ask your health care  provider. Do not use any products that contain nicotine or tobacco, such as cigarettes, e-cigarettes, and chewing tobacco. If you need help quitting, ask your health care provider. Chemicals in tobacco and nicotine products raise your blood pressure each time you use them. If you need help quitting, ask your health care provider. Learn how to check your blood pressure at home. Make sure that you know your personal target blood pressure, as told by your health care provider. Try to sleep 7-9 hours per night.  Alcohol use Do not drink alcohol if: Your health care provider tells you not to drink. You are pregnant, may be pregnant, or are planning to become pregnant. If you drink alcohol: Limit how much you use to: 0-1 drink a day for women. 0-2 drinks a day for men. Be aware of how much alcohol is in your drink. In the [place] one drink equals one 12 oz bottle of beer (355 mL), one 5 oz glass of wine (148 mL), or one 1 oz glass of hard liquor (44 mL). Medicines In addition to diet and lifestyle changes, your health care provider may recommend medicines to help lower your blood pressure. In general: You may need to try a few different medicines to find what works best for you. You may need to take more than one medicine. Take over-the-counter and prescription medicines only as told by your health care provider. Questions to ask your health care provider What is my blood pressure goal? How can I lower my risk for high blood pressure? How should I monitor my blood pressure at home? Where to find support Your health care provider can help you prevent hypertension and help you keep your blood pressure at a healthy level. Your local hospital or your community may also provide support services and prevention programs. The American Heart Association offers an online support network at supportnetwork.heart.org Where to find more information Learn more about hypertension from: National Heart, Lung, and  Blood Institute: PopSteam.is Centers for Disease Control and Prevention: FootballExhibition.com.br American Academy of Family Physicians: familydoctor.org Learn more about the DASH diet from: National Heart, Lung, and Blood Institute: PopSteam.is Contact a health care provider if: You think you are having a reaction to medicines you have taken. You have recurrent headaches or feel dizzy. You have swelling in your ankles. You have trouble with your vision. Get help  right away if: You have sudden, severe chest, back, or abdominal pain or discomfort. You have shortness of breath. You have a sudden, severe headache. These symptoms may represent a serious problem that is an emergency. Do not wait to see if the symptoms will go away. Get medical help right away. Call your local emergency services (911 in the [place] Do not drive yourself to the hospital.  Summary Hypertension often does not cause any symptoms until blood pressure is very high. It is important to get your blood pressure checked regularly. Diet and lifestyle changes are important steps in preventing hypertension. By keeping your blood pressure in a healthy range, you may prevent complications like heart attack, heart failure, stroke, and kidney failure. Work with your health care provider to make a hypertension prevention plan that works for you. This information is not intended to replace advice given to you by your health care provider. Make sure you discuss any questions you have with your health care provider. Document Revised: 10/24/2019 Document Reviewed: 10/24/2019 Elsevier Patient Education  2022 [employer] Inc.  Document Revised: 03/03/2021 Document Reviewed: 03/03/2021 Elsevier Patient Education  2022 [employer]. Blood Pressure Record Sheet To take your blood pressure, you will need a blood pressure machine. You can buy a blood pressure machine (blood pressure monitor) at your clinic, drug store, or online. When choosing  one, consider: An automatic monitor that has an arm cuff. A cuff that wraps snugly around your upper arm. You should be able to fit only one finger between your arm and the cuff. A device that stores blood pressure reading results. Do not choose a monitor that measures your blood pressure from your wrist or finger. Follow your health care provider's instructions for how to take your blood pressure. To use this form: Get one reading in the morning (a.m.) before you take any medicines. Get one reading in the evening (p.m.) before supper. Take at least 2 readings with each blood pressure check. This makes sure the results are correct. Wait 1-2 minutes between measurements. Write down the results in the spaces on this form. Repeat this once a week, or as told by your health care provider. Make a follow-up appointment with your health care provider to discuss the results. Blood pressure log Date: _______________________ a.m. _____________________(1st reading) _____________________(2nd reading) p.m. _____________________(1st reading) _____________________(2nd reading) Date: _______________________ a.m. _____________________(1st reading) _____________________(2nd reading) p.m. _____________________(1st reading) _____________________(2nd reading) Date: _______________________ a.m. _____________________(1st reading) _____________________(2nd reading) p.m. _____________________(1st reading) _____________________(2nd reading) Date: _______________________ a.m. _____________________(1st reading) _____________________(2nd reading) p.m. _____________________(1st reading) _____________________(2nd reading) Date: _______________________ a.m. _____________________(1st reading) _____________________(2nd reading) p.m. _____________________(1st reading) _____________________(2nd reading) This information is not intended to replace advice given to you by your health care provider. Make sure you discuss any  questions you have with your health care provider. Document Revised: 03/13/2020 Document Reviewed: 03/13/2020 Elsevier Patient Education  2022 [employer].

## 2021-09-09 ENCOUNTER — Encounter: Payer: Self-pay | Admitting: Family Medicine

## 2021-09-09 ENCOUNTER — Ambulatory Visit (INDEPENDENT_AMBULATORY_CARE_PROVIDER_SITE_OTHER): Payer: PPO | Admitting: Family Medicine

## 2021-09-09 ENCOUNTER — Other Ambulatory Visit: Payer: Self-pay

## 2021-09-09 ENCOUNTER — Ambulatory Visit (INDEPENDENT_AMBULATORY_CARE_PROVIDER_SITE_OTHER): Payer: PPO

## 2021-09-09 VITALS — BP 164/80 | HR 69 | Temp 97.6°F | Resp 16 | Ht 72.0 in | Wt 235.0 lb

## 2021-09-09 DIAGNOSIS — Z23 Encounter for immunization: Secondary | ICD-10-CM

## 2021-09-09 DIAGNOSIS — I10 Essential (primary) hypertension: Secondary | ICD-10-CM

## 2021-09-09 DIAGNOSIS — R600 Localized edema: Secondary | ICD-10-CM | POA: Insufficient documentation

## 2021-09-09 MED ORDER — HYDROCHLOROTHIAZIDE 25 MG PO TABS: 25.0000 mg | ORAL_TABLET | Freq: Every day | ORAL | 0 refills | Status: DC

## 2021-09-09 NOTE — Progress Notes (Signed)
Acute Office Visit  Subjective:    Patient ID: Jesse [name]., male    DOB: 1941-06-23, 80 y.o.   MRN: [id number]  Chief Complaint  Patient presents with   Hypertension     HPI Patient is in today for High blood pressure at home. He has been taking his blood pressure and the range is 190's systolic and 80's diastolic. He noticed gain weight and loss weight suddenly and headaches. He denied chest pain, weakness, dizziness. Patient takes propranolol 20 mg twice a day. This has been going on pry a month, but is getting worse. Headaches have been bad for a whiele.    Past Medical History:  Diagnosis Date   Arthritis    Degenerative lumbar spinal stenosis    Fatty liver    GERD (gastroesophageal reflux disease)    Headache    History of kidney stones    Hyperlipidemia    Hypertension    Mastodynia    Migraine without aura and without status migrainosus, not intractable    Neuritis or radiculitis due to rupture of lumbar intervertebral disc 07/15/2018   Radiculopathy of thoracolumbar region 07/15/2018   Radiculopathy of thoracolumbar region 07/15/2018    Past Surgical History:  Procedure Laterality Date   ANTERIOR LATERAL LUMBAR FUSION WITH PERCUTANEOUS SCREW 2 LEVEL N/A 07/13/2018   Procedure: ANTERIOR LATERAL LUMBAR FUSION L2-4;  Surgeon: Venita Lick, MD;  Location: [facility] OR;  Service: Orthopedics;  Laterality: N/A;  4 hrs   CHOLECYSTECTOMY  1999   GALLBLADDER SURGERY  2016   NECK SURGERY  1995   Ruptured disc repair   SHOULDER SURGERY Left 2005   SHOULDER SURGERY Right 2016   THROAT SURGERY     stretching    Family History  Problem Relation Age of Onset   Hypertension Mother    Cancer Father    Diabetes Brother    Migraines Neg Hx     Social History   Socioeconomic History   Marital status: Married    Spouse name: Darel Hong   Number of children: 4   Years of education: 12+   Highest education level: Not on file  Occupational History   Occupation: Retired   Tobacco Use   Smoking status: Former    Years: 15.00    Types: Cigarettes    Quit date: 1980    Years since quitting: 42.7   Smokeless tobacco: Never   Tobacco comments:    12.2.19-quit at least 40 years ago   Psychologist, educational Use   Vaping Use: Never used  Substance and Sexual Activity   Alcohol use: No   Drug use: No   Sexual activity: Not Currently  Other Topics Concern   Not on file  Social History Narrative   Lives with wife   Caffeine use: Drinks decaf coffee and tea   Writes right-handed, eats and shaves left-handed   Social Determinants of Health   Financial Resource Strain: Not on file  Food Insecurity: Not on file  Transportation Needs: Not on file  Physical Activity: Not on file  Stress: Not on file  Social Connections: Not on file  Intimate Partner Violence: Not on file    Outpatient Medications Prior to Visit  Medication Sig Dispense Refill   acetaminophen (TYLENOL) 325 MG tablet Take 2 tablets (650 mg total) by mouth every 4 (four) hours as needed for mild pain, fever or headache.     Ascorbic Acid (VITAMIN C) 1000 MG tablet Take 1,000 mg by mouth daily.  cholecalciferol (VITAMIN D3) 25 MCG (1000 UNIT) tablet Take 2,000 Units by mouth daily.     fluticasone (FLONASE) 50 MCG/ACT nasal spray Place 2 sprays into both nostrils daily.     mupirocin ointment (BACTROBAN) 2 % Apply 1 application topically 2 (two) times daily. 22 g 0   omeprazole (PRILOSEC) 20 MG capsule Take 20 mg by mouth daily before breakfast.      propranolol (INDERAL) 20 MG tablet TAKE 1 TABLET BY MOUTH TWICE A DAY 180 tablet 1   Zinc Sulfate (ZINC-220 PO) Take 1 capsule by mouth daily.      neomycin-polymyxin b-dexamethasone (MAXITROL) 3.5-10000-0.1 SUSP Instill 2 drops left ear twice daily for 5 days 5 mL 0   No facility-administered medications prior to visit.    Allergies  Allergen Reactions   Penicillins Rash    Review of Systems  Constitutional:  Positive for fatigue and unexpected  weight change. Negative for chills and fever.  HENT:  Negative for congestion, ear pain, sinus pain and sore throat.   Cardiovascular:  Negative for chest pain and palpitations.  Gastrointestinal:  Negative for abdominal pain, blood in stool, constipation, diarrhea, nausea and vomiting.  Endocrine: Negative for polydipsia.  Genitourinary:  Negative for dysuria and frequency.  Musculoskeletal:  Negative for back pain.  Skin:  Negative for rash.  Neurological:  Positive for headaches.      Objective:    Physical Exam  BP (!) 164/80   Pulse 69   Temp 97.6 F (36.4 C)   Resp 16   Ht 6' (1.829 m)   Wt 235 lb (106.6 kg)   SpO2 95%   BMI 31.87 kg/m  Wt Readings from Last 3 Encounters:  09/09/21 235 lb (106.6 kg)  09/02/21 232 lb (105.2 kg)  05/08/21 225 lb (102.1 kg)    Health Maintenance Due  Topic Date Due   Zoster Vaccines- Shingrix (1 of 2) Never done   INFLUENZA VACCINE  07/07/2021   COVID-19 Vaccine (4 - Booster for Pfizer series) 08/01/2021    There are no preventive care reminders to display for this patient.   No results found for: TSH Lab Results  Component Value Date   WBC 5.8 01/02/2021   HGB 15.8 01/02/2021   HCT 47.1 01/02/2021   MCV 87 01/02/2021   PLT 162 01/02/2021   Lab Results  Component Value Date   NA 143 01/02/2021   K 4.8 01/02/2021   CO2 26 01/02/2021   GLUCOSE 104 (H) 01/02/2021   BUN 12 01/02/2021   CREATININE 0.98 01/02/2021   BILITOT 0.4 01/02/2021   ALKPHOS 98 01/02/2021   AST 28 01/02/2021   ALT 42 01/02/2021   PROT 6.9 01/02/2021   ALBUMIN 4.3 01/02/2021   CALCIUM 9.8 01/02/2021   ANIONGAP 7 09/03/2020   Lab Results  Component Value Date   CHOL 164 01/02/2021   Lab Results  Component Value Date   HDL 42 01/02/2021   Lab Results  Component Value Date   LDLCALC 99 01/02/2021   Lab Results  Component Value Date   TRIG 126 01/02/2021   Lab Results  Component Value Date   CHOLHDL 3.9 01/02/2021   Lab Results   Component Value Date   HGBA1C 5.6 01/02/2021       Assessment & Plan:   Problem List Items Addressed This Visit       Cardiovascular and Mediastinum   Essential hypertension - Primary    START HCTZ 25 MG ONCE DAILY.  CONTINUE PROPRANOLOL 20 MG ONE TWICE A DAY.      Relevant Medications   hydrochlorothiazide (HYDRODIURIL) 25 MG tablet   Other Relevant Orders   CBC with Differential/Platelet   Comprehensive metabolic panel   TSH     Other   Pedal edema    START HCTZ 25 MG ONCE DAILY.      Meds ordered this encounter  Medications   hydrochlorothiazide (HYDRODIURIL) 25 MG tablet    Sig: Take 1 tablet (25 mg total) by mouth daily.    Dispense:  30 tablet    Refill:  0     Orders Placed This Encounter  Procedures   CBC with Differential/Platelet   Comprehensive metabolic panel   TSH      Follow-up: Return in about 4 weeks (around 10/07/2021) for chronic fasting.  An After Visit Summary was printed and given to the patient.  Blane Ohara, MD [facility] [phone]

## 2021-09-09 NOTE — Assessment & Plan Note (Signed)
START HCTZ 25 MG ONCE DAILY.   CONTINUE PROPRANOLOL 20 MG ONE TWICE A DAY.

## 2021-09-09 NOTE — Progress Notes (Signed)
   [id number]  Name:  Jesse [name].    MRN: [id number] DOB: 1941-03-17  09/09/2021  Mr. Jesse [name] was observed post Covid-19 immunization for 15 minutes without incident. He was provided with Vaccine Information Sheet and instruction to access the V-Safe system.   Mr. Jesse [name] was instructed to call 911 with any severe reactions post vaccine: Difficulty breathing  Swelling of face and throat  A fast heartbeat  A bad rash all over body  Dizziness and weakness

## 2021-09-09 NOTE — Assessment & Plan Note (Signed)
START HCTZ 25 MG ONCE DAILY.

## 2021-09-10 ENCOUNTER — Ambulatory Visit: Payer: PPO | Admitting: Nurse Practitioner

## 2021-10-01 ENCOUNTER — Other Ambulatory Visit: Payer: Self-pay | Admitting: Family Medicine

## 2021-10-01 DIAGNOSIS — I10 Essential (primary) hypertension: Secondary | ICD-10-CM

## 2021-10-14 ENCOUNTER — Other Ambulatory Visit: Payer: Self-pay

## 2021-10-14 ENCOUNTER — Encounter: Payer: Self-pay | Admitting: Family Medicine

## 2021-10-14 ENCOUNTER — Ambulatory Visit (INDEPENDENT_AMBULATORY_CARE_PROVIDER_SITE_OTHER): Payer: PPO | Admitting: Family Medicine

## 2021-10-14 VITALS — BP 142/62 | HR 74 | Temp 97.4°F | Ht 72.0 in | Wt 230.6 lb

## 2021-10-14 DIAGNOSIS — J3089 Other allergic rhinitis: Secondary | ICD-10-CM | POA: Diagnosis not present

## 2021-10-14 DIAGNOSIS — G43009 Migraine without aura, not intractable, without status migrainosus: Secondary | ICD-10-CM | POA: Diagnosis not present

## 2021-10-14 DIAGNOSIS — I10 Essential (primary) hypertension: Secondary | ICD-10-CM | POA: Diagnosis not present

## 2021-10-14 DIAGNOSIS — J309 Allergic rhinitis, unspecified: Secondary | ICD-10-CM | POA: Insufficient documentation

## 2021-10-14 DIAGNOSIS — J0181 Other acute recurrent sinusitis: Secondary | ICD-10-CM | POA: Diagnosis not present

## 2021-10-14 HISTORY — DX: Allergic rhinitis, unspecified: J30.9

## 2021-10-14 MED ORDER — HYDROCHLOROTHIAZIDE 25 MG PO TABS: 25.0000 mg | ORAL_TABLET | Freq: Every day | ORAL | 1 refills | Status: DC

## 2021-10-14 MED ORDER — MOXIFLOXACIN HCL 400 MG PO TABS: 400.0000 mg | ORAL_TABLET | Freq: Every day | ORAL | 0 refills | Status: DC

## 2021-10-14 MED ORDER — VERAPAMIL HCL ER 120 MG PO TBCR: 120.0000 mg | EXTENDED_RELEASE_TABLET | Freq: Every day | ORAL | 2 refills | Status: DC

## 2021-10-14 NOTE — Patient Instructions (Signed)
Sinusitis: - will treat with Avelox 400 mg daily x 7d - discussed symptomatic management (flonase, mucinex), natural course, and return precautions   Hypertension: Continue hydrochlorothiazide 25 mg once daily.  Start Calan SR 120 mg once daily in am Remain off propranolol.

## 2021-10-14 NOTE — Assessment & Plan Note (Signed)
Start Calan SR 120 mg once daily in AM. Continue HCTZ 25 mg once daily. Remain off propranolol. Follow-up in 2 weeks for blood pressure check.  Nursing discussed appropriate blood pressure cuff size.

## 2021-10-14 NOTE — Assessment & Plan Note (Signed)
Avelox 400 mg once daily for 7 days.

## 2021-10-14 NOTE — Progress Notes (Signed)
Subjective:  Patient ID: Jesse Heading., male    DOB: 01-06-1941  Age: 80 y.o. MRN: [id number]  Chief Complaint  Patient presents with   Pedal Edema   Sinusitis    HPI Hypertension: started on hctz 25 mg once daily. Pt misunderstood and had stopped his propranolol 20 mg one po bid. Edema has improved. Propranolol was also given for his headaches. He has not noticed any worsening of his headaches since discontinuing propranolol.  Sinus drainage, cough, sneezing, and sweats at nights, sinus headaches. Givne zpack on 09/02/2021 which helped, but worsening again. Taking flonase, mucinex. Sinus pain in frontal area.       Current Outpatient Medications on File Prior to Visit  Medication Sig Dispense Refill   acetaminophen (TYLENOL) 325 MG tablet Take 2 tablets (650 mg total) by mouth every 4 (four) hours as needed for mild pain, fever or headache.     Ascorbic Acid (VITAMIN C) 1000 MG tablet Take 1,000 mg by mouth daily.     cholecalciferol (VITAMIN D3) 25 MCG (1000 UNIT) tablet Take 2,000 Units by mouth daily.     Fexofenadine HCl (ALLERGY 24-HR PO) Take by mouth.     fluticasone (FLONASE) 50 MCG/ACT nasal spray Place 2 sprays into both nostrils daily.     mupirocin ointment (BACTROBAN) 2 % Apply 1 application topically 2 (two) times daily. 22 g 0   omeprazole (PRILOSEC) 20 MG capsule Take 20 mg by mouth daily before breakfast.      Zinc Sulfate (ZINC-220 PO) Take 1 capsule by mouth daily.      No current facility-administered medications on file prior to visit.   Past Medical History:  Diagnosis Date   Arthritis    Degenerative lumbar spinal stenosis    Fatty liver    GERD (gastroesophageal reflux disease)    Headache    History of kidney stones    Hyperlipidemia    Hypertension    Mastodynia    Migraine without aura and without status migrainosus, not intractable    Neuritis or radiculitis due to rupture of lumbar intervertebral disc 07/15/2018   Radiculopathy of  thoracolumbar region 07/15/2018   Radiculopathy of thoracolumbar region 07/15/2018   Past Surgical History:  Procedure Laterality Date   ANTERIOR LATERAL LUMBAR FUSION WITH PERCUTANEOUS SCREW 2 LEVEL N/A 07/13/2018   Procedure: ANTERIOR LATERAL LUMBAR FUSION L2-4;  Surgeon: Venita Lick, MD;  Location: [facility] OR;  Service: Orthopedics;  Laterality: N/A;  4 hrs   CHOLECYSTECTOMY  1999   GALLBLADDER SURGERY  2016   NECK SURGERY  1995   Ruptured disc repair   SHOULDER SURGERY Left 2005   SHOULDER SURGERY Right 2016   THROAT SURGERY     stretching    Family History  Problem Relation Age of Onset   Hypertension Mother    Cancer Father    Diabetes Brother    Migraines Neg Hx    Social History   Socioeconomic History   Marital status: Married    Spouse name: Darel Hong   Number of children: 4   Years of education: 12+   Highest education level: Not on file  Occupational History   Occupation: Retired  Tobacco Use   Smoking status: Former    Years: 15.00    Types: Cigarettes    Quit date: 1980    Years since quitting: 42.8   Smokeless tobacco: Never   Tobacco comments:    12.2.19-quit at least 40 years ago   Advertising account planner  Vaping Use: Never used  Substance and Sexual Activity   Alcohol use: No   Drug use: No   Sexual activity: Not Currently  Other Topics Concern   Not on file  Social History Narrative   Lives with wife   Caffeine use: Drinks decaf coffee and tea   Writes right-handed, eats and shaves left-handed   Social Determinants of Health   Financial Resource Strain: Not on file  Food Insecurity: Not on file  Transportation Needs: Not on file  Physical Activity: Not on file  Stress: Not on file  Social Connections: Not on file    Review of Systems  Constitutional:  Negative for appetite change, fatigue and fever.  HENT:  Positive for congestion (Head), postnasal drip, sinus pressure and sneezing. Negative for ear pain and sore throat.   Eyes:  Positive for visual  disturbance (vision changes).  Respiratory:  Negative for cough and shortness of breath.   Cardiovascular:  Negative for chest pain and leg swelling.  Gastrointestinal:  Negative for abdominal pain, constipation, diarrhea, nausea and vomiting.  Endocrine: Positive for polydipsia.  Genitourinary:  Positive for urgency. Negative for dysuria and frequency.  Musculoskeletal:  Positive for arthralgias, back pain and myalgias.  Neurological:  Positive for headaches. Negative for dizziness.  Psychiatric/Behavioral:  Negative for dysphoric mood. The patient is not nervous/anxious.     Objective:  BP (!) 142/62 (BP Location: Right Arm, Patient Position: Sitting)   Pulse 74   Temp (!) 97.4 F (36.3 C) (Temporal)   Ht 6' (1.829 m)   Wt 230 lb 9.6 oz (104.6 kg)   SpO2 97%   BMI 31.27 kg/m   BP/Weight 10/14/2021 09/09/2021 09/02/2021  Systolic BP 142 164 152  Diastolic BP 62 80 78  Wt. (Lbs) 230.6 235 232  BMI 31.27 31.87 31.46    Physical Exam Constitutional:      Appearance: Normal appearance.  HENT:     Right Ear: Tympanic membrane, ear canal and external ear normal.     Left Ear: Tympanic membrane, ear canal and external ear normal.     Nose: Congestion present. No rhinorrhea.     Comments: Turbinates erythematous and edematous.     Mouth/Throat:     Mouth: Mucous membranes are moist.     Pharynx: No oropharyngeal exudate or posterior oropharyngeal erythema.  Cardiovascular:     Rate and Rhythm: Normal rate and regular rhythm.     Heart sounds: Normal heart sounds.  Pulmonary:     Effort: Pulmonary effort is normal. No respiratory distress.     Breath sounds: Normal breath sounds. No wheezing, rhonchi or rales.  Lymphadenopathy:     Cervical: No cervical adenopathy.  Neurological:     Mental Status: He is alert.  Psychiatric:        Mood and Affect: Mood normal.        Behavior: Behavior normal.    Diabetic Foot Exam - Simple   No data filed      Lab Results   Component Value Date   WBC 5.8 01/02/2021   HGB 15.8 01/02/2021   HCT 47.1 01/02/2021   PLT 162 01/02/2021   GLUCOSE 104 (H) 01/02/2021   CHOL 164 01/02/2021   TRIG 126 01/02/2021   HDL 42 01/02/2021   LDLCALC 99 01/02/2021   ALT 42 01/02/2021   AST 28 01/02/2021   NA 143 01/02/2021   K 4.8 01/02/2021   CL 105 01/02/2021   CREATININE 0.98 01/02/2021   BUN  12 01/02/2021   CO2 26 01/02/2021   INR 1.6 (H) 09/02/2020   HGBA1C 5.6 01/02/2021      Assessment & Plan:   Problem List Items Addressed This Visit       Cardiovascular and Mediastinum   Migraine without aura and without status migrainosus, not intractable    Start Calan SR 120 mg once daily in AM.      Relevant Medications   verapamil (CALAN SR) 120 MG CR tablet   hydrochlorothiazide (HYDRODIURIL) 25 MG tablet   Essential hypertension    Start Calan SR 120 mg once daily in AM. Continue HCTZ 25 mg once daily. Remain off propranolol. Follow-up in 2 weeks for blood pressure check.  Nursing discussed appropriate blood pressure cuff size.      Relevant Medications   verapamil (CALAN SR) 120 MG CR tablet   hydrochlorothiazide (HYDRODIURIL) 25 MG tablet     Respiratory   Allergic rhinitis due to allergen    Continue to use nasal saline rinse.  Continue Flonase.  Recommend Mucinex.  Avoid decongestants.      Other acute recurrent sinusitis - Primary    Avelox 400 mg once daily for 7 days.      Relevant Medications   Fexofenadine HCl (ALLERGY 24-HR PO)   moxifloxacin (AVELOX) 400 MG tablet  .  Meds ordered this encounter  Medications   verapamil (CALAN SR) 120 MG CR tablet    Sig: Take 1 tablet (120 mg total) by mouth at bedtime.    Dispense:  30 tablet    Refill:  2   moxifloxacin (AVELOX) 400 MG tablet    Sig: Take 1 tablet (400 mg total) by mouth daily.    Dispense:  7 tablet    Refill:  0   hydrochlorothiazide (HYDRODIURIL) 25 MG tablet    Sig: Take 1 tablet (25 mg total) by mouth daily.     Dispense:  90 tablet    Refill:  1    No orders of the defined types were placed in this encounter.    Follow-up: Return in about 3 weeks (around 11/04/2021) for Nurse visit BP Check. , chronic fasting after January 03 2022.  An After Visit Summary was printed and given to the patient.   I,Lauren M Auman,acting as a scribe for Blane Ohara, MD.,have documented all relevant documentation on the behalf of Blane Ohara, MD,as directed by  Blane Ohara, MD while in the presence of Blane Ohara, MD.    Blane Ohara, MD [facility] [phone]

## 2021-10-14 NOTE — Assessment & Plan Note (Signed)
Continue to use nasal saline rinse.  Continue Flonase.  Recommend Mucinex.  Avoid decongestants.

## 2021-10-14 NOTE — Assessment & Plan Note (Signed)
Start Calan SR 120 mg once daily in AM.

## 2021-10-25 ENCOUNTER — Other Ambulatory Visit: Payer: Self-pay | Admitting: Family Medicine

## 2021-10-25 DIAGNOSIS — I10 Essential (primary) hypertension: Secondary | ICD-10-CM

## 2021-11-04 ENCOUNTER — Other Ambulatory Visit: Payer: Self-pay

## 2021-11-04 ENCOUNTER — Ambulatory Visit: Payer: PPO

## 2021-11-04 ENCOUNTER — Ambulatory Visit (INDEPENDENT_AMBULATORY_CARE_PROVIDER_SITE_OTHER): Payer: PPO | Admitting: Family Medicine

## 2021-11-04 VITALS — BP 130/68 | HR 80 | Temp 97.5°F | Resp 18 | Wt 232.0 lb

## 2021-11-04 VITALS — BP 132/56

## 2021-11-04 DIAGNOSIS — M25512 Pain in left shoulder: Secondary | ICD-10-CM

## 2021-11-04 DIAGNOSIS — G8929 Other chronic pain: Secondary | ICD-10-CM

## 2021-11-04 DIAGNOSIS — I10 Essential (primary) hypertension: Secondary | ICD-10-CM

## 2021-11-04 DIAGNOSIS — M7542 Impingement syndrome of left shoulder: Secondary | ICD-10-CM

## 2021-11-04 NOTE — Progress Notes (Signed)
Patient in office for BP check. BP in office 132/56.He has been taking HCTZ 25 mg. Will route note to provider in case of changes.   Lorita Officer, [place] 11/04/21 10:21 AM

## 2021-11-04 NOTE — Progress Notes (Signed)
Acute Office Visit  Subjective:    Patient ID: Jesse Heading., male    DOB: 1941/08/06, 80 y.o.   MRN: [id number]  Chief Complaint  Patient presents with   Shoulder Pain    left    HPI: Patient is in today for LEFT shoulder pain x months. Pt has been trying to take it easy. Sometime pain radiates down arm. Ibuprofen helps until wears off. No known injury.   Past Medical History:  Diagnosis Date   Arthritis    Degenerative lumbar spinal stenosis    Fatty liver    GERD (gastroesophageal reflux disease)    Headache    History of kidney stones    Hyperlipidemia    Hypertension    Mastodynia    Migraine without aura and without status migrainosus, not intractable    Neuritis or radiculitis due to rupture of lumbar intervertebral disc 07/15/2018   Radiculopathy of thoracolumbar region 07/15/2018   Radiculopathy of thoracolumbar region 07/15/2018    Past Surgical History:  Procedure Laterality Date   ANTERIOR LATERAL LUMBAR FUSION WITH PERCUTANEOUS SCREW 2 LEVEL N/A 07/13/2018   Procedure: ANTERIOR LATERAL LUMBAR FUSION L2-4;  Surgeon: Venita Lick, MD;  Location: [facility] OR;  Service: Orthopedics;  Laterality: N/A;  4 hrs   CHOLECYSTECTOMY  1999   GALLBLADDER SURGERY  2016   NECK SURGERY  1995   Ruptured disc repair   SHOULDER SURGERY Left 2005   SHOULDER SURGERY Right 2016   THROAT SURGERY     stretching    Family History  Problem Relation Age of Onset   Hypertension Mother    Cancer Father    Diabetes Brother    Migraines Neg Hx     Social History   Socioeconomic History   Marital status: Married    Spouse name: Darel Hong   Number of children: 4   Years of education: 12+   Highest education level: Not on file  Occupational History   Occupation: Retired  Tobacco Use   Smoking status: Former    Years: 15.00    Types: Cigarettes    Quit date: 1980    Years since quitting: 42.9   Smokeless tobacco: Never   Tobacco comments:    12.2.19-quit at least 40 years  ago   Psychologist, educational Use   Vaping Use: Never used  Substance and Sexual Activity   Alcohol use: No   Drug use: No   Sexual activity: Not Currently  Other Topics Concern   Not on file  Social History Narrative   Lives with wife   Caffeine use: Drinks decaf coffee and tea   Writes right-handed, eats and shaves left-handed   Social Determinants of Health   Financial Resource Strain: Not on file  Food Insecurity: Not on file  Transportation Needs: Not on file  Physical Activity: Not on file  Stress: Not on file  Social Connections: Not on file  Intimate Partner Violence: Not on file    Outpatient Medications Prior to Visit  Medication Sig Dispense Refill   acetaminophen (TYLENOL) 325 MG tablet Take 2 tablets (650 mg total) by mouth every 4 (four) hours as needed for mild pain, fever or headache.     Ascorbic Acid (VITAMIN C) 1000 MG tablet Take 1,000 mg by mouth daily.     cholecalciferol (VITAMIN D3) 25 MCG (1000 UNIT) tablet Take 2,000 Units by mouth daily.     Fexofenadine HCl (ALLERGY 24-HR PO) Take by mouth.     fluticasone (FLONASE)  50 MCG/ACT nasal spray Place 2 sprays into both nostrils daily.     hydrochlorothiazide (HYDRODIURIL) 25 MG tablet Take 1 tablet (25 mg total) by mouth daily. 90 tablet 1   moxifloxacin (AVELOX) 400 MG tablet Take 1 tablet (400 mg total) by mouth daily. 7 tablet 0   mupirocin ointment (BACTROBAN) 2 % Apply 1 application topically 2 (two) times daily. 22 g 0   omeprazole (PRILOSEC) 20 MG capsule Take 20 mg by mouth daily before breakfast.      verapamil (CALAN SR) 120 MG CR tablet Take 1 tablet (120 mg total) by mouth at bedtime. 30 tablet 2   Zinc Sulfate (ZINC-220 PO) Take 1 capsule by mouth daily.      No facility-administered medications prior to visit.    Allergies  Allergen Reactions   Penicillins Rash    Review of Systems  Constitutional:  Negative for chills and fever.  HENT:  Negative for congestion.   Respiratory:  Negative for cough  and shortness of breath.   Cardiovascular:  Negative for chest pain and palpitations.  Musculoskeletal:  Positive for arthralgias (left shoulder pain).      Objective:    Physical Exam Vitals reviewed.  Constitutional:      Appearance: Normal appearance.  Musculoskeletal:        General: Tenderness (Left shoulder: anterior and superior tenderness. ROM limited. Unable to abduct beyon 100 degrees, fairly good external rotation. Significant discomfort with internal rotation. positive empty can. right shoulder normal) present.  Neurological:     Mental Status: He is alert.    BP 130/68   Pulse 80   Temp (!) 97.5 F (36.4 C)   Resp 18   Wt 232 lb (105.2 kg)   BMI 31.46 kg/m  Wt Readings from Last 3 Encounters:  11/04/21 232 lb (105.2 kg)  10/14/21 230 lb 9.6 oz (104.6 kg)  09/09/21 235 lb (106.6 kg)    Health Maintenance Due  Topic Date Due   Zoster Vaccines- Shingrix (1 of 2) Never done    There are no preventive care reminders to display for this patient.   No results found for: TSH Lab Results  Component Value Date   WBC 5.8 01/02/2021   HGB 15.8 01/02/2021   HCT 47.1 01/02/2021   MCV 87 01/02/2021   PLT 162 01/02/2021   Lab Results  Component Value Date   NA 143 01/02/2021   K 4.8 01/02/2021   CO2 26 01/02/2021   GLUCOSE 104 (H) 01/02/2021   BUN 12 01/02/2021   CREATININE 0.98 01/02/2021   BILITOT 0.4 01/02/2021   ALKPHOS 98 01/02/2021   AST 28 01/02/2021   ALT 42 01/02/2021   PROT 6.9 01/02/2021   ALBUMIN 4.3 01/02/2021   CALCIUM 9.8 01/02/2021   ANIONGAP 7 09/03/2020   Lab Results  Component Value Date   CHOL 164 01/02/2021   Lab Results  Component Value Date   HDL 42 01/02/2021   Lab Results  Component Value Date   LDLCALC 99 01/02/2021   Lab Results  Component Value Date   TRIG 126 01/02/2021   Lab Results  Component Value Date   CHOLHDL 3.9 01/02/2021   Lab Results  Component Value Date   HGBA1C 5.6 01/02/2021        Assessment & Plan:   Problem List Items Addressed This Visit       Musculoskeletal and Integument   Shoulder impingement syndrome, left    Risks were discussed including bleeding, infection,  increase in sugars if diabetic, atrophy at site of injection, and increased pain.  After consent was obtained, using sterile technique the left shoulder was prepped with alcohol.  The joint was entered posteriorly and Kenalog 40 mg and 5 ml plain Lidocaine was then injected and the needle withdrawn.  The procedure was well tolerated.   The patient is asked to continue to rest the joint for a few more days before resuming regular activities.  It may be more painful for the first 1-2 days.  Watch for fever, or increased swelling or persistent pain in the joint. Call or return to clinic prn if such symptoms occur or there is failure to improve as anticipated.  Ordered shoulder xray.  Anticipate ordering an mri and referring to orthopedics. Dr. Thomasena Edis.      Relevant Medications   triamcinolone acetonide (KENALOG-40) injection 40 mg (Start on 11/05/2021  7:45 PM)   Other Relevant Orders   MR Shoulder Left Wo Contrast   Ambulatory referral to Orthopedic Surgery     Other   Chronic left shoulder pain - Primary   Relevant Medications   triamcinolone acetonide (KENALOG-40) injection 40 mg (Start on 11/05/2021  7:45 PM)   Other Relevant Orders   DG Shoulder Left   MR Shoulder Left Wo Contrast   Ambulatory referral to Orthopedic Surgery   Meds ordered this encounter  Medications   triamcinolone acetonide (KENALOG-40) injection 40 mg    Orders Placed This Encounter  Procedures   DG Shoulder Left   MR Shoulder Left Wo Contrast   Ambulatory referral to Orthopedic Surgery     Follow-up: No follow-ups on file.  An After Visit Summary was printed and given to the patient.  Blane Ohara, MD [name] [phone]

## 2021-11-05 ENCOUNTER — Encounter: Payer: Self-pay | Admitting: Family Medicine

## 2021-11-05 DIAGNOSIS — M25512 Pain in left shoulder: Secondary | ICD-10-CM | POA: Diagnosis not present

## 2021-11-05 DIAGNOSIS — M7542 Impingement syndrome of left shoulder: Secondary | ICD-10-CM | POA: Insufficient documentation

## 2021-11-05 DIAGNOSIS — M19012 Primary osteoarthritis, left shoulder: Secondary | ICD-10-CM | POA: Insufficient documentation

## 2021-11-05 DIAGNOSIS — G8929 Other chronic pain: Secondary | ICD-10-CM | POA: Diagnosis not present

## 2021-11-05 HISTORY — DX: Primary osteoarthritis, left shoulder: M19.012

## 2021-11-05 MED ORDER — TRIAMCINOLONE ACETONIDE 40 MG/ML IJ SUSP: 40.0000 mg | Freq: Once | INTRAMUSCULAR | Status: DC

## 2021-11-05 NOTE — Assessment & Plan Note (Signed)
Risks were discussed including bleeding, infection, increase in sugars if diabetic, atrophy at site of injection, and increased pain.  After consent was obtained, using sterile technique the left shoulder was prepped with alcohol.  The joint was entered posteriorly and Kenalog 40 mg and 5 ml plain Lidocaine was then injected and the needle withdrawn.  The procedure was well tolerated.   The patient is asked to continue to rest the joint for a few more days before resuming regular activities.  It may be more painful for the first 1-2 days.  Watch for fever, or increased swelling or persistent pain in the joint. Call or return to clinic prn if such symptoms occur or there is failure to improve as anticipated.  Ordered shoulder xray.  Anticipate ordering an mri and referring to orthopedics. Dr. Thomasena Edis.

## 2021-11-12 DIAGNOSIS — M25412 Effusion, left shoulder: Secondary | ICD-10-CM | POA: Diagnosis not present

## 2021-11-12 DIAGNOSIS — M19012 Primary osteoarthritis, left shoulder: Secondary | ICD-10-CM | POA: Diagnosis not present

## 2021-11-12 DIAGNOSIS — G8929 Other chronic pain: Secondary | ICD-10-CM | POA: Diagnosis not present

## 2021-11-12 DIAGNOSIS — M7542 Impingement syndrome of left shoulder: Secondary | ICD-10-CM | POA: Diagnosis not present

## 2021-11-12 DIAGNOSIS — M25512 Pain in left shoulder: Secondary | ICD-10-CM | POA: Diagnosis not present

## 2021-11-12 DIAGNOSIS — Z9889 Other specified postprocedural states: Secondary | ICD-10-CM | POA: Diagnosis not present

## 2021-11-13 ENCOUNTER — Ambulatory Visit (INDEPENDENT_AMBULATORY_CARE_PROVIDER_SITE_OTHER): Payer: PPO | Admitting: Family Medicine

## 2021-11-13 ENCOUNTER — Other Ambulatory Visit: Payer: Self-pay

## 2021-11-13 ENCOUNTER — Encounter: Payer: Self-pay | Admitting: Family Medicine

## 2021-11-13 VITALS — BP 132/70 | HR 64 | Temp 97.2°F | Resp 16 | Ht 72.0 in | Wt 227.0 lb

## 2021-11-13 DIAGNOSIS — R109 Unspecified abdominal pain: Secondary | ICD-10-CM | POA: Diagnosis not present

## 2021-11-13 DIAGNOSIS — K5732 Diverticulitis of large intestine without perforation or abscess without bleeding: Secondary | ICD-10-CM | POA: Diagnosis not present

## 2021-11-13 DIAGNOSIS — Z87442 Personal history of urinary calculi: Secondary | ICD-10-CM | POA: Diagnosis not present

## 2021-11-13 DIAGNOSIS — Z87891 Personal history of nicotine dependence: Secondary | ICD-10-CM | POA: Diagnosis not present

## 2021-11-13 DIAGNOSIS — K5792 Diverticulitis of intestine, part unspecified, without perforation or abscess without bleeding: Secondary | ICD-10-CM | POA: Diagnosis not present

## 2021-11-13 DIAGNOSIS — R10824 Left lower quadrant rebound abdominal tenderness: Secondary | ICD-10-CM | POA: Insufficient documentation

## 2021-11-13 LAB — POCT URINALYSIS DIP (CLINITEK)
Bilirubin, UA: NEGATIVE
Blood, UA: NEGATIVE
Glucose, UA: NEGATIVE mg/dL
Ketones, POC UA: NEGATIVE mg/dL
Leukocytes, UA: NEGATIVE
Nitrite, UA: NEGATIVE
Spec Grav, UA: 1.015 (ref 1.010–1.025)
Urobilinogen, UA: NEGATIVE E.U./dL — AB
pH, UA: 7.5 (ref 5.0–8.0)

## 2021-11-13 NOTE — Progress Notes (Signed)
Subjective:  Patient ID: Jesse Heading., male    DOB: Feb 27, 1941  Age: 80 y.o. MRN: [id number]  Chief Complaint  Patient presents with   Abdominal Pain    HPI 80 yo WM complaining of LLQ abdominal pain since Sunday.  It has been constant although waxes and wanes in intensity.  It is worse with movement.  Patient denies nausea, vomiting, diarrhea or constipation.  He did break out in a sweat last night.  He does not know if he actually had a temperature.  He denies bladder symptoms.  He rates the pain 6 out of 10.  Current Outpatient Medications on File Prior to Visit  Medication Sig Dispense Refill   acetaminophen (TYLENOL) 325 MG tablet Take 2 tablets (650 mg total) by mouth every 4 (four) hours as needed for mild pain, fever or headache.     Ascorbic Acid (VITAMIN C) 1000 MG tablet Take 1,000 mg by mouth daily.     cholecalciferol (VITAMIN D3) 25 MCG (1000 UNIT) tablet Take 2,000 Units by mouth daily.     Fexofenadine HCl (ALLERGY 24-HR PO) Take by mouth.     fluticasone (FLONASE) 50 MCG/ACT nasal spray Place 2 sprays into both nostrils daily.     hydrochlorothiazide (HYDRODIURIL) 25 MG tablet Take 1 tablet (25 mg total) by mouth daily. 90 tablet 1   moxifloxacin (AVELOX) 400 MG tablet Take 1 tablet (400 mg total) by mouth daily. 7 tablet 0   mupirocin ointment (BACTROBAN) 2 % Apply 1 application topically 2 (two) times daily. 22 g 0   omeprazole (PRILOSEC) 20 MG capsule Take 20 mg by mouth daily before breakfast.      verapamil (CALAN SR) 120 MG CR tablet Take 1 tablet (120 mg total) by mouth at bedtime. 30 tablet 2   Zinc Sulfate (ZINC-220 PO) Take 1 capsule by mouth daily.      Current Facility-Administered Medications on File Prior to Visit  Medication Dose Route Frequency Provider Last Rate Last Admin   triamcinolone acetonide (KENALOG-40) injection 40 mg  40 mg Intra-articular Once Tu Bayle, Fritzi Mandes, MD       Past Medical History:  Diagnosis Date   Arthritis     Degenerative lumbar spinal stenosis    Fatty liver    GERD (gastroesophageal reflux disease)    Headache    History of kidney stones    Hyperlipidemia    Hypertension    Mastodynia    Migraine without aura and without status migrainosus, not intractable    Neuritis or radiculitis due to rupture of lumbar intervertebral disc 07/15/2018   Radiculopathy of thoracolumbar region 07/15/2018   Radiculopathy of thoracolumbar region 07/15/2018   Past Surgical History:  Procedure Laterality Date   ANTERIOR LATERAL LUMBAR FUSION WITH PERCUTANEOUS SCREW 2 LEVEL N/A 07/13/2018   Procedure: ANTERIOR LATERAL LUMBAR FUSION L2-4;  Surgeon: Venita Lick, MD;  Location: [facility] OR;  Service: Orthopedics;  Laterality: N/A;  4 hrs   CHOLECYSTECTOMY  1999   GALLBLADDER SURGERY  2016   NECK SURGERY  1995   Ruptured disc repair   SHOULDER SURGERY Left 2005   SHOULDER SURGERY Right 2016   THROAT SURGERY     stretching    Family History  Problem Relation Age of Onset   Hypertension Mother    Cancer Father    Diabetes Brother    Migraines Neg Hx    Social History   Socioeconomic History   Marital status: Married    Spouse name:  Darel Hong   Number of children: 4   Years of education: 12+   Highest education level: Not on file  Occupational History   Occupation: Retired  Tobacco Use   Smoking status: Former    Years: 15.00    Types: Cigarettes    Quit date: 1980    Years since quitting: 42.9   Smokeless tobacco: Never   Tobacco comments:    12.2.19-quit at least 40 years ago   Vaping Use   Vaping Use: Never used  Substance and Sexual Activity   Alcohol use: No   Drug use: No   Sexual activity: Not Currently  Other Topics Concern   Not on file  Social History Narrative   Lives with wife   Caffeine use: Drinks decaf coffee and tea   Writes right-handed, eats and shaves left-handed   Social Determinants of Health   Financial Resource Strain: Not on file  Food Insecurity: Not on file   Transportation Needs: Not on file  Physical Activity: Not on file  Stress: Not on file  Social Connections: Not on file    Review of Systems  Constitutional:  Negative for fever.  HENT:  Negative for congestion.   Respiratory:  Negative for cough.   Gastrointestinal:  Positive for abdominal pain and constipation.  Musculoskeletal:  Negative for back pain.  Neurological:  Positive for headaches.    Objective:  BP 132/70   Pulse 64   Temp (!) 97.2 F (36.2 C)   Resp 16   Ht 6' (1.829 m)   Wt 227 lb (103 kg)   BMI 30.79 kg/m   BP/Weight 11/13/2021 11/04/2021 11/04/2021  Systolic BP 132 130 132  Diastolic BP 70 68 56  Wt. (Lbs) 227 232 -  BMI 30.79 31.46 -    Physical Exam Vitals reviewed.  Constitutional:      Appearance: Normal appearance. He is well-developed.  Neck:     Vascular: No carotid bruit.  Cardiovascular:     Rate and Rhythm: Normal rate and regular rhythm.     Pulses: Normal pulses.     Heart sounds: Normal heart sounds.  Pulmonary:     Effort: Pulmonary effort is normal.     Breath sounds: Normal breath sounds. No wheezing, rhonchi or rales.  Abdominal:     General: Bowel sounds are normal.     Palpations: Abdomen is soft.     Tenderness: There is abdominal tenderness in the left lower quadrant. There is rebound.     Hernia: There is no hernia in the left inguinal area or left femoral area.  Neurological:     Mental Status: He is alert and oriented to person, place, and time.  Psychiatric:        Mood and Affect: Mood normal.        Behavior: Behavior normal.    Diabetic Foot Exam - Simple   No data filed      Lab Results  Component Value Date   WBC 5.8 01/02/2021   HGB 15.8 01/02/2021   HCT 47.1 01/02/2021   PLT 162 01/02/2021   GLUCOSE 104 (H) 01/02/2021   CHOL 164 01/02/2021   TRIG 126 01/02/2021   HDL 42 01/02/2021   LDLCALC 99 01/02/2021   ALT 42 01/02/2021   AST 28 01/02/2021   NA 143 01/02/2021   K 4.8 01/02/2021   CL  105 01/02/2021   CREATININE 0.98 01/02/2021   BUN 12 01/02/2021   CO2 26 01/02/2021   INR  1.6 (H) 09/02/2020   HGBA1C 5.6 01/02/2021      Assessment & Plan:   Problem List Items Addressed This Visit       Other   Left lower quadrant abdominal tenderness with rebound tenderness - Primary    DDX: Diverticulitis, abscess, perforation.  Unable to send pt directy over for ct scan.  Refer to ED for immediate evaluation. Pt has rebound on exam.      Relevant Orders   POCT URINALYSIS DIP (CLINITEK) (Completed)  . Total time spent on today's visit was greater than 30 minutes, including both face-to-face time and nonface-to-face time personally spent on discussing further work-up, referrals to ED, and calling ED to coordinating emergency care.   Orders Placed This Encounter  Procedures   POCT URINALYSIS DIP (CLINITEK)     Follow-up: next week after care at hospital.  An After Visit Summary was printed and given to the patient.  Blane Ohara, MD [facility] [phone]

## 2021-11-13 NOTE — Assessment & Plan Note (Signed)
DDX: Diverticulitis, abscess, perforation.  Unable to send pt directy over for ct scan.  Refer to ED for immediate evaluation. Pt has rebound on exam.

## 2021-11-20 ENCOUNTER — Other Ambulatory Visit: Payer: Self-pay

## 2021-11-20 DIAGNOSIS — M7542 Impingement syndrome of left shoulder: Secondary | ICD-10-CM

## 2021-11-20 DIAGNOSIS — G8929 Other chronic pain: Secondary | ICD-10-CM

## 2021-12-15 ENCOUNTER — Encounter: Payer: Self-pay | Admitting: Family Medicine

## 2021-12-15 DIAGNOSIS — M19012 Primary osteoarthritis, left shoulder: Secondary | ICD-10-CM | POA: Diagnosis not present

## 2021-12-15 DIAGNOSIS — M25512 Pain in left shoulder: Secondary | ICD-10-CM | POA: Diagnosis not present

## 2022-01-05 NOTE — Progress Notes (Signed)
Subjective:  Patient ID: Jesse [name]., male    DOB: 06-05-1941  Age: 81 y.o. MRN: [id number]  Chief Complaint  Patient presents with   Prediabetes   Hypertension   Gastroesophageal Reflux    HPI Prediabetes: Patient is not taking any medication, but he is eating healthy and low carbs diet. Exercising.   Hypertension: He is taking HYDROCHLOROTHIAZIDE 25 mg daily, verapamil 120 mg at bedtime. 140-170/80s. Asymptomatic except feels nervous and will check it.   GERD: He takes Omeprazole 20 mg daily.  Current Outpatient Medications on File Prior to Visit  Medication Sig Dispense Refill   acetaminophen (TYLENOL) 325 MG tablet Take 2 tablets (650 mg total) by mouth every 4 (four) hours as needed for mild pain, fever or headache.     Ascorbic Acid (VITAMIN C) 1000 MG tablet Take 1,000 mg by mouth daily.     cholecalciferol (VITAMIN D3) 25 MCG (1000 UNIT) tablet Take 2,000 Units by mouth daily.     Fexofenadine HCl (ALLERGY 24-HR PO) Take by mouth.     fluticasone (FLONASE) 50 MCG/ACT nasal spray Place 2 sprays into both nostrils daily.     hydrochlorothiazide (HYDRODIURIL) 25 MG tablet Take 1 tablet (25 mg total) by mouth daily. 90 tablet 1   omeprazole (PRILOSEC) 20 MG capsule Take 20 mg by mouth daily before breakfast.      verapamil (CALAN SR) 120 MG CR tablet Take 1 tablet (120 mg total) by mouth at bedtime. 30 tablet 2   Zinc Sulfate (ZINC-220 PO) Take 1 capsule by mouth daily.      Current Facility-Administered Medications on File Prior to Visit  Medication Dose Route Frequency Provider Last Rate Last Admin   triamcinolone acetonide (KENALOG-40) injection 40 mg  40 mg Intra-articular Once CoxFritzi Mandes, MD       Past Medical History:  Diagnosis Date   Acute pulmonary embolism without acute cor pulmonale (HCC) 09/02/2020   Arthritis    Degenerative lumbar spinal stenosis    Fatty liver    GERD (gastroesophageal reflux disease)    Headache    History of kidney stones     Hyperlipidemia    Hypertension    Mastodynia    Migraine without aura and without status migrainosus, not intractable    Neuritis or radiculitis due to rupture of lumbar intervertebral disc 07/15/2018   Radiculopathy of thoracolumbar region 07/15/2018   Radiculopathy of thoracolumbar region 07/15/2018   Past Surgical History:  Procedure Laterality Date   ANTERIOR LATERAL LUMBAR FUSION WITH PERCUTANEOUS SCREW 2 LEVEL N/A 07/13/2018   Procedure: ANTERIOR LATERAL LUMBAR FUSION L2-4;  Surgeon: Venita Lick, MD;  Location: [facility] OR;  Service: Orthopedics;  Laterality: N/A;  4 hrs   CHOLECYSTECTOMY  1999   GALLBLADDER SURGERY  2016   NECK SURGERY  1995   Ruptured disc repair   SHOULDER SURGERY Left 2005   SHOULDER SURGERY Right 2016   THROAT SURGERY     stretching    Family History  Problem Relation Age of Onset   Hypertension Mother    Cancer Father    Diabetes Brother    Migraines Neg Hx    Social History   Socioeconomic History   Marital status: Married    Spouse name: Darel Hong   Number of children: 4   Years of education: 12+   Highest education level: Not on file  Occupational History   Occupation: Retired  Tobacco Use   Smoking status: Former    Years: 15.00  Types: Cigarettes    Quit date: 1980    Years since quitting: 43.1   Smokeless tobacco: Never   Tobacco comments:    12.2.19-quit at least 40 years ago   Vaping Use   Vaping Use: Never used  Substance and Sexual Activity   Alcohol use: No   Drug use: No   Sexual activity: Not Currently  Other Topics Concern   Not on file  Social History Narrative   Lives with wife   Caffeine use: Drinks decaf coffee and tea   Writes right-handed, eats and shaves left-handed   Social Determinants of Health   Financial Resource Strain: Not on file  Food Insecurity: Not on file  Transportation Needs: Not on file  Physical Activity: Not on file  Stress: Not on file  Social Connections: Not on file    Review of Systems   Constitutional:  Negative for chills, fatigue, fever and unexpected weight change.  HENT:  Negative for congestion, ear pain, sinus pain and sore throat.   Cardiovascular:  Negative for chest pain and palpitations.  Gastrointestinal:  Negative for abdominal pain, blood in stool, constipation, diarrhea, nausea and vomiting.  Endocrine: Positive for polyphagia and polyuria. Negative for polydipsia.  Genitourinary:  Positive for frequency. Negative for dysuria.  Musculoskeletal:  Positive for arthralgias (shoulder and knee pain. orthopedics says left shoulder OA severe. Only option is left shoulder replacement. Rt knee pain. no xrays done. It will awaken him in the middle of the night and he can manipulate the knee to resolve the pain.) and back pain.  Skin:  Negative for rash.  Neurological:  Positive for headaches (baseline.).  Psychiatric/Behavioral:  Negative for decreased concentration and dysphoric mood. The patient is not nervous/anxious.     Objective:  BP (!) 150/78    Pulse 76    Temp 97.6 F (36.4 C)    Resp 18    Ht 5' 10.9" (1.801 m)    Wt 227 lb (103 kg)    SpO2 98%    BMI 31.75 kg/m   BP/Weight 01/06/2022 11/13/2021 11/04/2021  Systolic BP 150 132 130  Diastolic BP 78 70 68  Wt. (Lbs) 227 227 232  BMI 31.75 30.79 31.46    Physical Exam Vitals reviewed.  Constitutional:      Appearance: Normal appearance.  Neck:     Vascular: No carotid bruit.  Cardiovascular:     Rate and Rhythm: Normal rate and regular rhythm.     Pulses: Normal pulses.     Heart sounds: Normal heart sounds.  Pulmonary:     Effort: Pulmonary effort is normal.     Breath sounds: Normal breath sounds. No wheezing, rhonchi or rales.  Abdominal:     General: Bowel sounds are normal.     Palpations: Abdomen is soft.     Tenderness: There is no abdominal tenderness.  Neurological:     Mental Status: He is alert.  Psychiatric:        Mood and Affect: Mood normal.        Behavior: Behavior normal.     Diabetic Foot Exam - Simple   No data filed      Lab Results  Component Value Date   WBC 5.8 01/02/2021   HGB 15.8 01/02/2021   HCT 47.1 01/02/2021   PLT 162 01/02/2021   GLUCOSE 104 (H) 01/02/2021   CHOL 164 01/02/2021   TRIG 126 01/02/2021   HDL 42 01/02/2021   LDLCALC 99 01/02/2021   ALT  42 01/02/2021   AST 28 01/02/2021   NA 143 01/02/2021   K 4.8 01/02/2021   CL 105 01/02/2021   CREATININE 0.98 01/02/2021   BUN 12 01/02/2021   CO2 26 01/02/2021   INR 1.6 (H) 09/02/2020   HGBA1C 5.6 01/02/2021      Assessment & Plan:   Problem List Items Addressed This Visit       Cardiovascular and Mediastinum   Migraine without aura and without status migrainosus, not intractable    Stable. Baseline.       Relevant Medications   valsartan (DIOVAN) 80 MG tablet   RESOLVED: Essential hypertension    Uncontrolled.  Add valsartan 80 mg daily. Continue HYDROCHLOROTHIAZIDE 25 mg daily, verapamil 120 mg at bedtime. Check bp daily  Continue to work on eating a healthy diet and exercise.  Labs drawn today.        Relevant Medications   valsartan (DIOVAN) 80 MG tablet   Other Relevant Orders   Comprehensive metabolic panel   Lipid panel   CBC with Differential/Platelet     Respiratory   Allergic rhinitis due to allergen    Continue allegra        Digestive   Gastroesophageal reflux disease without esophagitis    The current medical regimen is effective;  continue present plan and medications.on omeprazole         Nervous and Auditory   Impaired hearing    Has hearing aids        Musculoskeletal and Integument   Osteoarthritis of left shoulder    Patient has seen orthopedics. Recommended replacement. Patient is not ready for this yet.         Other   Prediabetes - Primary    Recommend continue to work on eating healthy diet and exercise.       Relevant Orders   Hemoglobin A1c  .  Meds ordered this encounter  Medications   valsartan  (DIOVAN) 80 MG tablet    Sig: Take 1 tablet (80 mg total) by mouth daily.    Dispense:  90 tablet    Refill:  0    Orders Placed This Encounter  Procedures   Comprehensive metabolic panel   Hemoglobin A1c   Lipid panel   CBC with Differential/Platelet     Follow-up: Return in about 4 weeks (around 02/03/2022) for HTN follow up.  An After Visit Summary was printed and given to the patient.  Blane Ohara, MD [facility] [phone]

## 2022-01-06 ENCOUNTER — Other Ambulatory Visit: Payer: Self-pay

## 2022-01-06 ENCOUNTER — Encounter: Payer: Self-pay | Admitting: Family Medicine

## 2022-01-06 ENCOUNTER — Ambulatory Visit (INDEPENDENT_AMBULATORY_CARE_PROVIDER_SITE_OTHER): Payer: PPO | Admitting: Family Medicine

## 2022-01-06 VITALS — BP 150/78 | HR 76 | Temp 97.6°F | Resp 18 | Ht 70.9 in | Wt 227.0 lb

## 2022-01-06 DIAGNOSIS — I10 Essential (primary) hypertension: Secondary | ICD-10-CM

## 2022-01-06 DIAGNOSIS — R7303 Prediabetes: Secondary | ICD-10-CM

## 2022-01-06 DIAGNOSIS — G43009 Migraine without aura, not intractable, without status migrainosus: Secondary | ICD-10-CM | POA: Diagnosis not present

## 2022-01-06 DIAGNOSIS — H918X3 Other specified hearing loss, bilateral: Secondary | ICD-10-CM | POA: Diagnosis not present

## 2022-01-06 DIAGNOSIS — M19012 Primary osteoarthritis, left shoulder: Secondary | ICD-10-CM | POA: Diagnosis not present

## 2022-01-06 DIAGNOSIS — K219 Gastro-esophageal reflux disease without esophagitis: Secondary | ICD-10-CM | POA: Diagnosis not present

## 2022-01-06 DIAGNOSIS — J3089 Other allergic rhinitis: Secondary | ICD-10-CM | POA: Diagnosis not present

## 2022-01-06 DIAGNOSIS — M7542 Impingement syndrome of left shoulder: Secondary | ICD-10-CM

## 2022-01-06 MED ORDER — VALSARTAN 80 MG PO TABS: 80.0000 mg | ORAL_TABLET | Freq: Every day | ORAL | 0 refills | Status: DC

## 2022-01-06 NOTE — Assessment & Plan Note (Signed)
Continue allegra  

## 2022-01-06 NOTE — Assessment & Plan Note (Signed)
Has hearing aids.

## 2022-01-06 NOTE — Assessment & Plan Note (Signed)
Stable. Baseline.

## 2022-01-06 NOTE — Patient Instructions (Addendum)
Check bp daily.  Start valsartan 80 mg once daily  Continue hydrochlorothiazide 25 mg daily.  Continue verapamil 120 mg once daily.

## 2022-01-06 NOTE — Assessment & Plan Note (Signed)
Patient has seen orthopedics. Recommended replacement. Patient is not ready for this yet.

## 2022-01-06 NOTE — Assessment & Plan Note (Signed)
The current medical regimen is effective;  continue present plan and medications.on omeprazole

## 2022-01-06 NOTE — Assessment & Plan Note (Signed)
Recommend continue to work on eating healthy diet and exercise.  

## 2022-01-06 NOTE — Assessment & Plan Note (Signed)
Uncontrolled.  Add valsartan 80 mg daily. Continue HYDROCHLOROTHIAZIDE 25 mg daily, verapamil 120 mg at bedtime. Check bp daily  Continue to work on eating a healthy diet and exercise.  Labs drawn today.

## 2022-01-07 LAB — CBC WITH DIFFERENTIAL/PLATELET
Basophils Absolute: 0 10*3/uL (ref 0.0–0.2)
Basos: 1 %
EOS (ABSOLUTE): 0.1 10*3/uL (ref 0.0–0.4)
Eos: 2 %
Hematocrit: 47.2 % (ref 37.5–51.0)
Hemoglobin: 16.1 g/dL (ref 13.0–17.7)
Immature Grans (Abs): 0 10*3/uL (ref 0.0–0.1)
Immature Granulocytes: 0 %
Lymphocytes Absolute: 1.3 10*3/uL (ref 0.7–3.1)
Lymphs: 23 %
MCH: 30 pg (ref 26.6–33.0)
MCHC: 34.1 g/dL (ref 31.5–35.7)
MCV: 88 fL (ref 79–97)
Monocytes Absolute: 0.5 10*3/uL (ref 0.1–0.9)
Monocytes: 9 %
Neutrophils Absolute: 3.8 10*3/uL (ref 1.4–7.0)
Neutrophils: 65 %
Platelets: 143 10*3/uL — ABNORMAL LOW (ref 150–450)
RBC: 5.37 x10E6/uL (ref 4.14–5.80)
RDW: 13.9 % (ref 11.6–15.4)
WBC: 5.8 10*3/uL (ref 3.4–10.8)

## 2022-01-07 LAB — COMPREHENSIVE METABOLIC PANEL
ALT: 59 IU/L — ABNORMAL HIGH (ref 0–44)
AST: 35 IU/L (ref 0–40)
Albumin/Globulin Ratio: 1.7 (ref 1.2–2.2)
Albumin: 4.3 g/dL (ref 3.7–4.7)
Alkaline Phosphatase: 79 IU/L (ref 44–121)
BUN/Creatinine Ratio: 13 (ref 10–24)
BUN: 13 mg/dL (ref 8–27)
Bilirubin Total: 0.6 mg/dL (ref 0.0–1.2)
CO2: 28 mmol/L (ref 20–29)
Calcium: 9.6 mg/dL (ref 8.6–10.2)
Chloride: 100 mmol/L (ref 96–106)
Creatinine, Ser: 0.98 mg/dL (ref 0.76–1.27)
Globulin, Total: 2.5 g/dL (ref 1.5–4.5)
Glucose: 93 mg/dL (ref 70–99)
Potassium: 3.6 mmol/L (ref 3.5–5.2)
Sodium: 143 mmol/L (ref 134–144)
Total Protein: 6.8 g/dL (ref 6.0–8.5)
eGFR: 78 mL/min/{1.73_m2} (ref >59)

## 2022-01-07 LAB — HEMOGLOBIN A1C
Est. average glucose Bld gHb Est-mCnc: 128 mg/dL
Hgb A1c MFr Bld: 6.1 % — ABNORMAL HIGH (ref 4.8–5.6)

## 2022-01-07 LAB — LIPID PANEL
Chol/HDL Ratio: 4 ratio (ref 0.0–5.0)
Cholesterol, Total: 176 mg/dL (ref 100–199)
HDL: 44 mg/dL (ref >39)
LDL Chol Calc (NIH): 105 mg/dL — ABNORMAL HIGH (ref 0–99)
Triglycerides: 151 mg/dL — ABNORMAL HIGH (ref 0–149)
VLDL Cholesterol Cal: 27 mg/dL (ref 5–40)

## 2022-01-07 LAB — CARDIOVASCULAR RISK ASSESSMENT

## 2022-01-09 ENCOUNTER — Other Ambulatory Visit: Payer: Self-pay | Admitting: Family Medicine

## 2022-02-03 ENCOUNTER — Encounter: Payer: Self-pay | Admitting: Nurse Practitioner

## 2022-02-03 ENCOUNTER — Other Ambulatory Visit: Payer: Self-pay

## 2022-02-03 ENCOUNTER — Ambulatory Visit (INDEPENDENT_AMBULATORY_CARE_PROVIDER_SITE_OTHER): Payer: PPO | Admitting: Nurse Practitioner

## 2022-02-03 VITALS — BP 138/64 | HR 73 | Temp 97.3°F | Ht 70.75 in | Wt 231.0 lb

## 2022-02-03 DIAGNOSIS — I10 Essential (primary) hypertension: Secondary | ICD-10-CM | POA: Diagnosis not present

## 2022-02-03 MED ORDER — VALSARTAN 160 MG PO TABS: 160.0000 mg | ORAL_TABLET | Freq: Every day | ORAL | 3 refills | Status: DC

## 2022-02-03 NOTE — Patient Instructions (Signed)
Increase Diovan (Valsartan) to 160 mg (take two 80 mg tablets) Monitor BP, keep log Follow-up in 2 weeks for BP check and labs  Managing Your Hypertension Hypertension, also called high blood pressure, is when the force of the blood pressing against the walls of the arteries is too strong. Arteries are blood vessels that carry blood from your heart throughout your body. Hypertension forces the heart to work harder to pump blood and may cause the arteries to become narrow or stiff. Understanding blood pressure readings Your personal target blood pressure may vary depending on your medical conditions, your age, and other factors. A blood pressure reading includes a higher number over a lower number. Ideally, your blood pressure should be below 120/80. You should know that: The first, or top, number is called the systolic pressure. It is a measure of the pressure in your arteries as your heart beats. The second, or bottom number, is called the diastolic pressure. It is a measure of the pressure in your arteries as the heart relaxes. Blood pressure is classified into four stages. Based on your blood pressure reading, your health care provider may use the following stages to determine what type of treatment you need, if any. Systolic pressure and diastolic pressure are measured in a unit called mmHg. Normal Systolic pressure: below 123456. Diastolic pressure: below 80. Elevated Systolic pressure: [id number]. Diastolic pressure: below 80. Hypertension stage 1 Systolic pressure: 0000000. Diastolic pressure: [id number]. Hypertension stage 2 Systolic pressure: [id number] or above. Diastolic pressure: 90 or above. How can this condition affect me? Managing your hypertension is an important responsibility. Over time, hypertension can damage the arteries and decrease blood flow to important parts of the body, including the brain, heart, and kidneys. Having untreated or uncontrolled hypertension can lead to: A heart  attack. A stroke. A weakened blood vessel (aneurysm). Heart failure. Kidney damage. Eye damage. Metabolic syndrome. Memory and concentration problems. Vascular dementia. What actions can I take to manage this condition? Hypertension can be managed by making lifestyle changes and possibly by taking medicines. Your health care provider will help you make a plan to bring your blood pressure within a normal range. Nutrition  Eat a diet that is high in fiber and potassium, and low in salt (sodium), added sugar, and fat. An example eating plan is called the Dietary Approaches to Stop Hypertension (DASH) diet. To eat this way: Eat plenty of fresh fruits and vegetables. Try to fill one-half of your plate at each meal with fruits and vegetables. Eat whole grains, such as whole-wheat pasta, brown rice, or whole-grain bread. Fill about one-fourth of your plate with whole grains. Eat low-fat dairy products. Avoid fatty cuts of meat, processed or cured meats, and poultry with skin. Fill about one-fourth of your plate with lean proteins such as fish, chicken without skin, beans, eggs, and tofu. Avoid pre-made and processed foods. These tend to be higher in sodium, added sugar, and fat. Reduce your daily sodium intake. Most people with hypertension should eat less than 1,500 mg of sodium a day. Lifestyle  Work with your health care provider to maintain a healthy body weight or to lose weight. Ask what an ideal weight is for you. Get at least 30 minutes of exercise that causes your heart to beat faster (aerobic exercise) most days of the week. Activities may include walking, swimming, or biking. Include exercise to strengthen your muscles (resistance exercise), such as weight lifting, as part of your weekly exercise routine. Try to do these types  of exercises for 30 minutes at least 3 days a week. Do not use any products that contain nicotine or tobacco, such as cigarettes, e-cigarettes, and chewing  tobacco. If you need help quitting, ask your health care provider. Control any long-term (chronic) conditions you have, such as high cholesterol or diabetes. Identify your sources of stress and find ways to manage stress. This may include meditation, deep breathing, or making time for fun activities. Alcohol use Do not drink alcohol if: Your health care provider tells you not to drink. You are pregnant, may be pregnant, or are planning to become pregnant. If you drink alcohol: Limit how much you use to: 0-1 drink a day for women. 0-2 drinks a day for men. Be aware of how much alcohol is in your drink. In the [place] one drink equals one 12 oz bottle of beer (355 mL), one 5 oz glass of wine (148 mL), or one 1 oz glass of hard liquor (44 mL). Medicines Your health care provider may prescribe medicine if lifestyle changes are not enough to get your blood pressure under control and if: Your systolic blood pressure is 130 or higher. Your diastolic blood pressure is 80 or higher. Take medicines only as told by your health care provider. Follow the directions carefully. Blood pressure medicines must be taken as told by your health care provider. The medicine does not work as well when you skip doses. Skipping doses also puts you at risk for problems. Monitoring Before you monitor your blood pressure: Do not smoke, drink caffeinated beverages, or exercise within 30 minutes before taking a measurement. Use the bathroom and empty your bladder (urinate). Sit quietly for at least 5 minutes before taking measurements. Monitor your blood pressure at home as told by your health care provider. To do this: Sit with your back straight and supported. Place your feet flat on the floor. Do not cross your legs. Support your arm on a flat surface, such as a table. Make sure your upper arm is at heart level. Each time you measure, take two or three readings one minute apart and record the results. You may also  need to have your blood pressure checked regularly by your health care provider. General information Talk with your health care provider about your diet, exercise habits, and other lifestyle factors that may be contributing to hypertension. Review all the medicines you take with your health care provider because there may be side effects or interactions. Keep all visits as told by your health care provider. Your health care provider can help you create and adjust your plan for managing your high blood pressure. Where to find more information National Heart, Lung, and Blood Institute: https://wilson-eaton.com/ American Heart Association: www.heart.org Contact a health care provider if: You think you are having a reaction to medicines you have taken. You have repeated (recurrent) headaches. You feel dizzy. You have swelling in your ankles. You have trouble with your vision. Get help right away if: You develop a severe headache or confusion. You have unusual weakness or numbness, or you feel faint. You have severe pain in your chest or abdomen. You vomit repeatedly. You have trouble breathing. These symptoms may represent a serious problem that is an emergency. Do not wait to see if the symptoms will go away. Get medical help right away. Call your local emergency services (911 in the [place] Do not drive yourself to the hospital. Summary Hypertension is when the force of blood pumping through your arteries is too  strong. If this condition is not controlled, it may put you at risk for serious complications. Your personal target blood pressure may vary depending on your medical conditions, your age, and other factors. For most people, a normal blood pressure is less than 120/80. Hypertension is managed by lifestyle changes, medicines, or both. Lifestyle changes to help manage hypertension include losing weight, eating a healthy, low-sodium diet, exercising more, stopping smoking, and limiting  alcohol. This information is not intended to replace advice given to you by your health care provider. Make sure you discuss any questions you have with your health care provider. Document Revised: 12/11/2019 Document Reviewed: 10/24/2019 Elsevier Patient Education  2022 [employer] DASH Eating Plan DASH stands for Dietary Approaches to Stop Hypertension. The DASH eating plan is a healthy eating plan that has been shown to: Reduce high blood pressure (hypertension). Reduce your risk for type 2 diabetes, heart disease, and stroke. Help with weight loss. What are tips for following this plan? Reading food labels Check food labels for the amount of salt (sodium) per serving. Choose foods with less than 5 percent of the Daily Value of sodium. Generally, foods with less than 300 milligrams (mg) of sodium per serving fit into this eating plan. To find whole grains, look for the word "whole" as the first word in the ingredient list. Shopping Buy products labeled as "low-sodium" or "no salt added." Buy fresh foods. Avoid canned foods and pre-made or frozen meals. Cooking Avoid adding salt when cooking. Use salt-free seasonings or herbs instead of table salt or sea salt. Check with your health care provider or pharmacist before using salt substitutes. Do not fry foods. Cook foods using healthy methods such as baking, boiling, grilling, roasting, and broiling instead. Cook with heart-healthy oils, such as olive, canola, avocado, soybean, or sunflower oil. Meal planning  Eat a balanced diet that includes: 4 or more servings of fruits and 4 or more servings of vegetables each day. Try to fill one-half of your plate with fruits and vegetables. 6-8 servings of whole grains each day. Less than 6 oz (170 g) of lean meat, poultry, or fish each day. A 3-oz (85-g) serving of meat is about the same size as a deck of cards. One egg equals 1 oz (28 g). 2-3 servings of low-fat dairy each day. One serving is 1  cup (237 mL). 1 serving of nuts, seeds, or beans 5 times each week. 2-3 servings of heart-healthy fats. Healthy fats called omega-3 fatty acids are found in foods such as walnuts, flaxseeds, fortified milks, and eggs. These fats are also found in cold-water fish, such as sardines, salmon, and mackerel. Limit how much you eat of: Canned or prepackaged foods. Food that is high in trans fat, such as some fried foods. Food that is high in saturated fat, such as fatty meat. Desserts and other sweets, sugary drinks, and other foods with added sugar. Full-fat dairy products. Do not salt foods before eating. Do not eat more than 4 egg yolks a week. Try to eat at least 2 vegetarian meals a week. Eat more home-cooked food and less restaurant, buffet, and fast food. Lifestyle When eating at a restaurant, ask that your food be prepared with less salt or no salt, if possible. If you drink alcohol: Limit how much you use to: 0-1 drink a day for women who are not pregnant. 0-2 drinks a day for men. Be aware of how much alcohol is in your drink. In the [place] one drink  equals one 12 oz bottle of beer (355 mL), one 5 oz glass of wine (148 mL), or one 1 oz glass of hard liquor (44 mL). General information Avoid eating more than 2,300 mg of salt a day. If you have hypertension, you may need to reduce your sodium intake to 1,500 mg a day. Work with your health care provider to maintain a healthy body weight or to lose weight. Ask what an ideal weight is for you. Get at least 30 minutes of exercise that causes your heart to beat faster (aerobic exercise) most days of the week. Activities may include walking, swimming, or biking. Work with your health care provider or dietitian to adjust your eating plan to your individual calorie needs. What foods should I eat? Fruits All fresh, dried, or frozen fruit. Canned fruit in natural juice (without added sugar). Vegetables Fresh or frozen vegetables (raw, steamed,  roasted, or grilled). Low-sodium or reduced-sodium tomato and vegetable juice. Low-sodium or reduced-sodium tomato sauce and tomato paste. Low-sodium or reduced-sodium canned vegetables. Grains Whole-grain or whole-wheat bread. Whole-grain or whole-wheat pasta. Brown rice. [place]. Bulgur. Whole-grain and low-sodium cereals. Pita bread. Low-fat, low-sodium crackers. Whole-wheat flour tortillas. Meats and other proteins Skinless chicken or [place]. Ground chicken or [place]. Pork with fat trimmed off. Fish and seafood. Egg whites. Dried beans, peas, or lentils. Unsalted nuts, nut butters, and seeds. Unsalted canned beans. Lean cuts of beef with fat trimmed off. Low-sodium, lean precooked or cured meat, such as sausages or meat loaves. Dairy Low-fat (1%) or fat-free (skim) milk. Reduced-fat, low-fat, or fat-free cheeses. Nonfat, low-sodium ricotta or cottage cheese. Low-fat or nonfat yogurt. Low-fat, low-sodium cheese. Fats and oils Soft margarine without trans fats. Vegetable oil. Reduced-fat, low-fat, or light mayonnaise and salad dressings (reduced-sodium). Canola, safflower, olive, avocado, soybean, and sunflower oils. Avocado. Seasonings and condiments Herbs. Spices. Seasoning mixes without salt. Other foods Unsalted popcorn and pretzels. Fat-free sweets. The items listed above may not be a complete list of foods and beverages you can eat. Contact a dietitian for more information. What foods should I avoid? Fruits Canned fruit in a light or heavy syrup. Fried fruit. Fruit in cream or butter sauce. Vegetables Creamed or fried vegetables. Vegetables in a cheese sauce. Regular canned vegetables (not low-sodium or reduced-sodium). Regular canned tomato sauce and paste (not low-sodium or reduced-sodium). Regular tomato and vegetable juice (not low-sodium or reduced-sodium). Angie Fava. Olives. Grains Baked goods made with fat, such as croissants, muffins, or some breads. Dry pasta or rice meal  packs. Meats and other proteins Fatty cuts of meat. Ribs. Fried meat. Berniece Salines. Bologna, salami, and other precooked or cured meats, such as sausages or meat loaves. Fat from the back of a pig (fatback). Bratwurst. Salted nuts and seeds. Canned beans with added salt. Canned or smoked fish. Whole eggs or egg yolks. Chicken or [place] with skin. Dairy Whole or 2% milk, cream, and half-and-half. Whole or full-fat cream cheese. Whole-fat or sweetened yogurt. Full-fat cheese. Nondairy creamers. Whipped toppings. Processed cheese and cheese spreads. Fats and oils Butter. Stick margarine. Lard. Shortening. Ghee. Bacon fat. Tropical oils, such as coconut, palm kernel, or palm oil. Seasonings and condiments Onion salt, garlic salt, seasoned salt, table salt, and sea salt. Worcestershire sauce. Tartar sauce. Barbecue sauce. Teriyaki sauce. Soy sauce, including reduced-sodium. Steak sauce. Canned and packaged gravies. Fish sauce. Oyster sauce. Cocktail sauce. Store-bought horseradish. Ketchup. Mustard. Meat flavorings and tenderizers. Bouillon cubes. Hot sauces. Pre-made or packaged marinades. Pre-made or packaged taco seasonings. Relishes. Regular  salad dressings. Other foods Salted popcorn and pretzels. The items listed above may not be a complete list of foods and beverages you should avoid. Contact a dietitian for more information. Where to find more information National Heart, Lung, and Blood Institute: https://wilson-eaton.com/ American Heart Association: www.heart.org Academy of Nutrition and Dietetics: www.eatright.[place]: www.kidney.org Summary The DASH eating plan is a healthy eating plan that has been shown to reduce high blood pressure (hypertension). It may also reduce your risk for type 2 diabetes, heart disease, and stroke. When on the DASH eating plan, aim to eat more fresh fruits and vegetables, whole grains, lean proteins, low-fat dairy, and heart-healthy fats. With the DASH  eating plan, you should limit salt (sodium) intake to 2,300 mg a day. If you have hypertension, you may need to reduce your sodium intake to 1,500 mg a day. Work with your health care provider or dietitian to adjust your eating plan to your individual calorie needs. This information is not intended to replace advice given to you by your health care provider. Make sure you discuss any questions you have with your health care provider. Document Revised: 10/27/2019 Document Reviewed: 10/27/2019 Elsevier Patient Education  2022 [employer].

## 2022-02-03 NOTE — Progress Notes (Signed)
Subjective:  Patient ID: Jesse Brunswick., male    DOB: December 02, 1941  Age: 81 y.o. MRN: [id number]  CC: Hypertension  HPI  Jesse [name] is an 81 year old Caucasian male that presents for follow-up of uncontrolled BP.  He was last seen for hypertension 4 weeks ago.  BP at that visit was 150/78. Management since that visit includes Valsartan, HCTZ and Verapamil.  He reports excellent compliance with treatment. He is not having side effects.  He is following a Regular diet. He is exercising. He does not smoke.  Use of agents associated with hypertension: none.   Outside blood pressures are 140/76 p69,    149/85 p62,    138/75 p78,            160/88 p76,    146/74 p68,    159/74 p64,    152/75 p78,    155/79 p68,           158/92 p68,    145/78 p74,    158/68,           169/79 p68,    153/77 p69,            135/69 p72,    157/95 p69,    143/77 p69,    139/70,           142/81 p70 Symptoms: No chest pain No chest pressure  No palpitations No syncope  No dyspnea No orthopnea  No paroxysmal nocturnal dyspnea No lower extremity edema   Pertinent labs: Lab Results  Component Value Date   CHOL 176 01/06/2022   HDL 44 01/06/2022   LDLCALC 105 (H) 01/06/2022   TRIG 151 (H) 01/06/2022   CHOLHDL 4.0 01/06/2022   Lab Results  Component Value Date   NA 143 01/06/2022   K 3.6 01/06/2022   CREATININE 0.98 01/06/2022   EGFR 78 01/06/2022   GFRNONAA 73 01/02/2021   GLUCOSE 93 01/06/2022         Current Outpatient Medications on File Prior to Visit  Medication Sig Dispense Refill   acetaminophen (TYLENOL) 325 MG tablet Take 2 tablets (650 mg total) by mouth every 4 (four) hours as needed for mild pain, fever or headache.     Ascorbic Acid (VITAMIN C) 1000 MG tablet Take 1,000 mg by mouth daily.     cholecalciferol (VITAMIN D3) 25 MCG (1000 UNIT) tablet Take 2,000 Units by mouth daily.     Fexofenadine HCl (ALLERGY 24-HR PO) Take by mouth.     fluticasone (FLONASE) 50 MCG/ACT nasal spray  Place 2 sprays into both nostrils daily.     hydrochlorothiazide (HYDRODIURIL) 25 MG tablet Take 1 tablet (25 mg total) by mouth daily. 90 tablet 1   omeprazole (PRILOSEC) 20 MG capsule Take 20 mg by mouth daily before breakfast.      valsartan (DIOVAN) 80 MG tablet Take 1 tablet (80 mg total) by mouth daily. 90 tablet 0   verapamil (CALAN-SR) 120 MG CR tablet TAKE 1 TABLET BY MOUTH AT BEDTIME. 90 tablet 1   Zinc Sulfate (ZINC-220 PO) Take 1 capsule by mouth daily.      Current Facility-Administered Medications on File Prior to Visit  Medication Dose Route Frequency Provider Last Rate Last Admin   triamcinolone acetonide (KENALOG-40) injection 40 mg  40 mg Intra-articular Once [name], MD       Past Medical History:  Diagnosis Date   Acute pulmonary embolism without acute cor pulmonale ([place]) 09/02/2020   Arthritis    Degenerative  lumbar spinal stenosis    Fatty liver    GERD (gastroesophageal reflux disease)    Headache    History of kidney stones    Hyperlipidemia    Hypertension    Mastodynia    Migraine without aura and without status migrainosus, not intractable    Neuritis or radiculitis due to rupture of lumbar intervertebral disc 07/15/2018   Radiculopathy of thoracolumbar region 07/15/2018   Radiculopathy of thoracolumbar region 07/15/2018   Past Surgical History:  Procedure Laterality Date   ANTERIOR LATERAL LUMBAR FUSION WITH PERCUTANEOUS SCREW 2 LEVEL N/A 07/13/2018   Procedure: ANTERIOR LATERAL LUMBAR FUSION L2-4;  Surgeon: Melina Schools, MD;  Location: [facility];  Service: Orthopedics;  Laterality: N/A;  4 hrs   CHOLECYSTECTOMY  1999   GALLBLADDER SURGERY  2016   NECK SURGERY  1995   Ruptured disc repair   SHOULDER SURGERY Left 2005   SHOULDER SURGERY Right 2016   THROAT SURGERY     stretching    Family History  Problem Relation Age of Onset   Hypertension Mother    Cancer Father    Diabetes Brother    Migraines Neg Hx    Social History   Socioeconomic  History   Marital status: Married    Spouse name: [name]   Number of children: 4   Years of education: 12+   Highest education level: Not on file  Occupational History   Occupation: Retired  Tobacco Use   Smoking status: Former    Years: 15.00    Types: Cigarettes    Quit date: 1980    Years since quitting: 43.1   Smokeless tobacco: Never   Tobacco comments:    12.2.19-quit at least 40 years ago   Electronics engineer Use   Vaping Use: Never used  Substance and Sexual Activity   Alcohol use: No   Drug use: No   Sexual activity: Not Currently  Other Topics Concern   Not on file  Social History Narrative   Lives with wife   Caffeine use: Drinks decaf coffee and tea   Writes right-handed, eats and shaves left-handed   Social Determinants of Health   Financial Resource Strain: Not on file  Food Insecurity: Not on file  Transportation Needs: Not on file  Physical Activity: Not on file  Stress: Not on file  Social Connections: Not on file    Review of Systems  Constitutional:  Negative for chills, diaphoresis, fatigue and fever.  HENT:  Negative for congestion, ear pain and sore throat.   Respiratory:  Negative for cough and shortness of breath.   Cardiovascular:  Negative for chest pain and leg swelling.  Gastrointestinal:  Negative for abdominal pain, constipation, diarrhea, nausea and vomiting.  Genitourinary:  Negative for dysuria and urgency.  Musculoskeletal:  Negative for arthralgias and myalgias.  Neurological:  Negative for dizziness and headaches.  Psychiatric/Behavioral:  Negative for dysphoric mood.     Objective:  BP 138/64    Pulse 73    Temp (!) 97.3 F (36.3 C)    Ht 5' 10.75" (1.797 m)    Wt 231 lb (104.8 kg)    SpO2 99%    BMI 32.45 kg/m    BP/Weight 01/06/2022 11/13/2021 57/84/6962  Systolic BP 952 841 324  Diastolic BP 78 70 68  Wt. (Lbs) 227 227 232  BMI 31.75 30.79 31.46    Physical Exam Vitals reviewed.  Constitutional:      Appearance: Normal  appearance.  Cardiovascular:  Rate and Rhythm: Normal rate and regular rhythm.     Pulses: Normal pulses.     Heart sounds: Normal heart sounds.  Pulmonary:     Effort: Pulmonary effort is normal.     Breath sounds: Normal breath sounds.  Abdominal:     General: Bowel sounds are normal.     Palpations: Abdomen is soft.  Skin:    General: Skin is warm and dry.     Capillary Refill: Capillary refill takes less than 2 seconds.  Neurological:     General: No focal deficit present.     Mental Status: He is alert and oriented to person, place, and time.  Psychiatric:        Mood and Affect: Mood normal.        Behavior: Behavior normal.        Lab Results  Component Value Date   WBC 5.8 01/06/2022   HGB 16.1 01/06/2022   HCT 47.2 01/06/2022   PLT 143 (L) 01/06/2022   GLUCOSE 93 01/06/2022   CHOL 176 01/06/2022   TRIG 151 (H) 01/06/2022   HDL 44 01/06/2022   LDLCALC 105 (H) 01/06/2022   ALT 59 (H) 01/06/2022   AST 35 01/06/2022   NA 143 01/06/2022   K 3.6 01/06/2022   CL 100 01/06/2022   CREATININE 0.98 01/06/2022   BUN 13 01/06/2022   CO2 28 01/06/2022   INR 1.6 (H) 09/02/2020   HGBA1C 6.1 (H) 01/06/2022      Assessment & Plan:   1. Essential hypertension - valsartan (DIOVAN) 160 MG tablet; Take 1 tablet (160 mg total) by mouth daily.  Dispense: 90 tablet; Refill: 3 - Comprehensive metabolic panel; Future   2. Uncontrolled hypertension - valsartan (DIOVAN) 160 MG tablet; Take 1 tablet (160 mg total) by mouth daily.  Dispense: 90 tablet; Refill: 3 - Comprehensive metabolic panel; Future     Increase Diovan (Valsartan) to 160 mg (take two 80 mg tablets) Monitor BP, keep log Follow-up in 2 weeks for BP check and labs    Follow-up: 2-weeks  An After Visit Summary was printed and given to the patient.  I, Rip Harbour, NP, have reviewed all documentation for this visit. The documentation on 02/03/22 for the exam, diagnosis, procedures, and orders are  all accurate and complete.    Signed, Rip Harbour, NP [place] [phone]

## 2022-02-11 DIAGNOSIS — M19012 Primary osteoarthritis, left shoulder: Secondary | ICD-10-CM | POA: Diagnosis not present

## 2022-02-17 ENCOUNTER — Encounter: Payer: Self-pay | Admitting: Nurse Practitioner

## 2022-02-17 ENCOUNTER — Other Ambulatory Visit: Payer: Self-pay

## 2022-02-17 ENCOUNTER — Ambulatory Visit (INDEPENDENT_AMBULATORY_CARE_PROVIDER_SITE_OTHER): Payer: PPO | Admitting: Nurse Practitioner

## 2022-02-17 VITALS — BP 136/80 | HR 76 | Temp 97.1°F | Ht 70.75 in | Wt 229.0 lb

## 2022-02-17 DIAGNOSIS — I10 Essential (primary) hypertension: Secondary | ICD-10-CM | POA: Diagnosis not present

## 2022-02-17 MED ORDER — VALSARTAN 320 MG PO TABS: 320.0000 mg | ORAL_TABLET | Freq: Every day | ORAL | 0 refills | Status: DC

## 2022-02-17 NOTE — Patient Instructions (Signed)
Increase Diovan to 320 mg daily ?Continue other medications ?Monitor BP, keep log ?Notify office immediately of any adverse side effects ?Return for follow-up of high blood pressure in 2 weeks, bring BP log ? ?Managing Your Hypertension ?Hypertension, also called high blood pressure, is when the force of the blood pressing against the walls of the arteries is too strong. Arteries are blood vessels that carry blood from your heart throughout your body. Hypertension forces the heart to work harder to pump blood and may cause the arteries to become narrow or stiff. ?Understanding blood pressure readings ?Your personal target blood pressure may vary depending on your medical conditions, your age, and other factors. A blood pressure reading includes a higher number over a lower number. Ideally, your blood pressure should be below 120/80. You should know that: ?The first, or top, number is called the systolic pressure. It is a measure of the pressure in your arteries as your heart beats. ?The second, or bottom number, is called the diastolic pressure. It is a measure of the pressure in your arteries as the heart relaxes. ?Blood pressure is classified into four stages. Based on your blood pressure reading, your health care provider may use the following stages to determine what type of treatment you need, if any. Systolic pressure and diastolic pressure are measured in a unit called mmHg. ?Normal ?Systolic pressure: below 120. ?Diastolic pressure: below 80. ?Elevated ?Systolic pressure: 120-129. ?Diastolic pressure: below 80. ?Hypertension stage 1 ?Systolic pressure: 130-139. ?Diastolic pressure: 80-89. ?Hypertension stage 2 ?Systolic pressure: 140 or above. ?Diastolic pressure: 90 or above. ?How can this condition affect me? ?Managing your hypertension is an important responsibility. Over time, hypertension can damage the arteries and decrease blood flow to important parts of the body, including the brain, heart, and  kidneys. Having untreated or uncontrolled hypertension can lead to: ?A heart attack. ?A stroke. ?A weakened blood vessel (aneurysm). ?Heart failure. ?Kidney damage. ?Eye damage. ?Metabolic syndrome. ?Memory and concentration problems. ?Vascular dementia. ?What actions can I take to manage this condition? ?Hypertension can be managed by making lifestyle changes and possibly by taking medicines. Your health care provider will help you make a plan to bring your blood pressure within a normal range. ?Nutrition ? ?Eat a diet that is high in fiber and potassium, and low in salt (sodium), added sugar, and fat. An example eating plan is called the Dietary Approaches to Stop Hypertension (DASH) diet. To eat this way: ?Eat plenty of fresh fruits and vegetables. Try to fill one-half of your plate at each meal with fruits and vegetables. ?Eat whole grains, such as whole-wheat pasta, brown rice, or whole-grain bread. Fill about one-fourth of your plate with whole grains. ?Eat low-fat dairy products. ?Avoid fatty cuts of meat, processed or cured meats, and poultry with skin. Fill about one-fourth of your plate with lean proteins such as fish, chicken without skin, beans, eggs, and tofu. ?Avoid pre-made and processed foods. These tend to be higher in sodium, added sugar, and fat. ?Reduce your daily sodium intake. Most people with hypertension should eat less than 1,500 mg of sodium a day. ?Lifestyle ? ?Work with your health care provider to maintain a healthy body weight or to lose weight. Ask what an ideal weight is for you. ?Get at least 30 minutes of exercise that causes your heart to beat faster (aerobic exercise) most days of the week. Activities may include walking, swimming, or biking. ?Include exercise to strengthen your muscles (resistance exercise), such as weight lifting, as part  of your weekly exercise routine. Try to do these types of exercises for 30 minutes at least 3 days a week. ?Do not use any products that  contain nicotine or tobacco, such as cigarettes, e-cigarettes, and chewing tobacco. If you need help quitting, ask your health care provider. ?Control any long-term (chronic) conditions you have, such as high cholesterol or diabetes. ?Identify your sources of stress and find ways to manage stress. This may include meditation, deep breathing, or making time for fun activities. ?Alcohol use ?Do not drink alcohol if: ?Your health care provider tells you not to drink. ?You are pregnant, may be pregnant, or are planning to become pregnant. ?If you drink alcohol: ?Limit how much you use to: ?0-1 drink a day for women. ?0-2 drinks a day for men. ?Be aware of how much alcohol is in your drink. In the [place] one drink equals one 12 oz bottle of beer (355 mL), one 5 oz glass of wine (148 mL), or one 1? oz glass of hard liquor (44 mL). ?Medicines ?Your health care provider may prescribe medicine if lifestyle changes are not enough to get your blood pressure under control and if: ?Your systolic blood pressure is 130 or higher. ?Your diastolic blood pressure is 80 or higher. ?Take medicines only as told by your health care provider. Follow the directions carefully. Blood pressure medicines must be taken as told by your health care provider. The medicine does not work as well when you skip doses. Skipping doses also puts you at risk for problems. ?Monitoring ?Before you monitor your blood pressure: ?Do not smoke, drink caffeinated beverages, or exercise within 30 minutes before taking a measurement. ?Use the bathroom and empty your bladder (urinate). ?Sit quietly for at least 5 minutes before taking measurements. ?Monitor your blood pressure at home as told by your health care provider. To do this: ?Sit with your back straight and supported. ?Place your feet flat on the floor. Do not cross your legs. ?Support your arm on a flat surface, such as a table. Make sure your upper arm is at heart level. ?Each time you measure, take two  or three readings one minute apart and record the results. ?You may also need to have your blood pressure checked regularly by your health care provider. ?General information ?Talk with your health care provider about your diet, exercise habits, and other lifestyle factors that may be contributing to hypertension. ?Review all the medicines you take with your health care provider because there may be side effects or interactions. ?Keep all visits as told by your health care provider. Your health care provider can help you create and adjust your plan for managing your high blood pressure. ?Where to find more information ?National Heart, Lung, and Blood Institute: PopSteam.is ?American Heart Association: www.heart.org ?Contact a health care provider if: ?You think you are having a reaction to medicines you have taken. ?You have repeated (recurrent) headaches. ?You feel dizzy. ?You have swelling in your ankles. ?You have trouble with your vision. ?Get help right away if: ?You develop a severe headache or confusion. ?You have unusual weakness or numbness, or you feel faint. ?You have severe pain in your chest or abdomen. ?You vomit repeatedly. ?You have trouble breathing. ?These symptoms may represent a serious problem that is an emergency. Do not wait to see if the symptoms will go away. Get medical help right away. Call your local emergency services (911 in the [place] Do not drive yourself to the hospital. ?Summary ?Hypertension is when  the force of blood pumping through your arteries is too strong. If this condition is not controlled, it may put you at risk for serious complications. ?Your personal target blood pressure may vary depending on your medical conditions, your age, and other factors. For most people, a normal blood pressure is less than 120/80. ?Hypertension is managed by lifestyle changes, medicines, or both. ?Lifestyle changes to help manage hypertension include losing weight, eating a healthy,  low-sodium diet, exercising more, stopping smoking, and limiting alcohol. ?This information is not intended to replace advice given to you by your health care provider. Make sure you discuss any questions you have

## 2022-02-17 NOTE — Progress Notes (Signed)
? ?Subjective:  ?Patient ID: Jesse [name]., male    DOB: March 06, 1941  Age: 81 y.o. MRN: [id number] ? ?Chief Complaint  ?Patient presents with  ? Hypertension  ? ? ?HPI ? Jesse [name] is a 81 year old Caucasian male that presents for follow-up of hypertension.  ? ?Hypertension, follow-up: ?He was last seen for hypertension 2 weeks ago.  ?BP at that visit was 138/64. Management since that visit includes Diovan 160 mg. ? ?He reports excellent compliance with treatment. ?He is not having side effects.  ?He is following a Regular diet. ?He is not exercising. ?He does not smoke. ? ?Use of agents associated with hypertension: none.  ? ?Outside blood pressures are 140/72,   148/69,   141/79,   141/74,   142/74,  144/79,   141/60,   143/72,   147/86,   151/74,   145/76,   140/81,   141/74 ?Symptoms: ?No chest pain No chest pressure  ?No palpitations No syncope  ?No dyspnea No orthopnea  ?No paroxysmal nocturnal dyspnea No lower extremity edema  ? ?Pertinent labs: ?Lab Results  ?Component Value Date  ? CHOL 176 01/06/2022  ? HDL 44 01/06/2022  ? LDLCALC 105 (H) 01/06/2022  ? TRIG 151 (H) 01/06/2022  ? CHOLHDL 4.0 01/06/2022  ? Lab Results  ?Component Value Date  ? NA 143 01/06/2022  ? K 3.6 01/06/2022  ? CREATININE 0.98 01/06/2022  ? EGFR 78 01/06/2022  ? GFRNONAA 73 01/02/2021  ? GLUCOSE 93 01/06/2022  ?  ? ?The ASCVD Risk score (Arnett DK, et al., 2019) failed to calculate for the following reasons: ?  The 2019 ASCVD risk score is only valid for ages 4 to 26 ' ?Current Outpatient Medications on File Prior to Visit  ?Medication Sig Dispense Refill  ? acetaminophen (TYLENOL) 325 MG tablet Take 2 tablets (650 mg total) by mouth every 4 (four) hours as needed for mild pain, fever or headache.    ? Ascorbic Acid (VITAMIN C) 1000 MG tablet Take 1,000 mg by mouth daily.    ? cholecalciferol (VITAMIN D3) 25 MCG (1000 UNIT) tablet Take 2,000 Units by mouth daily.    ? Fexofenadine HCl (ALLERGY 24-HR PO) Take by mouth.    ?  fluticasone (FLONASE) 50 MCG/ACT nasal spray Place 2 sprays into both nostrils daily.    ? hydrochlorothiazide (HYDRODIURIL) 25 MG tablet Take 1 tablet (25 mg total) by mouth daily. 90 tablet 1  ? omeprazole (PRILOSEC) 20 MG capsule Take 20 mg by mouth daily before breakfast.     ? valsartan (DIOVAN) 160 MG tablet Take 1 tablet (160 mg total) by mouth daily. 90 tablet 3  ? verapamil (CALAN-SR) 120 MG CR tablet TAKE 1 TABLET BY MOUTH AT BEDTIME. 90 tablet 1  ? Zinc Sulfate (ZINC-220 PO) Take 1 capsule by mouth daily.     ? ?Current Facility-Administered Medications on File Prior to Visit  ?Medication Dose Route Frequency Provider Last Rate Last Admin  ? triamcinolone acetonide (KENALOG-40) injection 40 mg  40 mg Intra-articular Once [name], MD      ? ?Past Medical History:  ?Diagnosis Date  ? Acute pulmonary embolism without acute cor pulmonale ([place]) 09/02/2020  ? Arthritis   ? Degenerative lumbar spinal stenosis   ? Fatty liver   ? GERD (gastroesophageal reflux disease)   ? Headache   ? History of kidney stones   ? Hyperlipidemia   ? Hypertension   ? Mastodynia   ? Migraine without aura and without status  migrainosus, not intractable   ? 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 Radiculopathy of thoracolumbar region 07/15/2018  ? Radiculopathy of thoracolumbar region 07/15/2018  ? ?Past Surgical History:  ?Procedure Laterality Date  ? ANTERIOR LATERAL LUMBAR FUSION WITH PERCUTANEOUS SCREW 2 LEVEL N/A 07/13/2018  ? Procedure: ANTERIOR LATERAL LUMBAR FUSION L2-4;  Surgeon: Melina Schools, MD;  Location: [facility];  Service: Orthopedics;  Laterality: N/A;  4 hrs  ? CHOLECYSTECTOMY  1999  ? GALLBLADDER SURGERY  2016  ? NECK SURGERY  1995  ? Ruptured disc repair  ? SHOULDER SURGERY Left 2005  ? SHOULDER SURGERY Right 2016  ? THROAT SURGERY    ? stretching  ?  ?Family History  ?Problem Relation Age of Onset  ? Hypertension Mother   ? Cancer Father   ? Diabetes Brother   ? Migraines  Neg Hx   ? ?Social History  ? ?Socioeconomic History  ? Marital status: Married  ?  Spouse name: [name]  ? Number of children: 4  ? Years of education: 12+  ? Highest education level: Not on file  ?Occupational History  ? Occupation: Retired  ?Tobacco Use  ? Smoking status: Former  ?  Years: 15.00  ?  Types: Cigarettes  ?  Quit date: 26  ?  Years since quitting: 43.2  ? Smokeless tobacco: Never  ? Tobacco comments:  ?  12.2.19-quit at least 40 years ago   ?Vaping Use  ? Vaping Use: Never used  ?Substance and Sexual Activity  ? Alcohol use: No  ? Drug use: No  ? Sexual activity: Not Currently  ?Other Topics Concern  ? Not on file  ?Social History Narrative  ? Lives with wife  ? Caffeine use: Drinks decaf coffee and tea  ? Writes right-handed, eats and shaves left-handed  ? ?Social Determinants of Health  ? ?Financial Resource Strain: Not on file  ?Food Insecurity: Not on file  ?Transportation Needs: Not on file  ?Physical Activity: Not on file  ?Stress: Not on file  ?Social Connections: Not on file  ? ? ?Review of Systems  ?Constitutional:  Negative for chills, diaphoresis, fatigue and fever.  ?HENT:  Negative for congestion, ear pain and sore throat.   ?Respiratory:  Negative for cough and shortness of breath.   ?Cardiovascular:  Negative for chest pain and leg swelling.  ?Neurological:  Negative for dizziness.  ? ? ?Objective:  ? ?BP 136/80   Pulse 76   Temp (!) 97.1 ?F (36.2 ?C)   Ht 5' 10.75" (1.797 m)   Wt 229 lb (103.9 kg)   SpO2 100%   BMI 32.17 kg/m?   ?BP/Weight 02/03/2022 01/06/2022 11/13/2021  ?Systolic BP 939 030 092  ?Diastolic BP 64 78 70  ?Wt. (Lbs) 231 227 227  ?BMI 32.45 31.75 30.79  ? ? ?Physical Exam ?Vitals reviewed.  ?Cardiovascular:  ?   Rate and Rhythm: Normal rate and regular rhythm.  ?   Pulses: Normal pulses.  ?   Heart sounds: Normal heart sounds.  ?Pulmonary:  ?   Effort: Pulmonary effort is normal.  ?   Breath sounds: Normal breath sounds.  ?Abdominal:  ?   General: Bowel sounds are  normal.  ?   Palpations: Abdomen is soft.  ?Skin: ?   General: Skin is warm and dry.  ?   Capillary Refill: Capillary refill takes less than 2 seconds.  ?Neurological:  ?   General: No focal deficit present.  ?  Mental Status: He is alert and oriented to person, place, and time.  ?Psychiatric:     ?   Mood and Affect: Mood normal.     ?   Behavior: Behavior normal.  ? ? ? ?  ? ?Lab Results  ?Component Value Date  ? WBC 5.8 01/06/2022  ? HGB 16.1 01/06/2022  ? HCT 47.2 01/06/2022  ? PLT 143 (L) 01/06/2022  ? GLUCOSE 93 01/06/2022  ? CHOL 176 01/06/2022  ? TRIG 151 (H) 01/06/2022  ? HDL 44 01/06/2022  ? LDLCALC 105 (H) 01/06/2022  ? ALT 59 (H) 01/06/2022  ? AST 35 01/06/2022  ? NA 143 01/06/2022  ? K 3.6 01/06/2022  ? CL 100 01/06/2022  ? CREATININE 0.98 01/06/2022  ? BUN 13 01/06/2022  ? CO2 28 01/06/2022  ? INR 1.6 (H) 09/02/2020  ? HGBA1C 6.1 (H) 01/06/2022  ? ? ? ? ?Assessment & Plan:  ? ? ?1. Essential hypertension ?- valsartan (DIOVAN) 320 MG tablet; Take 1 tablet (320 mg total) by mouth daily.  Dispense: 90 tablet; Refill: 0 ?   ? ?Increase Diovan to 320 mg daily ?Continue other medications ?Monitor BP, keep log ?Notify office immediately of any adverse side effects ?Return for follow-up of high blood pressure in 2 weeks, bring BP log ?  ? ?Follow-up: 2-weeks ? ?An After Visit Summary was printed and given to the patient. ? ?I, Rip Harbour, NP, have reviewed all documentation for this visit. The documentation on 02/17/22 for the exam, diagnosis, procedures, and orders are all accurate and complete.  ? ? ?Signed, ?Rip Harbour, NP ?[facility] ?([phone] ?

## 2022-03-03 ENCOUNTER — Ambulatory Visit (INDEPENDENT_AMBULATORY_CARE_PROVIDER_SITE_OTHER): Payer: PPO | Admitting: Nurse Practitioner

## 2022-03-03 ENCOUNTER — Other Ambulatory Visit: Payer: Self-pay

## 2022-03-03 ENCOUNTER — Encounter: Payer: Self-pay | Admitting: Nurse Practitioner

## 2022-03-03 VITALS — BP 160/82 | HR 73 | Temp 97.9°F | Ht 71.0 in | Wt 232.0 lb

## 2022-03-03 DIAGNOSIS — I10 Essential (primary) hypertension: Secondary | ICD-10-CM

## 2022-03-03 MED ORDER — VERAPAMIL HCL ER 120 MG PO TBCR: 120.0000 mg | EXTENDED_RELEASE_TABLET | Freq: Two times a day (BID) | ORAL | 1 refills | Status: DC

## 2022-03-03 NOTE — Progress Notes (Signed)
? ?Subjective:  ?Patient ID: Jesse Jesse [name]., male    DOB: 1941-04-28  Age: 81 y.o. MRN: [id number] ? ?Chief Complaint  ?Patient presents with  ? 2 wk f/u HTN  ? ? ?HPI ?  ?Jesse Jesse [name] is an 81 year old Caucasian male that presents for follow-up of uncontrolled Jesse [name].  ? ?Jesse [name], follow-up: ?He was last seen for Jesse [name] 2 weeks ago.  ?BP at that visit was 136/80. Management since that visit includes increased Diovan 320 mg daily. ? ?He reports excellent compliance with treatment. ?He is not having side effects.  ?He is following a Regular diet. ?He is exercising. ?He does not smoke. ? ?Use of agents associated with Jesse [name]: none.  ? ?Outside blood pressures are     145/76 P64,    140/81 P67,    141/74 P64,             1328/72 P 66,    142/85 P63,    144/70 P 67,    144/73 P61,    140/76 P61,         140/76 P61,       146/66 P65,    144/74 P66,     147/73 P68,    129/68 P62,          134/71 P69,       133/66 P64,    140/72 P67. ?Symptoms: ?No chest pain No chest pressure  ?No palpitations No syncope  ?No dyspnea No orthopnea  ?No paroxysmal nocturnal dyspnea No lower extremity edema  ? ?Pertinent labs: ?Lab Results  ?Component Value Date  ? CHOL 176 01/06/2022  ? HDL 44 01/06/2022  ? LDLCALC 105 (H) 01/06/2022  ? TRIG 151 (H) 01/06/2022  ? CHOLHDL 4.0 01/06/2022  ? Lab Results  ?Component Value Date  ? NA 143 01/06/2022  ? K 3.6 01/06/2022  ? CREATININE 0.98 01/06/2022  ? EGFR 78 01/06/2022  ? GFRNONAA 73 01/02/2021  ? GLUCOSE 93 01/06/2022  ?  ? ?The ASCVD Risk score (Arnett DK, et al., 2019) failed to calculate for the following reasons: ?  The 2019 ASCVD risk score is only valid for ages 51 to 49  ? ?Current Outpatient Medications on File Prior to Visit  ?Medication Sig Dispense Refill  ? acetaminophen (TYLENOL) 325 MG tablet Take 2 tablets (650 mg total) by mouth every 4 (four) hours as needed for mild pain, fever or headache.    ? Ascorbic Acid (VITAMIN C) 1000 MG tablet Take 1,000 mg by  mouth daily.    ? cholecalciferol (VITAMIN D3) 25 MCG (1000 UNIT) tablet Take 2,000 Units by mouth daily.    ? Fexofenadine HCl (ALLERGY 24-HR PO) Take by mouth.    ? fluticasone (FLONASE) 50 MCG/ACT nasal spray Place 2 sprays into both nostrils daily.    ? hydrochlorothiazide (HYDRODIURIL) 25 MG tablet Take 1 tablet (25 mg total) by mouth daily. 90 tablet 1  ? omeprazole (PRILOSEC) 20 MG capsule Take 20 mg by mouth daily before breakfast.     ? valsartan (DIOVAN) 320 MG tablet Take 1 tablet (320 mg total) by mouth daily. 90 tablet 0  ? verapamil (CALAN-SR) 120 MG CR tablet TAKE 1 TABLET BY MOUTH AT BEDTIME. 90 tablet 1  ? Zinc Sulfate (ZINC-220 PO) Take 1 capsule by mouth daily.     ? ?Current Facility-Administered Medications on File Prior to Visit  ?Medication Dose Route Frequency Provider Last Rate Last Admin  ? triamcinolone acetonide (KENALOG-40) injection 40 mg  40 mg Intra-articular  Once [name], MD      ? ?Past Medical History:  ?Diagnosis Date  ? Acute pulmonary embolism without acute cor pulmonale ([facility]) 09/02/2020  ? Arthritis   ? Degenerative lumbar spinal stenosis   ? Fatty liver   ? GERD (gastroesophageal reflux disease)   ? Headache   ? History of kidney stones   ? Hyperlipidemia   ? Jesse [name]   ? Mastodynia   ? Migraine without aura and without status migrainosus, not intractable   ? 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 Radiculopathy of thoracolumbar region 07/15/2018  ? Radiculopathy of thoracolumbar region 07/15/2018  ? ?Past Surgical History:  ?Procedure Laterality Date  ? ANTERIOR LATERAL LUMBAR FUSION WITH PERCUTANEOUS SCREW 2 LEVEL N/A 07/13/2018  ? Procedure: ANTERIOR LATERAL LUMBAR FUSION L2-4;  Surgeon: Melina Schools, MD;  Location: [facility];  Service: Orthopedics;  Laterality: N/A;  4 hrs  ? CHOLECYSTECTOMY  1999  ? GALLBLADDER SURGERY  2016  ? NECK SURGERY  1995  ? Ruptured disc repair  ? SHOULDER SURGERY Left 2005  ? SHOULDER SURGERY Right  2016  ? THROAT SURGERY    ? stretching  ?  ?Family History  ?Problem Relation Age of Onset  ? Jesse [name] Mother   ? Cancer Father   ? Diabetes Brother   ? Migraines Neg Hx   ? ?Social History  ? ?Socioeconomic History  ? Marital status: Married  ?  Spouse name: [name]  ? Number of children: 4  ? Years of education: 12+  ? Highest education level: Not on file  ?Occupational History  ? Occupation: Retired  ?Tobacco Use  ? Smoking status: Former  ?  Years: 15.00  ?  Types: Cigarettes  ?  Quit date: 43  ?  Years since quitting: 43.2  ? Smokeless tobacco: Never  ? Tobacco comments:  ?  12.2.19-quit at least 40 years ago   ?Vaping Use  ? Vaping Use: Never used  ?Substance and Sexual Activity  ? Alcohol use: No  ? Drug use: No  ? Sexual activity: Not Currently  ?Other Topics Concern  ? Not on file  ?Social History Narrative  ? Lives with wife  ? Caffeine use: Drinks decaf coffee and tea  ? Writes right-handed, eats and shaves left-handed  ? ?Social Determinants of Health  ? ?Financial Resource Strain: Not on file  ?Food Insecurity: Not on file  ?Transportation Needs: Not on file  ?Physical Activity: Not on file  ?Stress: Not on file  ?Social Connections: Not on file  ? ? ?Review of Systems  ?Constitutional:  Negative for chills, fatigue and fever.  ?HENT:  Negative for congestion.   ?Respiratory:  Negative for cough and shortness of breath.   ?Cardiovascular:  Negative for chest pain.  ?Gastrointestinal:  Negative for diarrhea, nausea and vomiting.  ?Neurological:  Negative for dizziness and headaches.  ? ? ?Objective:  ?BP (!) 160/82   Pulse 73   Temp 97.9 ?F (36.6 ?C)   Ht _0  (1.803 m)   Wt 232 lb (105.2 kg)   SpO2 98%   BMI 32.36 kg/m?   ? ? ?  03/03/2022  ? 10:07 AM 02/17/2022  ?  9:36 AM 02/03/2022  ?  9:38 AM  ?BP/Weight  ?Systolic BP  341 962  ?Diastolic BP  80 64  ?Wt. (Lbs) 232 229 231  ?BMI 32.36 kg/m2 32.17 kg/m2 32.45 kg/m2  ? ? ?Physical Exam ?Vitals reviewed.  ?Constitutional:   ?  Appearance:  Normal appearance.  ?Cardiovascular:  ?   Rate and Rhythm: Normal rate and regular rhythm.  ?   Pulses: Normal pulses.  ?   Heart sounds: Normal heart sounds.  ?Pulmonary:  ?   Effort: Pulmonary effort is normal.  ?   Breath sounds: Normal breath sounds.  ?Abdominal:  ?   General: Bowel sounds are normal.  ?   Palpations: Abdomen is soft.  ?Skin: ?   General: Skin is warm and dry.  ?   Capillary Refill: Capillary refill takes less than 2 seconds.  ?Neurological:  ?   General: No focal deficit present.  ?   Mental Status: He is alert and oriented to person, place, and time.  ? ? ? ?  ? ?Lab Results  ?Component Value Date  ? WBC 5.8 01/06/2022  ? HGB 16.1 01/06/2022  ? HCT 47.2 01/06/2022  ? PLT 143 (L) 01/06/2022  ? GLUCOSE 93 01/06/2022  ? CHOL 176 01/06/2022  ? TRIG 151 (H) 01/06/2022  ? HDL 44 01/06/2022  ? LDLCALC 105 (H) 01/06/2022  ? ALT 59 (H) 01/06/2022  ? AST 35 01/06/2022  ? NA 143 01/06/2022  ? K 3.6 01/06/2022  ? CL 100 01/06/2022  ? CREATININE 0.98 01/06/2022  ? BUN 13 01/06/2022  ? CO2 28 01/06/2022  ? INR 1.6 (H) 09/02/2020  ? HGBA1C 6.1 (H) 01/06/2022  ? ? ? ? ?Assessment & Plan:  ? ?1. Uncontrolled Jesse [name] ?- verapamil (CALAN-SR) 120 MG CR tablet; Take 1 tablet (120 mg total) by mouth 2 (two) times daily.  Dispense: 60 tablet; Refill: 1 ?- Comprehensive metabolic panel ? ?2. Essential Jesse [name] ?- Comprehensive metabolic panel ?  ? ? ?Increase Verapamil 148m to twice daily ?Notify office immediately of any adverse side effects ?Continue Diovan 320 mg daily ?Low salt, heart healthy diet ?Monitor BP, keep log ?Return in 2-weeks for follow-up, bring BP log to next appointment ?  ? ?Follow-up: 2-weeks ? ?An After Visit Summary was printed and given to the patient. ? ?I, SRip Harbour NP, have reviewed all documentation for this visit. The documentation on 03/03/22 for the exam, diagnosis, procedures, and orders are all accurate and complete.  ? ? ?Signed, ?SRip Harbour NP ?[facility]?([phone]?

## 2022-03-03 NOTE — Patient Instructions (Signed)
Increase Verapamil 120mg  to twice daily ?Notify office immediately of any adverse side effects ?Continue Diovan 320 mg daily ?Low salt, heart healthy diet ?Monitor BP, keep log ?Return in 2-weeks for follow-up, bring BP log to next appointment ? ?Managing Your Hypertension ?Hypertension, also called high blood pressure, is when the force of the blood pressing against the walls of the arteries is too strong. Arteries are blood vessels that carry blood from your heart throughout your body. Hypertension forces the heart to work harder to pump blood and may cause the arteries to become narrow or stiff. ?Understanding blood pressure readings ?Your personal target blood pressure may vary depending on your medical conditions, your age, and other factors. A blood pressure reading includes a higher number over a lower number. Ideally, your blood pressure should be below 120/80. You should know that: ?The first, or top, number is called the systolic pressure. It is a measure of the pressure in your arteries as your heart beats. ?The second, or bottom number, is called the diastolic pressure. It is a measure of the pressure in your arteries as the heart relaxes. ?Blood pressure is classified into four stages. Based on your blood pressure reading, your health care provider may use the following stages to determine what type of treatment you need, if any. Systolic pressure and diastolic pressure are measured in a unit called mmHg. ?Normal ?Systolic pressure: below 120. ?Diastolic pressure: below 80. ?Elevated ?Systolic pressure: 120-129. ?Diastolic pressure: below 80. ?Hypertension stage 1 ?Systolic pressure: 130-139. ?Diastolic pressure: 80-89. ?Hypertension stage 2 ?Systolic pressure: 140 or above. ?Diastolic pressure: 90 or above. ?How can this condition affect me? ?Managing your hypertension is an important responsibility. Over time, hypertension can damage the arteries and decrease blood flow to important parts of the body,  including the brain, heart, and kidneys. Having untreated or uncontrolled hypertension can lead to: ?A heart attack. ?A stroke. ?A weakened blood vessel (aneurysm). ?Heart failure. ?Kidney damage. ?Eye damage. ?Metabolic syndrome. ?Memory and concentration problems. ?Vascular dementia. ?What actions can I take to manage this condition? ?Hypertension can be managed by making lifestyle changes and possibly by taking medicines. Your health care provider will help you make a plan to bring your blood pressure within a normal range. ?Nutrition ? ?Eat a diet that is high in fiber and potassium, and low in salt (sodium), added sugar, and fat. An example eating plan is called the Dietary Approaches to Stop Hypertension (DASH) diet. To eat this way: ?Eat plenty of fresh fruits and vegetables. Try to fill one-half of your plate at each meal with fruits and vegetables. ?Eat whole grains, such as whole-wheat pasta, brown rice, or whole-grain bread. Fill about one-fourth of your plate with whole grains. ?Eat low-fat dairy products. ?Avoid fatty cuts of meat, processed or cured meats, and poultry with skin. Fill about one-fourth of your plate with lean proteins such as fish, chicken without skin, beans, eggs, and tofu. ?Avoid pre-made and processed foods. These tend to be higher in sodium, added sugar, and fat. ?Reduce your daily sodium intake. Most people with hypertension should eat less than 1,500 mg of sodium a day. ?Lifestyle ? ?Work with your health care provider to maintain a healthy body weight or to lose weight. Ask what an ideal weight is for you. ?Get at least 30 minutes of exercise that causes your heart to beat faster (aerobic exercise) most days of the week. Activities may include walking, swimming, or biking. ?Include exercise to strengthen your muscles (resistance exercise), such  as weight lifting, as part of your weekly exercise routine. Try to do these types of exercises for 30 minutes at least 3 days a week. ?Do  not use any products that contain nicotine or tobacco, such as cigarettes, e-cigarettes, and chewing tobacco. If you need help quitting, ask your health care provider. ?Control any long-term (chronic) conditions you have, such as high cholesterol or diabetes. ?Identify your sources of stress and find ways to manage stress. This may include meditation, deep breathing, or making time for fun activities. ?Alcohol use ?Do not drink alcohol if: ?Your health care provider tells you not to drink. ?You are pregnant, may be pregnant, or are planning to become pregnant. ?If you drink alcohol: ?Limit how much you use to: ?0-1 drink a day for women. ?0-2 drinks a day for men. ?Be aware of how much alcohol is in your drink. In the [place] one drink equals one 12 oz bottle of beer (355 mL), one 5 oz glass of wine (148 mL), or one 1? oz glass of hard liquor (44 mL). ?Medicines ?Your health care provider may prescribe medicine if lifestyle changes are not enough to get your blood pressure under control and if: ?Your systolic blood pressure is 130 or higher. ?Your diastolic blood pressure is 80 or higher. ?Take medicines only as told by your health care provider. Follow the directions carefully. Blood pressure medicines must be taken as told by your health care provider. The medicine does not work as well when you skip doses. Skipping doses also puts you at risk for problems. ?Monitoring ?Before you monitor your blood pressure: ?Do not smoke, drink caffeinated beverages, or exercise within 30 minutes before taking a measurement. ?Use the bathroom and empty your bladder (urinate). ?Sit quietly for at least 5 minutes before taking measurements. ?Monitor your blood pressure at home as told by your health care provider. To do this: ?Sit with your back straight and supported. ?Place your feet flat on the floor. Do not cross your legs. ?Support your arm on a flat surface, such as a table. Make sure your upper arm is at heart level. ?Each  time you measure, take two or three readings one minute apart and record the results. ?You may also need to have your blood pressure checked regularly by your health care provider. ?General information ?Talk with your health care provider about your diet, exercise habits, and other lifestyle factors that may be contributing to hypertension. ?Review all the medicines you take with your health care provider because there may be side effects or interactions. ?Keep all visits as told by your health care provider. Your health care provider can help you create and adjust your plan for managing your high blood pressure. ?Where to find more information ?National Heart, Lung, and Blood Institute: Pop[employer].iswww.nhlbi.nih.gov ?American Heart Association: www.heart.org ?Contact a health care provider if: ?You think you are having a reaction to medicines you have taken. ?You have repeated (recurrent) headaches. ?You feel dizzy. ?You have swelling in your ankles. ?You have trouble with your vision. ?Get help right away if: ?You develop a severe headache or confusion. ?You have unusual weakness or numbness, or you feel faint. ?You have severe pain in your chest or abdomen. ?You vomit repeatedly. ?You have trouble breathing. ?These symptoms may represent a serious problem that is an emergency. Do not wait to see if the symptoms will go away. Get medical help right away. Call your local emergency services (911 in the [place] Do not drive yourself to the  hospital. ?Summary ?Hypertension is when the force of blood pumping through your arteries is too strong. If this condition is not controlled, it may put you at risk for serious complications. ?Your personal target blood pressure may vary depending on your medical conditions, your age, and other factors. For most people, a normal blood pressure is less than 120/80. ?Hypertension is managed by lifestyle changes, medicines, or both. ?Lifestyle changes to help manage hypertension include losing  weight, eating a healthy, low-sodium diet, exercising more, stopping smoking, and limiting alcohol. ?This information is not intended to replace advice given to you by your health care provider. Make sur

## 2022-03-04 LAB — COMPREHENSIVE METABOLIC PANEL
ALT: 44 IU/L (ref 0–44)
AST: 26 IU/L (ref 0–40)
Albumin/Globulin Ratio: 1.8 (ref 1.2–2.2)
Albumin: 4.2 g/dL (ref 3.7–4.7)
Alkaline Phosphatase: 72 IU/L (ref 44–121)
BUN/Creatinine Ratio: 15 (ref 10–24)
BUN: 15 mg/dL (ref 8–27)
Bilirubin Total: 0.5 mg/dL (ref 0.0–1.2)
CO2: 28 mmol/L (ref 20–29)
Calcium: 9.8 mg/dL (ref 8.6–10.2)
Chloride: 102 mmol/L (ref 96–106)
Creatinine, Ser: 0.97 mg/dL (ref 0.76–1.27)
Globulin, Total: 2.4 g/dL (ref 1.5–4.5)
Glucose: 98 mg/dL (ref 70–99)
Potassium: 3.5 mmol/L (ref 3.5–5.2)
Sodium: 143 mmol/L (ref 134–144)
Total Protein: 6.6 g/dL (ref 6.0–8.5)
eGFR: 79 mL/min/{1.73_m2} (ref >59)

## 2022-03-17 ENCOUNTER — Other Ambulatory Visit: Payer: Self-pay | Admitting: Nurse Practitioner

## 2022-03-17 ENCOUNTER — Ambulatory Visit (INDEPENDENT_AMBULATORY_CARE_PROVIDER_SITE_OTHER): Payer: PPO | Admitting: Nurse Practitioner

## 2022-03-17 ENCOUNTER — Encounter: Payer: Self-pay | Admitting: Nurse Practitioner

## 2022-03-17 VITALS — BP 132/58 | HR 78 | Temp 97.4°F | Resp 16 | Ht 71.0 in | Wt 234.0 lb

## 2022-03-17 DIAGNOSIS — I10 Essential (primary) hypertension: Secondary | ICD-10-CM

## 2022-03-17 MED ORDER — VERAPAMIL HCL ER 120 MG PO TBCR: 120.0000 mg | EXTENDED_RELEASE_TABLET | Freq: Two times a day (BID) | ORAL | 1 refills | Status: DC

## 2022-03-17 NOTE — Patient Instructions (Addendum)
Continue medications  ?Follow-up in 56-months, fasting or sooner if needed ? ? ?Managing Your Hypertension ?Hypertension, also called high blood pressure, is when the force of the blood pressing against the walls of the arteries is too strong. Arteries are blood vessels that carry blood from your heart throughout your body. Hypertension forces the heart to work harder to pump blood and may cause the arteries to become narrow or stiff. ?Understanding blood pressure readings ?Your personal target blood pressure may vary depending on your medical conditions, your age, and other factors. A blood pressure reading includes a higher number over a lower number. Ideally, your blood pressure should be below 120/80. You should know that: ?The first, or top, number is called the systolic pressure. It is a measure of the pressure in your arteries as your heart beats. ?The second, or bottom number, is called the diastolic pressure. It is a measure of the pressure in your arteries as the heart relaxes. ?Blood pressure is classified into four stages. Based on your blood pressure reading, your health care provider may use the following stages to determine what type of treatment you need, if any. Systolic pressure and diastolic pressure are measured in a unit called mmHg. ?Normal ?Systolic pressure: below 120. ?Diastolic pressure: below 80. ?Elevated ?Systolic pressure: 120-129. ?Diastolic pressure: below 80. ?Hypertension stage 1 ?Systolic pressure: 130-139. ?Diastolic pressure: 80-89. ?Hypertension stage 2 ?Systolic pressure: 140 or above. ?Diastolic pressure: 90 or above. ?How can this condition affect me? ?Managing your hypertension is an important responsibility. Over time, hypertension can damage the arteries and decrease blood flow to important parts of the body, including the brain, heart, and kidneys. Having untreated or uncontrolled hypertension can lead to: ?A heart attack. ?A stroke. ?A weakened blood vessel  (aneurysm). ?Heart failure. ?Kidney damage. ?Eye damage. ?Metabolic syndrome. ?Memory and concentration problems. ?Vascular dementia. ?What actions can I take to manage this condition? ?Hypertension can be managed by making lifestyle changes and possibly by taking medicines. Your health care provider will help you make a plan to bring your blood pressure within a normal range. ?Nutrition ? ?Eat a diet that is high in fiber and potassium, and low in salt (sodium), added sugar, and fat. An example eating plan is called the Dietary Approaches to Stop Hypertension (DASH) diet. To eat this way: ?Eat plenty of fresh fruits and vegetables. Try to fill one-half of your plate at each meal with fruits and vegetables. ?Eat whole grains, such as whole-wheat pasta, brown rice, or whole-grain bread. Fill about one-fourth of your plate with whole grains. ?Eat low-fat dairy products. ?Avoid fatty cuts of meat, processed or cured meats, and poultry with skin. Fill about one-fourth of your plate with lean proteins such as fish, chicken without skin, beans, eggs, and tofu. ?Avoid pre-made and processed foods. These tend to be higher in sodium, added sugar, and fat. ?Reduce your daily sodium intake. Most people with hypertension should eat less than 1,500 mg of sodium a day. ?Lifestyle ? ?Work with your health care provider to maintain a healthy body weight or to lose weight. Ask what an ideal weight is for you. ?Get at least 30 minutes of exercise that causes your heart to beat faster (aerobic exercise) most days of the week. Activities may include walking, swimming, or biking. ?Include exercise to strengthen your muscles (resistance exercise), such as weight lifting, as part of your weekly exercise routine. Try to do these types of exercises for 30 minutes at least 3 days a week. ?Do  not use any products that contain nicotine or tobacco, such as cigarettes, e-cigarettes, and chewing tobacco. If you need help quitting, ask your health  care provider. ?Control any long-term (chronic) conditions you have, such as high cholesterol or diabetes. ?Identify your sources of stress and find ways to manage stress. This may include meditation, deep breathing, or making time for fun activities. ?Alcohol use ?Do not drink alcohol if: ?Your health care provider tells you not to drink. ?You are pregnant, may be pregnant, or are planning to become pregnant. ?If you drink alcohol: ?Limit how much you use to: ?0-1 drink a day for women. ?0-2 drinks a day for men. ?Be aware of how much alcohol is in your drink. In the [place] one drink equals one 12 oz bottle of beer (355 mL), one 5 oz glass of wine (148 mL), or one 1? oz glass of hard liquor (44 mL). ?Medicines ?Your health care provider may prescribe medicine if lifestyle changes are not enough to get your blood pressure under control and if: ?Your systolic blood pressure is 130 or higher. ?Your diastolic blood pressure is 80 or higher. ?Take medicines only as told by your health care provider. Follow the directions carefully. Blood pressure medicines must be taken as told by your health care provider. The medicine does not work as well when you skip doses. Skipping doses also puts you at risk for problems. ?Monitoring ?Before you monitor your blood pressure: ?Do not smoke, drink caffeinated beverages, or exercise within 30 minutes before taking a measurement. ?Use the bathroom and empty your bladder (urinate). ?Sit quietly for at least 5 minutes before taking measurements. ?Monitor your blood pressure at home as told by your health care provider. To do this: ?Sit with your back straight and supported. ?Place your feet flat on the floor. Do not cross your legs. ?Support your arm on a flat surface, such as a table. Make sure your upper arm is at heart level. ?Each time you measure, take two or three readings one minute apart and record the results. ?You may also need to have your blood pressure checked regularly by  your health care provider. ?General information ?Talk with your health care provider about your diet, exercise habits, and other lifestyle factors that may be contributing to hypertension. ?Review all the medicines you take with your health care provider because there may be side effects or interactions. ?Keep all visits as told by your health care provider. Your health care provider can help you create and adjust your plan for managing your high blood pressure. ?Where to find more information ?National Heart, Lung, and Blood Institute: PopSteam.is ?American Heart Association: www.heart.org ?Contact a health care provider if: ?You think you are having a reaction to medicines you have taken. ?You have repeated (recurrent) headaches. ?You feel dizzy. ?You have swelling in your ankles. ?You have trouble with your vision. ?Get help right away if: ?You develop a severe headache or confusion. ?You have unusual weakness or numbness, or you feel faint. ?You have severe pain in your chest or abdomen. ?You vomit repeatedly. ?You have trouble breathing. ?These symptoms may represent a serious problem that is an emergency. Do not wait to see if the symptoms will go away. Get medical help right away. Call your local emergency services (911 in the [place] Do not drive yourself to the hospital. ?Summary ?Hypertension is when the force of blood pumping through your arteries is too strong. If this condition is not controlled, it may put you at  risk for serious complications. ?Your personal target blood pressure may vary depending on your medical conditions, your age, and other factors. For most people, a normal blood pressure is less than 120/80. ?Hypertension is managed by lifestyle changes, medicines, or both. ?Lifestyle changes to help manage hypertension include losing weight, eating a healthy, low-sodium diet, exercising more, stopping smoking, and limiting alcohol. ?This information is not intended to replace advice given  to you by your health care provider. Make sure you discuss any questions you have with your health care provider. ?Document Revised: 12/11/2019 Document Reviewed: 10/24/2019 ?Elsevier Patient Education ? 2022 Karolee Oh[employer]

## 2022-03-17 NOTE — Progress Notes (Signed)
? ?Subjective:  ?Patient ID: Jesse [name]., male    DOB: [birth year]  Age: 81 y.o. MRN: [id number] ? ?Chief Complaint  ?Patient presents with  ? Hypertension  ? ? ?HPI ?Jesse [name] is a 81 year-old Caucasian male that presents for follow-up of hypertension.  ?Hypertension, follow-up ?He was last seen for hypertension 2 weeks ago.  ?BP at that visit was 160/82. Management includes HCTZ 25 mg, Verapamil 120 mg BID, and Diovan 320 mg. ? ?He reports excellent compliance with treatment. ?He is not having side effects. ?He is following a Low Sodium diet. ?He is not exercising. ?He does not smoke. ? ?Use of agents associated with hypertension: none.  ? ?Outside blood pressures are 136/72, 136/75,134/65, 155/72, 141/69, 148/74, 136/74, and 138/65 ?Symptoms: ?No chest pain No chest pressure  ?No palpitations No syncope  ?No dyspnea No orthopnea  ?No paroxysmal nocturnal dyspnea No lower extremity edema  ? ?Pertinent labs: ?Lab Results  ?Component Value Date  ? CHOL 176 01/06/2022  ? HDL 44 01/06/2022  ? LDLCALC 105 (H) 01/06/2022  ? TRIG 151 (H) 01/06/2022  ? CHOLHDL 4.0 01/06/2022  ? Lab Results  ?Component Value Date  ? NA 143 03/03/2022  ? K 3.5 03/03/2022  ? CREATININE 0.97 03/03/2022  ? EGFR 79 03/03/2022  ? GFRNONAA 73 01/02/2021  ? GLUCOSE 98 03/03/2022  ?  ? ?   ?  ?Current Outpatient Medications on File Prior to Visit  ?Medication Sig Dispense Refill  ? acetaminophen (TYLENOL) 325 MG tablet Take 2 tablets (650 mg total) by mouth every 4 (four) hours as needed for mild pain, fever or headache.    ? Ascorbic Acid (VITAMIN C) 1000 MG tablet Take 1,000 mg by mouth daily.    ? cholecalciferol (VITAMIN D3) 25 MCG (1000 UNIT) tablet Take 2,000 Units by mouth daily.    ? Fexofenadine HCl (ALLERGY 24-HR PO) Take by mouth.    ? fluticasone (FLONASE) 50 MCG/ACT nasal spray Place 2 sprays into both nostrils daily.    ? hydrochlorothiazide (HYDRODIURIL) 25 MG tablet Take 1 tablet (25 mg total) by mouth daily. 90 tablet 1  ?  omeprazole (PRILOSEC) 20 MG capsule Take 20 mg by mouth daily before breakfast.     ? valsartan (DIOVAN) 320 MG tablet Take 1 tablet (320 mg total) by mouth daily. 90 tablet 0  ? verapamil (CALAN-SR) 120 MG CR tablet Take 1 tablet (120 mg total) by mouth 2 (two) times daily. 60 tablet 1  ? Zinc Sulfate (ZINC-220 PO) Take 1 capsule by mouth daily.     ? ?Current Facility-Administered Medications on File Prior to Visit  ?Medication Dose Route Frequency Provider Last Rate Last Admin  ? triamcinolone acetonide (KENALOG-40) injection 40 mg  40 mg Intra-articular Once [name], MD      ? ?Past Medical History:  ?Diagnosis Date  ? Acute pulmonary embolism without acute cor pulmonale ([facility]) 09/02/2020  ? Arthritis   ? Degenerative lumbar spinal stenosis   ? Fatty liver   ? GERD (gastroesophageal reflux disease)   ? Headache   ? History of kidney stones   ? Hyperlipidemia   ? Hypertension   ? Mastodynia   ? Migraine without aura and without status migrainosus, not intractable   ? 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 Radiculopathy of thoracolumbar region 07/15/2018  ? Radiculopathy of thoracolumbar region 07/15/2018  ? ?Past Surgical History:  ?Procedure Laterality Date  ? ANTERIOR LATERAL LUMBAR FUSION  WITH PERCUTANEOUS SCREW 2 LEVEL N/A 07/13/2018  ? Procedure: ANTERIOR LATERAL LUMBAR FUSION L2-4;  Surgeon: Melina Schools, MD;  Location: [facility];  Service: Orthopedics;  Laterality: N/A;  4 hrs  ? CHOLECYSTECTOMY  1999  ? GALLBLADDER SURGERY  2016  ? NECK SURGERY  1995  ? Ruptured disc repair  ? SHOULDER SURGERY Left 2005  ? SHOULDER SURGERY Right 2016  ? THROAT SURGERY    ? stretching  ?  ?Family History  ?Problem Relation Age of Onset  ? Hypertension Mother   ? Cancer Father   ? Diabetes Brother   ? Migraines Neg Hx   ? ?Social History  ? ?Socioeconomic History  ? Marital status: Married  ?  Spouse name: [name]  ? Number of children: 4  ? Years of education: 12+  ? Highest education  level: Not on file  ?Occupational History  ? Occupation: Retired  ?Tobacco Use  ? Smoking status: Former  ?  Years: 15.00  ?  Types: Cigarettes  ?  Quit date: 30  ?  Years since quitting: 43.3  ? Smokeless tobacco: Never  ? Tobacco comments:  ?  12.2.19-quit at least 40 years ago   ?Vaping Use  ? Vaping Use: Never used  ?Substance and Sexual Activity  ? Alcohol use: No  ? Drug use: No  ? Sexual activity: Not Currently  ?Other Topics Concern  ? Not on file  ?Social History Narrative  ? Lives with wife  ? Caffeine use: Drinks decaf coffee and tea  ? Writes right-handed, eats and shaves left-handed  ? ?Social Determinants of Health  ? ?Financial Resource Strain: Not on file  ?Food Insecurity: Not on file  ?Transportation Needs: Not on file  ?Physical Activity: Not on file  ?Stress: Not on file  ?Social Connections: Not on file  ? ? ?Review of Systems  ?Constitutional:  Negative for chills and fever.  ?HENT:  Negative for congestion, rhinorrhea and sore throat.   ?Respiratory:  Negative for cough and shortness of breath.   ?Cardiovascular:  Negative for chest pain, palpitations and leg swelling.  ?Gastrointestinal:  Negative for abdominal pain, constipation, diarrhea, nausea and vomiting.  ?Genitourinary:  Negative for dysuria and urgency.  ?Musculoskeletal:  Negative for arthralgias, back pain and myalgias.  ?Neurological:  Negative for dizziness and headaches.  ?Psychiatric/Behavioral:  Negative for dysphoric mood. The patient is not nervous/anxious.   ?All other systems reviewed and are negative. ? ? ?Objective:  ?BP (!) 132/58   Pulse 78   Temp (!) 97.4 ?F (36.3 ?C)   Wt 234 lb (106.1 kg)   BMI 32.64 kg/m?  ? ? ?  03/17/2022  ?  1:35 PM 03/03/2022  ? 10:07 AM 02/17/2022  ?  9:36 AM  ?BP/Weight  ?Systolic BP 330 076 226  ?Diastolic BP 58 82 80  ?Wt. (Lbs) 234 232 229  ?BMI 32.64 kg/m2 32.36 kg/m2 32.17 kg/m2  ? ? ?Physical Exam ?Vitals reviewed.  ?Cardiovascular:  ?   Rate and Rhythm: Normal rate and regular  rhythm.  ?   Pulses: Normal pulses.  ?   Heart sounds: Normal heart sounds.  ?Pulmonary:  ?   Effort: Pulmonary effort is normal.  ?   Breath sounds: Normal breath sounds.  ?Skin: ?   General: Skin is warm and dry.  ?   Capillary Refill: Capillary refill takes less than 2 seconds.  ?Neurological:  ?   General: No focal deficit present.  ?   Mental Status: He  is alert and oriented to person, place, and time.  ? ? ? ?  ? ?Lab Results  ?Component Value Date  ? WBC 5.8 01/06/2022  ? HGB 16.1 01/06/2022  ? HCT 47.2 01/06/2022  ? PLT 143 (L) 01/06/2022  ? GLUCOSE 98 03/03/2022  ? CHOL 176 01/06/2022  ? TRIG 151 (H) 01/06/2022  ? HDL 44 01/06/2022  ? LDLCALC 105 (H) 01/06/2022  ? ALT 44 03/03/2022  ? AST 26 03/03/2022  ? NA 143 03/03/2022  ? K 3.5 03/03/2022  ? CL 102 03/03/2022  ? CREATININE 0.97 03/03/2022  ? BUN 15 03/03/2022  ? CO2 28 03/03/2022  ? INR 1.6 (H) 09/02/2020  ? HGBA1C 6.1 (H) 01/06/2022  ? ? ? ? ?Assessment & Plan:  ? ? 1. Essential hypertension-well controlled ?-continue Diovan 320 mg ?-continue Verapamil 120 mg BID ?-continue HCTZ 25 mg daily ?  ?Continue medications  ?Follow-up in 29-month, fasting or sooner if needed ? ?  ? ?Follow-up: 329-month fasting ? ?An After Visit Summary was printed and given to the patient. ? ?I, ShRip HarbourNP, have reviewed all documentation for this visit. The documentation on 03/17/22 for the exam, diagnosis, procedures, and orders are all accurate and complete.  ? ?Signed,  ?ShRip HarbourNP ?[phone]

## 2022-03-26 ENCOUNTER — Ambulatory Visit (INDEPENDENT_AMBULATORY_CARE_PROVIDER_SITE_OTHER): Payer: PPO | Admitting: Nurse Practitioner

## 2022-03-26 ENCOUNTER — Encounter: Payer: Self-pay | Admitting: Nurse Practitioner

## 2022-03-26 VITALS — BP 138/64 | HR 79 | Temp 98.2°F | Resp 18 | Wt 227.8 lb

## 2022-03-26 DIAGNOSIS — R051 Acute cough: Secondary | ICD-10-CM

## 2022-03-26 DIAGNOSIS — J018 Other acute sinusitis: Secondary | ICD-10-CM | POA: Diagnosis not present

## 2022-03-26 MED ORDER — AZITHROMYCIN 250 MG PO TABS: ORAL_TABLET | ORAL | 0 refills | Status: AC

## 2022-03-26 MED ORDER — PROMETHAZINE-DM 6.25-15 MG/5ML PO SYRP: 5.0000 mL | ORAL_SOLUTION | Freq: Four times a day (QID) | ORAL | 0 refills | Status: DC | PRN

## 2022-03-26 NOTE — Progress Notes (Signed)
? ?Acute Office Visit ? ?Subjective:  ? ? Patient ID: Jesse [name]., male    DOB: 1941-12-05, 81 y.o.   MRN: [id number] ? ?Chief Complaint  ?Patient presents with  ? Sore Throat  ? Cough  ? ? ?HPI: ?Patient is in today for  URI symptoms of cough, congestion, sore throat, shortness of breath, and elevated temperature (74F, yesterday). Onset of symptoms was 5-days ago. He tells me symptoms began after performing yard work without a mask last weekend. Treatment has included OTC Mucinex, sinus decongestant, allergy medications, Flonase, and saline nasal spray. Home COVID-19 test negative this morning. Pt has a past history of chronic allergic rhinitis. ? ?Past Medical History:  ?Diagnosis Date  ? Acute pulmonary embolism without acute cor pulmonale ([facility]) 09/02/2020  ? Arthritis   ? Degenerative lumbar spinal stenosis   ? Fatty liver   ? GERD (gastroesophageal reflux disease)   ? Headache   ? History of kidney stones   ? Hyperlipidemia   ? Hypertension   ? Mastodynia   ? Migraine without aura and without status migrainosus, not intractable   ? 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 Radiculopathy of thoracolumbar region 07/15/2018  ? Radiculopathy of thoracolumbar region 07/15/2018  ? ? ?Past Surgical History:  ?Procedure Laterality Date  ? ANTERIOR LATERAL LUMBAR FUSION WITH PERCUTANEOUS SCREW 2 LEVEL N/A 07/13/2018  ? Procedure: ANTERIOR LATERAL LUMBAR FUSION L2-4;  Surgeon: Melina Schools, MD;  Location: [facility];  Service: Orthopedics;  Laterality: N/A;  4 hrs  ? CHOLECYSTECTOMY  1999  ? GALLBLADDER SURGERY  2016  ? NECK SURGERY  1995  ? Ruptured disc repair  ? SHOULDER SURGERY Left 2005  ? SHOULDER SURGERY Right 2016  ? THROAT SURGERY    ? stretching  ? ? ?Family History  ?Problem Relation Age of Onset  ? Hypertension Mother   ? Cancer Father   ? Diabetes Brother   ? Migraines Neg Hx   ? ? ?Social History  ? ?Socioeconomic History  ? Marital status: Married  ?  Spouse name:  [name]  ? Number of children: 4  ? Years of education: 12+  ? Highest education level: Not on file  ?Occupational History  ? Occupation: Retired  ?Tobacco Use  ? Smoking status: Former  ?  Years: 15.00  ?  Types: Cigarettes  ?  Quit date: 73  ?  Years since quitting: 43.3  ? Smokeless tobacco: Never  ? Tobacco comments:  ?  12.2.19-quit at least 40 years ago   ?Vaping Use  ? Vaping Use: Never used  ?Substance and Sexual Activity  ? Alcohol use: No  ? Drug use: No  ? Sexual activity: Not Currently  ?Other Topics Concern  ? Not on file  ?Social History Narrative  ? Lives with wife  ? Caffeine use: Drinks decaf coffee and tea  ? Writes right-handed, eats and shaves left-handed  ? ?Social Determinants of Health  ? ?Financial Resource Strain: Not on file  ?Food Insecurity: Not on file  ?Transportation Needs: Not on file  ?Physical Activity: Not on file  ?Stress: Not on file  ?Social Connections: Not on file  ?Intimate Partner Violence: Not on file  ? ? ?Outpatient Medications Prior to Visit  ?Medication Sig Dispense Refill  ? acetaminophen (TYLENOL) 325 MG tablet Take 2 tablets (650 mg total) by mouth every 4 (four) hours as needed for mild pain, fever or headache.    ? Ascorbic  Acid (VITAMIN C) 1000 MG tablet Take 1,000 mg by mouth daily.    ? cholecalciferol (VITAMIN D3) 25 MCG (1000 UNIT) tablet Take 2,000 Units by mouth daily.    ? Fexofenadine HCl (ALLERGY 24-HR PO) Take by mouth.    ? fluticasone (FLONASE) 50 MCG/ACT nasal spray Place 2 sprays into both nostrils daily.    ? hydrochlorothiazide (HYDRODIURIL) 25 MG tablet Take 1 tablet (25 mg total) by mouth daily. 90 tablet 1  ? omeprazole (PRILOSEC) 20 MG capsule Take 20 mg by mouth daily before breakfast.     ? valsartan (DIOVAN) 320 MG tablet Take 1 tablet (320 mg total) by mouth daily. 90 tablet 0  ? verapamil (CALAN-SR) 120 MG CR tablet Take 1 tablet (120 mg total) by mouth 2 (two) times daily. 180 tablet 1  ? Zinc Sulfate (ZINC-220 PO) Take 1 capsule by mouth  daily.     ? ?Facility-Administered Medications Prior to Visit  ?Medication Dose Route Frequency Provider Last Rate Last Admin  ? triamcinolone acetonide (KENALOG-40) injection 40 mg  40 mg Intra-articular Once [name], MD      ? ? ?Allergies  ?Allergen Reactions  ? Penicillins Rash  ? ? ?Review of Systems  ?Constitutional:  Positive for fatigue. Negative for appetite change and fever.  ?HENT:  Positive for congestion, sinus pressure, sinus pain and sore throat. Negative for ear pain.   ?Eyes:  Negative for pain.  ?Respiratory:  Positive for cough and shortness of breath (on exertion). Negative for wheezing.   ?Cardiovascular:  Negative for chest pain and palpitations.  ?Gastrointestinal:  Negative for abdominal pain, constipation, diarrhea, nausea and vomiting.  ?Genitourinary:  Negative for dysuria and frequency.  ?Musculoskeletal:  Negative for arthralgias, back pain, joint swelling and myalgias.  ?Skin:  Negative for rash.  ?Allergic/Immunologic: Positive for environmental allergies.  ?Neurological:  Positive for dizziness, weakness and headaches.  ?Psychiatric/Behavioral:  Negative for dysphoric mood. The patient is not nervous/anxious.   ? ?   ?Objective:  ?  ?Physical Exam ?Constitutional:   ?   Appearance: Normal appearance. He is normal weight. He is ill-appearing.  ?HENT:  ?   Right Ear: Tympanic membrane normal.  ?   Left Ear: Tympanic membrane normal.  ?   Nose: Congestion and rhinorrhea present.  ?   Right Sinus: Maxillary sinus tenderness present.  ?   Left Sinus: Maxillary sinus tenderness present.  ?   Mouth/Throat:  ?   Pharynx: Posterior oropharyngeal erythema present.  ?Cardiovascular:  ?   Rate and Rhythm: Normal rate and regular rhythm.  ?   Pulses: Normal pulses.  ?   Heart sounds: Normal heart sounds.  ?Pulmonary:  ?   Effort: Pulmonary effort is normal.  ?   Breath sounds: Normal breath sounds.  ?Abdominal:  ?   General: Bowel sounds are normal.  ?Neurological:  ?   Mental Status: He is  alert and oriented to person, place, and time. Mental status is at baseline.  ?Psychiatric:     ?   Mood and Affect: Mood normal.     ?   Behavior: Behavior normal.  ? ? ?BP 138/64   Pulse 79   Temp 98.2 ?F (36.8 ?C)   Resp 18   Wt 227 lb 12.8 oz (103.3 kg)   SpO2 95%   BMI 31.77 kg/m?   ?Wt Readings from Last 3 Encounters:  ?03/26/22 227 lb 12.8 oz (103.3 kg)  ?03/17/22 234 lb (106.1 kg)  ?03/03/22 232 lb (  105.2 kg)  ? ? ?Health Maintenance Due  ?Topic Date Due  ? Zoster Vaccines- Shingrix (1 of 2) Never done  ? ? ?Lab Results  ?Component Value Date  ? WBC 5.8 01/06/2022  ? HGB 16.1 01/06/2022  ? HCT 47.2 01/06/2022  ? MCV 88 01/06/2022  ? PLT 143 (L) 01/06/2022  ? ?Lab Results  ?Component Value Date  ? NA 143 03/03/2022  ? K 3.5 03/03/2022  ? CO2 28 03/03/2022  ? GLUCOSE 98 03/03/2022  ? BUN 15 03/03/2022  ? CREATININE 0.97 03/03/2022  ? BILITOT 0.5 03/03/2022  ? ALKPHOS 72 03/03/2022  ? AST 26 03/03/2022  ? ALT 44 03/03/2022  ? PROT 6.6 03/03/2022  ? ALBUMIN 4.2 03/03/2022  ? CALCIUM 9.8 03/03/2022  ? ANIONGAP 7 09/03/2020  ? EGFR 79 03/03/2022  ? ?Lab Results  ?Component Value Date  ? CHOL 176 01/06/2022  ? ?Lab Results  ?Component Value Date  ? HDL 44 01/06/2022  ? ?Lab Results  ?Component Value Date  ? LDLCALC 105 (H) 01/06/2022  ? ?Lab Results  ?Component Value Date  ? TRIG 151 (H) 01/06/2022  ? ?Lab Results  ?Component Value Date  ? CHOLHDL 4.0 01/06/2022  ? ?Lab Results  ?Component Value Date  ? HGBA1C 6.1 (H) 01/06/2022  ? ? ?   ?Assessment & Plan:  ? ?1. Acute non-recurrent sinusitis of other sinus ?- azithromycin (ZITHROMAX) 250 MG tablet; Take 2 tablets on day 1, then 1 tablet daily on days 2 through 5  Dispense: 6 tablet; Refill: 0 ?- promethazine-dextromethorphan (PROMETHAZINE-DM) 6.25-15 MG/5ML syrup; Take 5 mLs by mouth 4 (four) times daily as needed.  Dispense: 118 mL; Refill: 0 ? ?2. Acute cough ?- promethazine-dextromethorphan (PROMETHAZINE-DM) 6.25-15 MG/5ML syrup; Take 5 mLs by mouth 4  (four) times daily as needed.  Dispense: 118 mL; Refill: 0 ?  ?Rest and push fluids ?Take Tylenol as needed for headache ?Continue allergy medications and nasal sprays ?Take z-pack as directed ?Take Delsym coug

## 2022-03-26 NOTE — Patient Instructions (Addendum)
Rest and push fluids ?Take Tylenol as needed for headache ?Continue allergy medications and nasal sprays ?Take z-pack as directed ?Take Delsym cough syrup as needed  ?May take Promethazine-DM as needed for cough, do not use power machinery or drive while taking this medication ?Follow-up as needed ? ?Sinus Infection, Adult ?A sinus infection is soreness and swelling (inflammation) of your sinuses. Sinuses are hollow spaces in the bones around your face. They are located: ?Around your eyes. ?In the middle of your forehead. ?Behind your nose. ?In your cheekbones. ?Your sinuses and nasal passages are lined with a fluid called mucus. Mucus drains out of your sinuses. Swelling can trap mucus in your sinuses. This lets germs (bacteria, virus, or fungus) grow, which leads to infection. Most of the time, this condition is caused by a virus. ?What are the causes? ?Allergies. ?Asthma. ?Germs. ?Things that block your nose or sinuses. ?Growths in the nose (nasal polyps). ?Chemicals or irritants in the air. ?A fungus. This is rare. ?What increases the risk? ?Having a weak body defense system (immune system). ?Doing a lot of swimming or diving. ?Using nasal sprays too much. ?Smoking. ?What are the signs or symptoms? ?The main symptoms of this condition are pain and a feeling of pressure around the sinuses. Other symptoms include: ?Stuffy nose (congestion). This may make it hard to breathe through your nose. ?Runny nose (drainage). ?Soreness, swelling, and warmth in the sinuses. ?A cough that may get worse at night. ?Being unable to smell and taste. ?Mucus that collects in the throat or the back of the nose (postnasal drip). This may cause a sore throat or bad breath. ?Being very tired (fatigued). ?A fever. ?How is this diagnosed? ?Your symptoms. ?Your medical history. ?A physical exam. ?Tests to find out if your condition is short-term (acute) or long-term (chronic). Your doctor may: ?Check your nose for growths (polyps). ?Check  your sinuses using a tool that has a light on one end (endoscope). ?Check for allergies or germs. ?Do imaging tests, such as an MRI or CT scan. ?How is this treated? ?Treatment for this condition depends on the cause and whether it is short-term or long-term. ?If caused by a virus, your symptoms should go away on their own within 10 days. You may be given medicines to relieve symptoms. They include: ?Medicines that shrink swollen tissue in the nose. ?A spray that treats swelling of the nostrils. ?Rinses that help get rid of thick mucus in your nose (nasal saline washes). ?Medicines that treat allergies (antihistamines). ?Over-the-counter pain relievers. ?If caused by bacteria, your doctor may wait to see if you will get better without treatment. You may be given antibiotic medicine if you have: ?A very bad infection. ?A weak body defense system. ?If caused by growths in the nose, surgery may be needed. ?Follow these instructions at home: ?Medicines ?Take, use, or apply over-the-counter and prescription medicines only as told by your doctor. These may include nasal sprays. ?If you were prescribed an antibiotic medicine, take it as told by your doctor. Do not stop taking it even if you start to feel better. ?Hydrate and humidify ? ?Drink enough water to keep your pee (urine) pale yellow. ?Use a cool mist humidifier to keep the humidity level in your home above 50%. ?Breathe in steam for 10-15 minutes, 3-4 times a day, or as told by your doctor. You can do this in the bathroom while a hot shower is running. ?Try not to spend time in cool or dry air. ?Rest ?Rest  as much as you can. ?Sleep with your head raised (elevated). ?Make sure you get enough sleep each night. ?General instructions ? ?Put a warm, moist washcloth on your face 3-4 times a day, or as often as told by your doctor. ?Use nasal saline washes as often as told by your doctor. ?Wash your hands often with soap and water. If you cannot use soap and water, use  hand sanitizer. ?Do not smoke. Avoid being around people who are smoking (secondhand smoke). ?Keep all follow-up visits. ?Contact a doctor if: ?You have a fever. ?Your symptoms get worse. ?Your symptoms do not get better within 10 days. ?Get help right away if: ?You have a very bad headache. ?You cannot stop vomiting. ?You have very bad pain or swelling around your face or eyes. ?You have trouble seeing. ?You feel confused. ?Your neck is stiff. ?You have trouble breathing. ?These symptoms may be an emergency. Get help right away. Call 911. ?Do not wait to see if the symptoms will go away. ?Do not drive yourself to the hospital. ?Summary ?A sinus infection is swelling of your sinuses. Sinuses are hollow spaces in the bones around your face. ?This condition is caused by tissues in your nose that become inflamed or swollen. This traps germs. These can lead to infection. ?If you were prescribed an antibiotic medicine, take it as told by your doctor. Do not stop taking it even if you start to feel better. ?Keep all follow-up visits. ?This information is not intended to replace advice given to you by your health care provider. Make sure you discuss any questions you have with your health care provider. ?Document Revised: 10/28/2021 Document Reviewed: 10/28/2021 ?Elsevier Patient Education ? 2023 [employer] ? ?

## 2022-05-03 ENCOUNTER — Other Ambulatory Visit: Payer: Self-pay | Admitting: Family Medicine

## 2022-05-03 DIAGNOSIS — I10 Essential (primary) hypertension: Secondary | ICD-10-CM

## 2022-05-06 ENCOUNTER — Ambulatory Visit (INDEPENDENT_AMBULATORY_CARE_PROVIDER_SITE_OTHER): Payer: PPO

## 2022-05-06 VITALS — Wt 224.0 lb

## 2022-05-06 DIAGNOSIS — Z0000 Encounter for general adult medical examination without abnormal findings: Secondary | ICD-10-CM

## 2022-05-06 DIAGNOSIS — Z6831 Body mass index (BMI) 31.0-31.9, adult: Secondary | ICD-10-CM

## 2022-05-06 NOTE — Patient Instructions (Signed)
Jesse [name] , Thank you for taking time to come for your Medicare Wellness Visit. I appreciate your ongoing commitment to your health goals. Please review the following plan we discussed and let me know if I can assist you in the future.   Screening recommendations/referrals: Recommended yearly ophthalmology/optometry visit for glaucoma screening and checkup Recommended yearly dental visit for hygiene and checkup  Vaccinations: Influenza vaccine: Due Fall 2023 Pneumococcal vaccine: Complete Tdap vaccine: Due 2024 Shingles vaccine: I cannot find record of you having this vaccine - please contact the pharmacy or health department you received it from and have them fax over the record to [phone]  Advanced directives: Please bring a copy for your medical record   Preventive Care 65 Years and Older, Male Preventive care refers to lifestyle choices and visits with your health care provider that can promote health and wellness. What does preventive care include? A yearly physical exam. This is also called an annual well check. Dental exams once or twice a year. Routine eye exams. Ask your health care provider how often you should have your eyes checked. Personal lifestyle choices, including: Daily care of your teeth and gums. Regular physical activity. Eating a healthy diet. Avoiding tobacco and drug use. Limiting alcohol use. Practicing safe sex. Taking low doses of aspirin every day. Taking vitamin and mineral supplements as recommended by your health care provider. What happens during an annual well check? The services and screenings done by your health care provider during your annual well check will depend on your age, overall health, lifestyle risk factors, and family history of disease. Counseling  Your health care provider may ask you questions about your: Alcohol use. Tobacco use. Drug use. Emotional well-being. Home and relationship well-being. Sexual activity. Eating  habits. History of falls. Memory and ability to understand (cognition). Work and work Astronomer. Screening  You may have the following tests or measurements: Height, weight, and BMI. Blood pressure. Lipid and cholesterol levels. These may be checked every 5 years, or more frequently if you are over 68 years old. Skin check. Lung cancer screening. You may have this screening every year starting at age 81 if you have a 30-pack-year history of smoking and currently smoke or have quit within the past 15 years. Fecal occult blood test (FOBT) of the stool. You may have this test every year starting at age 81. Flexible sigmoidoscopy or colonoscopy. You may have a sigmoidoscopy every 5 years or a colonoscopy every 10 years starting at age 81. Prostate cancer screening. Recommendations will vary depending on your family history and other risks. Hepatitis C blood test. Hepatitis B blood test. Sexually transmitted disease (STD) testing. Diabetes screening. This is done by checking your blood sugar (glucose) after you have not eaten for a while (fasting). You may have this done every 1-3 years. Abdominal aortic aneurysm (AAA) screening. You may need this if you are a current or former smoker. Osteoporosis. You may be screened starting at age 81 if you are at high risk. Talk with your health care provider about your test results, treatment options, and if necessary, the need for more tests. Vaccines  Your health care provider may recommend certain vaccines, such as: Influenza vaccine. This is recommended every year. Tetanus, diphtheria, and acellular pertussis (Tdap, Td) vaccine. You may need a Td booster every 10 years. Zoster vaccine. You may need this after age 40. Pneumococcal 13-valent conjugate (PCV13) vaccine. One dose is recommended after age 81. Pneumococcal polysaccharide (PPSV23) vaccine. One dose is  recommended after age 70. Talk to your health care provider about which screenings and  vaccines you need and how often you need them. This information is not intended to replace advice given to you by your health care provider. Make sure you discuss any questions you have with your health care provider. Document Released: 12/20/2015 Document Revised: 08/12/2016 Document Reviewed: 09/24/2015 Elsevier Interactive Patient Education  2017 [place] Prevention in the Home Falls can cause injuries. They can happen to people of all ages. There are many things you can do to make your home safe and to help prevent falls. What can I do on the outside of my home? Regularly fix the edges of walkways and driveways and fix any cracks. Remove anything that might make you trip as you walk through a door, such as a raised step or threshold. Trim any bushes or trees on the path to your home. Use bright outdoor lighting. Clear any walking paths of anything that might make someone trip, such as rocks or tools. Regularly check to see if handrails are loose or broken. Make sure that both sides of any steps have handrails. Any raised decks and porches should have guardrails on the edges. Have any leaves, snow, or ice cleared regularly. Use sand or salt on walking paths during winter. Clean up any spills in your garage right away. This includes oil or grease spills. What can I do in the bathroom? Use night lights. Install grab bars by the toilet and in the tub and shower. Do not use towel bars as grab bars. Use non-skid mats or decals in the tub or shower. If you need to sit down in the shower, use a plastic, non-slip stool. Keep the floor dry. Clean up any water that spills on the floor as soon as it happens. Remove soap buildup in the tub or shower regularly. Attach bath mats securely with double-sided non-slip rug tape. Do not have throw rugs and other things on the floor that can make you trip. What can I do in the bedroom? Use night lights. Make sure that you have a light by your  bed that is easy to reach. Do not use any sheets or blankets that are too big for your bed. They should not hang down onto the floor. Have a firm chair that has side arms. You can use this for support while you get dressed. Do not have throw rugs and other things on the floor that can make you trip. What can I do in the kitchen? Clean up any spills right away. Avoid walking on wet floors. Keep items that you use a lot in easy-to-reach places. If you need to reach something above you, use a strong step stool that has a grab bar. Keep electrical cords out of the way. Do not use floor polish or wax that makes floors slippery. If you must use wax, use non-skid floor wax. Do not have throw rugs and other things on the floor that can make you trip. What can I do with my stairs? Do not leave any items on the stairs. Make sure that there are handrails on both sides of the stairs and use them. Fix handrails that are broken or loose. Make sure that handrails are as long as the stairways. Check any carpeting to make sure that it is firmly attached to the stairs. Fix any carpet that is loose or worn. Avoid having throw rugs at the top or bottom of the stairs. If  you do have throw rugs, attach them to the floor with carpet tape. Make sure that you have a light switch at the top of the stairs and the bottom of the stairs. If you do not have them, ask someone to add them for you. What else can I do to help prevent falls? Wear shoes that: Do not have high heels. Have rubber bottoms. Are comfortable and fit you well. Are closed at the toe. Do not wear sandals. If you use a stepladder: Make sure that it is fully opened. Do not climb a closed stepladder. Make sure that both sides of the stepladder are locked into place. Ask someone to hold it for you, if possible. Clearly mark and make sure that you can see: Any grab bars or handrails. First and last steps. Where the edge of each step is. Use tools that  help you move around (mobility aids) if they are needed. These include: Canes. Walkers. Scooters. Crutches. Turn on the lights when you go into a dark area. Replace any light bulbs as soon as they burn out. Set up your furniture so you have a clear path. Avoid moving your furniture around. If any of your floors are uneven, fix them. If there are any pets around you, be aware of where they are. Review your medicines with your doctor. Some medicines can make you feel dizzy. This can increase your chance of falling. Ask your doctor what other things that you can do to help prevent falls. This information is not intended to replace advice given to you by your health care provider. Make sure you discuss any questions you have with your health care provider. Document Released: 09/19/2009 Document Revised: 04/30/2016 Document Reviewed: 12/28/2014 Elsevier Interactive Patient Education  2017 [employer].

## 2022-05-06 NOTE — Progress Notes (Signed)
Subjective:   Jesse [name]. is a 81 y.o. male who presents for Medicare Annual/Subsequent preventive examination.  I connected with  Jesse [name]. on 05/06/22 by a audio enabled telemedicine application and verified that I am speaking with the correct person using two identifiers.  Patient Location: Home  Provider Location: Office/Clinic  I discussed the limitations of evaluation and management by telemedicine. The patient expressed understanding and agreed to proceed.   Cardiac Risk Factors include: male gender;obesity (BMI >30kg/m2);hypertension     Objective:    Today's Vitals   05/06/22 0911  Weight: 224 lb (101.6 kg)   Body mass index is 31.24 kg/m.     09/02/2020    7:05 PM 09/02/2020    6:14 PM 06/19/2020    2:45 PM 07/13/2018    7:15 PM 07/06/2018   10:07 AM  Advanced Directives  Does Patient Have a Medical Advance Directive? Yes Unable to assess, patient is non-responsive or altered mental status Yes Yes Yes  Type of Advance Directive Healthcare Power of [place];Living will  Healthcare Power of [place];Living will Living will Living will  Does patient want to make changes to medical advance directive? No - Patient declined  No - Patient declined No - Patient declined No - Patient declined  Copy of Healthcare Power of Attorney in Chart? No - copy requested  No - copy requested      Current Medications (verified) Outpatient Encounter Medications as of 05/06/2022  Medication Sig   acetaminophen (TYLENOL) 325 MG tablet Take 2 tablets (650 mg total) by mouth every 4 (four) hours as needed for mild pain, fever or headache.   Ascorbic Acid (VITAMIN C) 1000 MG tablet Take 1,000 mg by mouth daily.   cholecalciferol (VITAMIN D3) 25 MCG (1000 UNIT) tablet Take 2,000 Units by mouth daily.   Fexofenadine HCl (ALLERGY 24-HR PO) Take by mouth.   fluticasone (FLONASE) 50 MCG/ACT nasal spray Place 2 sprays into both nostrils daily.   hydrochlorothiazide (HYDRODIURIL) 25  MG tablet TAKE 1 TABLET (25 MG TOTAL) BY MOUTH DAILY.   omeprazole (PRILOSEC) 20 MG capsule Take 20 mg by mouth daily before breakfast.    promethazine-dextromethorphan (PROMETHAZINE-DM) 6.25-15 MG/5ML syrup Take 5 mLs by mouth 4 (four) times daily as needed.   valsartan (DIOVAN) 320 MG tablet Take 1 tablet (320 mg total) by mouth daily.   verapamil (CALAN-SR) 120 MG CR tablet Take 1 tablet (120 mg total) by mouth 2 (two) times daily.   Zinc Sulfate (ZINC-220 PO) Take 1 capsule by mouth daily.    Facility-Administered Encounter Medications as of 05/06/2022  Medication   triamcinolone acetonide (KENALOG-40) injection 40 mg    Allergies (verified) Penicillins   History: Past Medical History:  Diagnosis Date   Acute pulmonary embolism without acute cor pulmonale (HCC) 09/02/2020   Arthritis    Degenerative lumbar spinal stenosis    Fatty liver    GERD (gastroesophageal reflux disease)    Headache    History of kidney stones    Hyperlipidemia    Hypertension    Mastodynia    Migraine without aura and without status migrainosus, not intractable    Neuritis or radiculitis due to rupture of lumbar intervertebral disc 07/15/2018   Radiculopathy of thoracolumbar region 07/15/2018   Radiculopathy of thoracolumbar region 07/15/2018   Past Surgical History:  Procedure Laterality Date   ANTERIOR LATERAL LUMBAR FUSION WITH PERCUTANEOUS SCREW 2 LEVEL N/A 07/13/2018   Procedure: ANTERIOR LATERAL LUMBAR FUSION L2-4;  Surgeon:  Venita Lick, MD;  Location: [facility] OR;  Service: Orthopedics;  Laterality: N/A;  4 hrs   CHOLECYSTECTOMY  1999   GALLBLADDER SURGERY  2016   NECK SURGERY  1995   Ruptured disc repair   SHOULDER SURGERY Left 2005   SHOULDER SURGERY Right 2016   THROAT SURGERY     stretching   Family History  Problem Relation Age of Onset   Hypertension Mother    Cancer Father    Heart disease Sister    Diabetes Brother    Migraines Neg Hx    Social History   Socioeconomic History    Marital status: Married    Spouse name: Darel Hong   Number of children: 4   Years of education: 12+   Highest education level: Not on file  Occupational History   Occupation: Retired  Tobacco Use   Smoking status: Former    Years: 15.00    Types: Cigarettes    Quit date: 1980    Years since quitting: 43.4   Smokeless tobacco: Never   Tobacco comments:    quit at least 42 years ago   Psychologist, educational Use   Vaping Use: Never used  Substance and Sexual Activity   Alcohol use: No   Drug use: No   Sexual activity: Not Currently  Other Topics Concern   Not on file  Social History Narrative   Lives with wife   Caffeine use: Drinks decaf coffee and tea   Writes right-handed, eats and shaves left-handed   Social Determinants of Health   Financial Resource Strain: Low Risk    Difficulty of Paying Living Expenses: Not hard at all  Food Insecurity: No Food Insecurity   Worried About Programme researcher, broadcasting/film/video in the Last Year: Never true   Barista in the Last Year: Never true  Transportation Needs: No Transportation Needs   Lack of Transportation (Medical): No   Lack of Transportation (Non-Medical): No  Physical Activity: Insufficiently Active   Days of Exercise per Week: 3 days   Minutes of Exercise per Session: 30 min  Stress: No Stress Concern Present   Feeling of Stress : Not at all  Social Connections: Not on file    Tobacco Counseling Counseling given: Not Answered Tobacco comments: quit at least 42 years ago   Clinical Intake: Pre-visit preparation completed: Yes Pain : No/denies pain   BMI - recorded: 31.24 Nutritional Status: BMI > 30  Obese Nutritional Risks: None Diabetes:  (A1C 6.1) How often do you need to have someone help you when you read instructions, pamphlets, or other written materials from your doctor or pharmacy?: 1 - Never Interpreter Needed?: No     Activities of Daily Living    05/06/2022    9:18 AM 09/09/2021    1:57 PM  In your present state of  health, do you have any difficulty performing the following activities:  Hearing? 0 0  Vision? 0 0  Difficulty concentrating or making decisions? 0 0  Walking or climbing stairs? 0 0  Dressing or bathing? 0 0  Doing errands, shopping? 0 0  Preparing Food and eating ? N   Using the Toilet? N   In the past six months, have you accidently leaked urine? N   Do you have problems with loss of bowel control? N   Managing your Medications? N   Managing your Finances? N   Housekeeping or managing your Housekeeping? N     Patient Care Team: Blane Ohara,  MD as PCP - General (Family Medicine) Misenheimer, Marcial Pacasimothy, MD as Consulting Physician (Unknown Physician Specialty)     Assessment:   This is a routine wellness examination for Jesse [name].  Dietary issues and exercise activities discussed: Current Exercise Habits: Home exercise routine, Type of exercise: walking, Time (Minutes): 30, Frequency (Times/Week): 3, Weekly Exercise (Minutes/Week): 90, Intensity: Mild, Exercise limited by: None identified   Goals Addressed             This Visit's Progress    HEMOGLOBIN A1C < 7       Weight (lb) < 200 lb (90.7 kg)   224 lb (101.6 kg)      Depression Screen    05/06/2022    9:15 AM 01/06/2022    8:09 AM 11/04/2021    3:48 PM 04/01/2021    8:42 AM 01/02/2021    9:31 AM 06/19/2020    2:46 PM 05/14/2020    3:31 PM  PHQ 2/9 Scores  PHQ - 2 Score 0 0 0 0 0 0 0    Fall Risk    05/06/2022    9:17 AM 01/06/2022    8:09 AM 11/04/2021    3:48 PM 04/01/2021    8:42 AM 01/02/2021    9:31 AM  Fall Risk   Falls in the past year? 0 0 0 1 0  Number falls in past yr: 0 0 0 1 0  Injury with Fall? 0 0 0 0 0  Risk for fall due to : No Fall Risks  History of fall(s)    Follow up Falls evaluation completed;Education provided Falls evaluation completed       FALL RISK PREVENTION PERTAINING TO THE HOME:  Any stairs in or around the home? Yes  If so, are there any without handrails? No  Home free of  loose throw rugs in walkways, pet beds, electrical cords, etc? Yes  Adequate lighting in your home to reduce risk of falls? Yes   ASSISTIVE DEVICES UTILIZED TO PREVENT FALLS:  Life alert? No  Use of a cane, walker or w/c? No  Grab bars in the bathroom? Yes  Shower chair or bench in shower? Yes  Elevated toilet seat or a handicapped toilet? No   Cognitive Function:        05/06/2022    9:19 AM 06/19/2020    2:48 PM  6CIT Screen  What Year? 0 points 0 points  What month? 0 points 0 points  What time? 0 points 0 points  Count back from 20 0 points 0 points  Months in reverse 0 points 2 points  Repeat phrase 0 points 0 points  Total Score 0 points 2 points    Immunizations Immunization History  Administered Date(s) Administered   Fluad Quad(high Dose 65+) 10/01/2020, 09/09/2021   Influenza, High Dose Seasonal PF 10/20/2018, 10/06/2019   Influenza-Unspecified 10/26/2018, 09/06/2021   PFIZER Comirnaty(Gray Top)Covid-19 Tri-Sucrose Vaccine 04/01/2021   PFIZER(Purple Top)SARS-COV-2 Vaccination 12/22/2019, 01/15/2020   Pfizer Covid-19 Vaccine Bivalent Booster 6267yrs & up 09/09/2021   Pneumococcal Conjugate-13 09/23/2015   Pneumococcal Polysaccharide-23 06/17/2009   Tdap 07/25/2013    TDAP status: Up to date  Flu Vaccine status: Up to date  Pneumococcal vaccine status: Up to date  Covid-19 vaccine status: Completed vaccines  Qualifies for Shingles Vaccine? Yes   Shingrix Completed?: No.    Education has been provided regarding the importance of this vaccine. Patient has been advised to call insurance company to determine out of pocket expense if they have  not yet received this vaccine. Advised may also receive vaccine at local pharmacy or Health Dept. Verbalized acceptance and understanding.  Screening Tests Health Maintenance  Topic Date Due   Zoster Vaccines- Shingrix (1 of 2) Never done   INFLUENZA VACCINE  07/07/2022   TETANUS/TDAP  07/26/2023   Pneumonia Vaccine 47+  Years old  Completed   COVID-19 Vaccine  Completed   HPV VACCINES  Aged Out    Health Maintenance  Health Maintenance Due  Topic Date Due   Zoster Vaccines- Shingrix (1 of 2) Never done    Colorectal cancer screening: No longer required.   Lung Cancer Screening: (Low Dose CT Chest recommended if Age 64-80 years, 30 pack-year currently smoking OR have quit w/in 15years.) does not qualify.    Additional Screening:  Vision Screening: Recommended annual ophthalmology exams for early detection of glaucoma and other disorders of the eye. Is the patient up to date with their annual eye exam?  Yes  Who is the provider or what is the name of the office in which the patient attends annual eye exams? [facility]  Dental Screening: Recommended annual dental exams for proper oral hygiene     Plan:    1- Increase exercise and make diet improvements to lower A1C and improve cardiovascular health 2- Patient reports that BP has improved and is <140 3- Shingrix Vaccine - I cannot find records of this vaccine  I have personally reviewed and noted the following in the patient's chart:   Medical and social history Use of alcohol, tobacco or illicit drugs  Current medications and supplements including opioid prescriptions.  Functional ability and status Nutritional status Physical activity Advanced directives List of other physicians Hospitalizations, surgeries, and ER visits in previous 12 months Vitals Screenings to include cognitive, depression, and falls Referrals and appointments  In addition, I have reviewed and discussed with patient certain preventive protocols, quality metrics, and best practice recommendations. A written personalized care plan for preventive services as well as general preventive health recommendations were provided to patient.     Jacklynn Bue, LPN   1/60/7371

## 2022-05-07 ENCOUNTER — Encounter: Payer: PPO | Admitting: Family Medicine

## 2022-05-16 ENCOUNTER — Other Ambulatory Visit: Payer: Self-pay | Admitting: Nurse Practitioner

## 2022-05-16 DIAGNOSIS — I10 Essential (primary) hypertension: Secondary | ICD-10-CM

## 2022-06-16 DIAGNOSIS — H35032 Hypertensive retinopathy, left eye: Secondary | ICD-10-CM | POA: Diagnosis not present

## 2022-06-16 DIAGNOSIS — H353130 Nonexudative age-related macular degeneration, bilateral, stage unspecified: Secondary | ICD-10-CM | POA: Diagnosis not present

## 2022-06-16 DIAGNOSIS — H25813 Combined forms of age-related cataract, bilateral: Secondary | ICD-10-CM | POA: Diagnosis not present

## 2022-06-16 DIAGNOSIS — H43393 Other vitreous opacities, bilateral: Secondary | ICD-10-CM | POA: Diagnosis not present

## 2022-06-16 DIAGNOSIS — H524 Presbyopia: Secondary | ICD-10-CM | POA: Diagnosis not present

## 2022-06-17 ENCOUNTER — Encounter: Payer: Self-pay | Admitting: Family Medicine

## 2022-06-17 ENCOUNTER — Ambulatory Visit (INDEPENDENT_AMBULATORY_CARE_PROVIDER_SITE_OTHER): Payer: PPO | Admitting: Family Medicine

## 2022-06-17 VITALS — BP 124/64 | HR 72 | Temp 97.1°F | Resp 16 | Ht 71.0 in | Wt 236.0 lb

## 2022-06-17 DIAGNOSIS — E6609 Other obesity due to excess calories: Secondary | ICD-10-CM

## 2022-06-17 DIAGNOSIS — I10 Essential (primary) hypertension: Secondary | ICD-10-CM | POA: Diagnosis not present

## 2022-06-17 DIAGNOSIS — R7303 Prediabetes: Secondary | ICD-10-CM

## 2022-06-17 DIAGNOSIS — R1319 Other dysphagia: Secondary | ICD-10-CM

## 2022-06-17 DIAGNOSIS — E782 Mixed hyperlipidemia: Secondary | ICD-10-CM

## 2022-06-17 DIAGNOSIS — Z6832 Body mass index (BMI) 32.0-32.9, adult: Secondary | ICD-10-CM

## 2022-06-17 DIAGNOSIS — E785 Hyperlipidemia, unspecified: Secondary | ICD-10-CM | POA: Insufficient documentation

## 2022-06-17 MED ORDER — VALSARTAN-HYDROCHLOROTHIAZIDE 320-25 MG PO TABS: 1.0000 | ORAL_TABLET | Freq: Every day | ORAL | 0 refills | Status: DC

## 2022-06-17 NOTE — Patient Instructions (Addendum)
Recommend shingrix vaccine Start on combination valsartan/HCTZ 320/25 mg daily, when individual prescriptions for valsartan and HCTZ run out. Come tomorrow morning for lab work.

## 2022-06-17 NOTE — Progress Notes (Signed)
Subjective:  Patient ID: Jesse [name]., male    DOB: 04/01/1941  Age: 81 y.o. MRN: [id number]  Chief Complaint  Patient presents with   Hypertension   Gastroesophageal Reflux    HPI Prediabetes: Patient is not taking any medication, but he is eating healthy and low carbs diet. Exercising.   Hypertension: He is taking HYDROCHLOROTHIAZIDE 25 mg daily, verapamil 120 mg at bedtime, and valsartan 320 mg daily. 120-150/60-80s. Asymptomatic except feels nervous and will check it.   GERD: He takes Omeprazole 20 mg daily. Swallowing issues. Liquid sometimes worse than a solid. Esophageal stricture in the past dilated by Dr. Jennye Boroughs.   Allergic rhinitis: allegra and flonase.   Hyperlipidemia: not currently on medicine. LDL mildly elevated. He eats healthy.   Current Outpatient Medications on File Prior to Visit  Medication Sig Dispense Refill   acetaminophen (TYLENOL) 325 MG tablet Take 2 tablets (650 mg total) by mouth every 4 (four) hours as needed for mild pain, fever or headache.     Ascorbic Acid (VITAMIN C) 1000 MG tablet Take 1,000 mg by mouth daily.     cholecalciferol (VITAMIN D3) 25 MCG (1000 UNIT) tablet Take 2,000 Units by mouth daily.     Fexofenadine HCl (ALLERGY 24-HR PO) Take by mouth.     fluticasone (FLONASE) 50 MCG/ACT nasal spray Place 2 sprays into both nostrils daily.     omeprazole (PRILOSEC) 20 MG capsule Take 20 mg by mouth daily before breakfast.      verapamil (CALAN-SR) 120 MG CR tablet Take 1 tablet (120 mg total) by mouth 2 (two) times daily. 180 tablet 1   Zinc Sulfate (ZINC-220 PO) Take 1 capsule by mouth daily.      Current Facility-Administered Medications on File Prior to Visit  Medication Dose Route Frequency Provider Last Rate Last Admin   triamcinolone acetonide (KENALOG-40) injection 40 mg  40 mg Intra-articular Once CoxFritzi Mandes, MD       Past Medical History:  Diagnosis Date   Acute pulmonary embolism without acute cor pulmonale (HCC)  09/02/2020   Arthritis    Degenerative lumbar spinal stenosis    Fatty liver    GERD (gastroesophageal reflux disease)    Headache    History of kidney stones    Hyperlipidemia    Hypertension    Mastodynia    Migraine without aura and without status migrainosus, not intractable    Neuritis or radiculitis due to rupture of lumbar intervertebral disc 07/15/2018   Radiculopathy of thoracolumbar region 07/15/2018   Radiculopathy of thoracolumbar region 07/15/2018   Past Surgical History:  Procedure Laterality Date   ANTERIOR LATERAL LUMBAR FUSION WITH PERCUTANEOUS SCREW 2 LEVEL N/A 07/13/2018   Procedure: ANTERIOR LATERAL LUMBAR FUSION L2-4;  Surgeon: Venita Lick, MD;  Location: [facility] OR;  Service: Orthopedics;  Laterality: N/A;  4 hrs   CHOLECYSTECTOMY  1999   GALLBLADDER SURGERY  2016   NECK SURGERY  1995   Ruptured disc repair   SHOULDER SURGERY Left 2005   SHOULDER SURGERY Right 2016   THROAT SURGERY     stretching    Family History  Problem Relation Age of Onset   Hypertension Mother    Cancer Father    Heart disease Sister    Diabetes Brother    Migraines Neg Hx    Social History   Socioeconomic History   Marital status: Married    Spouse name: Darel Hong   Number of children: 4   Years of education: 12+  Highest education level: Not on file  Occupational History   Occupation: Retired  Tobacco Use   Smoking status: Former    Years: 15.00    Types: Cigarettes    Quit date: 1980    Years since quitting: 43.5   Smokeless tobacco: Never   Tobacco comments:    quit at least 42 years ago   Vaping Use   Vaping Use: Never used  Substance and Sexual Activity   Alcohol use: No   Drug use: No   Sexual activity: Not Currently  Other Topics Concern   Not on file  Social History Narrative   Lives with wife   Caffeine use: Drinks decaf coffee and tea   Writes right-handed, eats and shaves left-handed   Social Determinants of Health   Financial Resource Strain: Low Risk   (05/06/2022)   Overall Financial Resource Strain (CARDIA)    Difficulty of Paying Living Expenses: Not hard at all  Food Insecurity: No Food Insecurity (05/06/2022)   Hunger Vital Sign    Worried About Running Out of Food in the Last Year: Never true    Ran Out of Food in the Last Year: Never true  Transportation Needs: No Transportation Needs (05/06/2022)   PRAPARE - Administrator, Civil Service (Medical): No    Lack of Transportation (Non-Medical): No  Physical Activity: Insufficiently Active (05/06/2022)   Exercise Vital Sign    Days of Exercise per Week: 3 days    Minutes of Exercise per Session: 30 min  Stress: No Stress Concern Present (05/06/2022)   [employer] of Occupational Health - Occupational Stress Questionnaire    Feeling of Stress : Not at all  Social Connections: Not on file    Review of Systems  Constitutional:  Positive for fatigue. Negative for chills and fever.  HENT:  Negative for congestion, rhinorrhea and sore throat.   Respiratory:  Negative for cough and shortness of breath.   Cardiovascular:  Negative for chest pain and palpitations.  Gastrointestinal:  Negative for abdominal pain, constipation, diarrhea, nausea and vomiting.       Difficulty swallowing   Genitourinary:  Positive for frequency. Negative for dysuria and urgency.  Musculoskeletal:  Positive for arthralgias (diffuse joint pain), back pain and myalgias.  Neurological:  Positive for headaches. Negative for dizziness.  Psychiatric/Behavioral:  Negative for dysphoric mood. The patient is not nervous/anxious.      Objective:  BP 124/64   Pulse 72   Temp (!) 97.1 F (36.2 C)   Resp 16   Ht 5\' 11"  (1.803 m)   Wt 236 lb (107 kg)   BMI 32.92 kg/m      06/17/2022   10:21 AM 05/06/2022    9:11 AM 03/26/2022   10:38 AM  BP/Weight  Systolic BP 124  03/28/2022  Diastolic BP 64  64  Wt. (Lbs) 236 224 227.8  BMI 32.92 kg/m2 31.24 kg/m2 31.77 kg/m2    Physical Exam Vitals  reviewed.  Constitutional:      Appearance: Normal appearance.  Neck:     Vascular: No carotid bruit.  Cardiovascular:     Rate and Rhythm: Normal rate and regular rhythm.     Pulses: Normal pulses.     Heart sounds: Normal heart sounds.  Pulmonary:     Effort: Pulmonary effort is normal.     Breath sounds: Normal breath sounds. No wheezing, rhonchi or rales.  Abdominal:     General: Bowel sounds are normal.  Palpations: Abdomen is soft.     Tenderness: There is no abdominal tenderness.  Neurological:     Mental Status: He is alert and oriented to person, place, and time.  Psychiatric:        Mood and Affect: Mood normal.        Behavior: Behavior normal.     Diabetic Foot Exam - Simple   No data filed      Lab Results  Component Value Date   WBC 4.5 06/18/2022   HGB 14.5 06/18/2022   HCT 43.0 06/18/2022   PLT 150 06/18/2022   GLUCOSE 92 06/18/2022   CHOL 177 06/18/2022   TRIG 191 (H) 06/18/2022   HDL 38 (L) 06/18/2022   LDLCALC 106 (H) 06/18/2022   ALT 35 06/18/2022   AST 26 06/18/2022   NA 143 06/18/2022   K 3.2 (L) 06/18/2022   CL 102 06/18/2022   CREATININE 1.06 06/18/2022   BUN 14 06/18/2022   CO2 27 06/18/2022   INR 1.6 (H) 09/02/2020   HGBA1C 5.7 (H) 06/18/2022      Assessment & Plan:   Problem List Items Addressed This Visit       Cardiovascular and Mediastinum   Essential hypertension - Primary    Well controlled.  Start on combination valsartan/HCTZ 320/25 mg daily, when individual prescriptions for valsartan and HCTZ run out. Come tomorrow morning for lab work. Continue to work on eating a healthy diet and exercise.  Labs drawn today.        Relevant Medications   valsartan-hydrochlorothiazide (DIOVAN-HCT) 320-25 MG tablet   Other Relevant Orders   CBC with Differential/Platelet (Completed)   Comprehensive metabolic panel (Completed)     Digestive   Esophageal dysphagia    Refer to gi      Relevant Orders   Ambulatory  referral to Gastroenterology     Other   Prediabetes    Recommend continue to work on eating healthy diet and exercise.       Relevant Orders   Hemoglobin A1c (Completed)   Mixed hyperlipidemia    Not at goal. Start crestor 10 mg before bed.  Recommend continue to work on eating healthy diet and exercise. Check lab in the morning.       Relevant Medications   valsartan-hydrochlorothiazide (DIOVAN-HCT) 320-25 MG tablet   Other Relevant Orders   Lipid panel (Completed)   Class 1 obesity due to excess calories with serious comorbidity and body mass index (BMI) of 32.0 to 32.9 in adult    Recommend continue to work on eating healthy diet and exercise.      .  Meds ordered this encounter  Medications   valsartan-hydrochlorothiazide (DIOVAN-HCT) 320-25 MG tablet    Sig: Take 1 tablet by mouth daily.    Dispense:  90 tablet    Refill:  0    Put on file. Please cancel all separate rxs for valsartan and hydrochlorothiazide    Orders Placed This Encounter  Procedures   CBC with Differential/Platelet   Comprehensive metabolic panel   Hemoglobin A1c   Lipid panel   Ambulatory referral to Gastroenterology     Follow-up: Return in about 4 months (around 10/18/2022) for chronic fasting.  An After Visit Summary was printed and given to the patient.  Blane Ohara, MD [facility] [phone]

## 2022-06-18 ENCOUNTER — Other Ambulatory Visit: Payer: PPO

## 2022-06-18 ENCOUNTER — Other Ambulatory Visit: Payer: Self-pay

## 2022-06-18 DIAGNOSIS — R7303 Prediabetes: Secondary | ICD-10-CM | POA: Diagnosis not present

## 2022-06-18 DIAGNOSIS — I10 Essential (primary) hypertension: Secondary | ICD-10-CM

## 2022-06-18 DIAGNOSIS — E782 Mixed hyperlipidemia: Secondary | ICD-10-CM

## 2022-06-19 ENCOUNTER — Other Ambulatory Visit: Payer: Self-pay

## 2022-06-19 LAB — COMPREHENSIVE METABOLIC PANEL
ALT: 35 IU/L (ref 0–44)
AST: 26 IU/L (ref 0–40)
Albumin/Globulin Ratio: 1.8 (ref 1.2–2.2)
Albumin: 4.4 g/dL (ref 3.8–4.8)
Alkaline Phosphatase: 70 IU/L (ref 44–121)
BUN/Creatinine Ratio: 13 (ref 10–24)
BUN: 14 mg/dL (ref 8–27)
Bilirubin Total: 0.5 mg/dL (ref 0.0–1.2)
CO2: 27 mmol/L (ref 20–29)
Calcium: 9.8 mg/dL (ref 8.6–10.2)
Chloride: 102 mmol/L (ref 96–106)
Creatinine, Ser: 1.06 mg/dL (ref 0.76–1.27)
Globulin, Total: 2.5 g/dL (ref 1.5–4.5)
Glucose: 92 mg/dL (ref 70–99)
Potassium: 3.2 mmol/L — ABNORMAL LOW (ref 3.5–5.2)
Sodium: 143 mmol/L (ref 134–144)
Total Protein: 6.9 g/dL (ref 6.0–8.5)
eGFR: 71 mL/min/{1.73_m2} (ref >59)

## 2022-06-19 LAB — CBC WITH DIFFERENTIAL/PLATELET
Basophils Absolute: 0.1 10*3/uL (ref 0.0–0.2)
Basos: 1 %
EOS (ABSOLUTE): 0.1 10*3/uL (ref 0.0–0.4)
Eos: 3 %
Hematocrit: 43 % (ref 37.5–51.0)
Hemoglobin: 14.5 g/dL (ref 13.0–17.7)
Immature Grans (Abs): 0 10*3/uL (ref 0.0–0.1)
Immature Granulocytes: 0 %
Lymphocytes Absolute: 1.2 10*3/uL (ref 0.7–3.1)
Lymphs: 28 %
MCH: 29.8 pg (ref 26.6–33.0)
MCHC: 33.7 g/dL (ref 31.5–35.7)
MCV: 88 fL (ref 79–97)
Monocytes Absolute: 0.4 10*3/uL (ref 0.1–0.9)
Monocytes: 9 %
Neutrophils Absolute: 2.7 10*3/uL (ref 1.4–7.0)
Neutrophils: 59 %
Platelets: 150 10*3/uL (ref 150–450)
RBC: 4.87 x10E6/uL (ref 4.14–5.80)
RDW: 13.5 % (ref 11.6–15.4)
WBC: 4.5 10*3/uL (ref 3.4–10.8)

## 2022-06-19 LAB — HEMOGLOBIN A1C
Est. average glucose Bld gHb Est-mCnc: 117 mg/dL
Hgb A1c MFr Bld: 5.7 % — ABNORMAL HIGH (ref 4.8–5.6)

## 2022-06-19 LAB — LIPID PANEL
Chol/HDL Ratio: 4.7 ratio (ref 0.0–5.0)
Cholesterol, Total: 177 mg/dL (ref 100–199)
HDL: 38 mg/dL — ABNORMAL LOW (ref >39)
LDL Chol Calc (NIH): 106 mg/dL — ABNORMAL HIGH (ref 0–99)
Triglycerides: 191 mg/dL — ABNORMAL HIGH (ref 0–149)
VLDL Cholesterol Cal: 33 mg/dL (ref 5–40)

## 2022-06-19 LAB — CARDIOVASCULAR RISK ASSESSMENT

## 2022-06-19 MED ORDER — ROSUVASTATIN CALCIUM 10 MG PO TABS: 10.0000 mg | ORAL_TABLET | Freq: Every day | ORAL | 1 refills | Status: DC

## 2022-06-19 MED ORDER — POTASSIUM CHLORIDE CRYS ER 20 MEQ PO TBCR: 20.0000 meq | EXTENDED_RELEASE_TABLET | Freq: Every day | ORAL | 1 refills | Status: DC

## 2022-06-20 DIAGNOSIS — E6609 Other obesity due to excess calories: Secondary | ICD-10-CM | POA: Insufficient documentation

## 2022-06-20 DIAGNOSIS — R1319 Other dysphagia: Secondary | ICD-10-CM | POA: Insufficient documentation

## 2022-06-20 HISTORY — DX: Body mass index (BMI) 32.0-32.9, adult: E66.09

## 2022-06-20 NOTE — Assessment & Plan Note (Signed)
Recommend continue to work on eating healthy diet and exercise.  

## 2022-06-20 NOTE — Assessment & Plan Note (Signed)
Refer to gi.  

## 2022-06-20 NOTE — Assessment & Plan Note (Signed)
Well controlled.  Start on combination valsartan/HCTZ 320/25 mg daily, when individual prescriptions for valsartan and HCTZ run out. Come tomorrow morning for lab work. Continue to work on eating a healthy diet and exercise.  Labs drawn today.

## 2022-06-20 NOTE — Assessment & Plan Note (Addendum)
Not at goal. Start crestor 10 mg before bed.  Recommend continue to work on eating healthy diet and exercise. Check lab in the morning.

## 2022-06-24 ENCOUNTER — Ambulatory Visit: Payer: PPO | Admitting: Family Medicine

## 2022-06-25 ENCOUNTER — Other Ambulatory Visit: Payer: Self-pay | Admitting: Family Medicine

## 2022-06-26 DIAGNOSIS — M25512 Pain in left shoulder: Secondary | ICD-10-CM | POA: Diagnosis not present

## 2022-06-26 DIAGNOSIS — M19012 Primary osteoarthritis, left shoulder: Secondary | ICD-10-CM | POA: Diagnosis not present

## 2022-07-06 ENCOUNTER — Encounter: Payer: Self-pay | Admitting: Nurse Practitioner

## 2022-07-06 ENCOUNTER — Ambulatory Visit (INDEPENDENT_AMBULATORY_CARE_PROVIDER_SITE_OTHER): Payer: PPO | Admitting: Nurse Practitioner

## 2022-07-06 VITALS — BP 138/64 | HR 65 | Temp 97.4°F | Ht 71.0 in | Wt 235.0 lb

## 2022-07-06 DIAGNOSIS — K2270 Barrett's esophagus without dysplasia: Secondary | ICD-10-CM | POA: Diagnosis not present

## 2022-07-06 DIAGNOSIS — R011 Cardiac murmur, unspecified: Secondary | ICD-10-CM | POA: Diagnosis not present

## 2022-07-06 DIAGNOSIS — K219 Gastro-esophageal reflux disease without esophagitis: Secondary | ICD-10-CM | POA: Diagnosis not present

## 2022-07-06 DIAGNOSIS — Z01818 Encounter for other preprocedural examination: Secondary | ICD-10-CM

## 2022-07-06 DIAGNOSIS — Z86711 Personal history of pulmonary embolism: Secondary | ICD-10-CM | POA: Diagnosis not present

## 2022-07-06 DIAGNOSIS — B372 Candidiasis of skin and nail: Secondary | ICD-10-CM | POA: Diagnosis not present

## 2022-07-06 DIAGNOSIS — R1310 Dysphagia, unspecified: Secondary | ICD-10-CM | POA: Diagnosis not present

## 2022-07-06 DIAGNOSIS — K222 Esophageal obstruction: Secondary | ICD-10-CM | POA: Diagnosis not present

## 2022-07-06 DIAGNOSIS — K649 Unspecified hemorrhoids: Secondary | ICD-10-CM | POA: Diagnosis not present

## 2022-07-06 MED ORDER — NYSTATIN-TRIAMCINOLONE 100000-0.1 UNIT/GM-% EX CREA: 1.0000 | TOPICAL_CREAM | Freq: Two times a day (BID) | CUTANEOUS | 1 refills | Status: DC

## 2022-07-06 NOTE — Progress Notes (Signed)
Subjective:  Patient ID: Jesse [name]., male    DOB: 1941-05-25  Age: 81 y.o. MRN: [id number]  Chief Complaint  Patient presents with   Surgical Clea[place]     Left total shoulder surgery    HPI   [facility]  is here for a Pre-operative physical at the request of Dr. Rennis Chris.   He  is having left reverse total shoulder surgery on TBA for Osteoarthritis.  Personal or family hx of adverse outcome to anesthesia? No  Chipped, cracked, missing, or loose teeth? No -top dentures, bottom partial  Decreased ROM of neck? No  Able to walk up 2 flights of stairs without becoming significantly short of breath or having chest pain? Yes  Pt has a history of HTN, prediabetes, GERD, and hyperlipidemia that are currently well-controlled with medications. Pt denies any acute medical problems or recent URI. Pt states he does have a chronic rash to left groin for several weeks. Reports he has been applying topical cream to rash with minimal improvement.   Hypertension: He was last seen for hypertension 2 weeks ago.  BP at that visit was 124/64. Management includes Diovan-Hctz and verapamil.  Lipid/Cholesterol, Follow-up  Last lipid panel Other pertinent labs  Lab Results  Component Value Date   CHOL 177 06/18/2022   HDL 38 (L) 06/18/2022   LDLCALC 106 (H) 06/18/2022   TRIG 191 (H) 06/18/2022   CHOLHDL 4.7 06/18/2022   Lab Results  Component Value Date   ALT 35 06/18/2022   AST 26 06/18/2022   PLT 150 06/18/2022     He was last seen for this 2 weeks ago.  Management includes Crestor 10 mg QD.   Pertinent labs: Lab Results  Component Value Date   CHOL 177 06/18/2022   HDL 38 (L) 06/18/2022   LDLCALC 106 (H) 06/18/2022   TRIG 191 (H) 06/18/2022   CHOLHDL 4.7 06/18/2022   Lab Results  Component Value Date   NA 143 06/18/2022   K 3.2 (L) 06/18/2022   CREATININE 1.06 06/18/2022   EGFR 71 06/18/2022   GFRNONAA 73 01/02/2021   GLUCOSE 92 06/18/2022     GERD, Follow up:  The  patient was last seen for GERD 2 weeks ago. Current treatment consist PE:[id number] 20 mg QD Prediabetes, Follow-up  Lab Results  Component Value Date   HGBA1C 5.7 (H) 06/18/2022   HGBA1C 6.1 (H) 01/06/2022   HGBA1C 5.6 01/02/2021   GLUCOSE 92 06/18/2022   GLUCOSE 98 03/03/2022   GLUCOSE 93 01/06/2022    Last seen for for this2 weeks ago.  Management since that visit includes diet modification. Current symptoms include none and have been stable.   Pertinent Labs:    Component Value Date/Time   CHOL 177 06/18/2022 1622   TRIG 191 (H) 06/18/2022 1622   CHOLHDL 4.7 06/18/2022 1622   CREATININE 1.06 06/18/2022 1622   CREATININE 0.90 01/02/2019 1150    Wt Readings from Last 3 Encounters:  07/06/22 235 lb (106.6 kg)  06/17/22 236 lb (107 kg)  05/06/22 224 lb (101.6 kg)    Patient Active Problem List   Diagnosis Date Noted   Esophageal dysphagia 06/20/2022   Class 1 obesity due to excess calories with serious comorbidity and body mass index (BMI) of 32.0 to 32.9 in adult 06/20/2022   Mixed hyperlipidemia 06/17/2022   Osteoarthritis of left glenohumeral joint 11/05/2021   Allergic rhinitis due to allergen 10/14/2021   Essential hypertension 05/19/2020   Prediabetes 05/19/2020   Gastroesophageal  reflux disease without esophagitis 05/19/2020   Chronic left shoulder pain 05/19/2020   Impaired hearing 05/19/2020   Neuritis or radiculitis due to rupture of lumbar intervertebral disc 07/15/2018   S/P lumbar fusion 07/13/2018   Migraine without aura and without status migrainosus, not intractable 08/03/2017   Past Medical History:  Diagnosis Date   Acute pulmonary embolism without acute cor pulmonale (HCC) 09/02/2020   Arthritis    Degenerative lumbar spinal stenosis    Fatty liver    GERD (gastroesophageal reflux disease)    Headache    History of kidney stones    Hyperlipidemia    Hypertension    Mastodynia    Migraine without aura and without status migrainosus, not  intractable    Neuritis or radiculitis due to rupture of lumbar intervertebral disc 07/15/2018   Radiculopathy of thoracolumbar region 07/15/2018   Radiculopathy of thoracolumbar region 07/15/2018    Past Surgical History:  Procedure Laterality Date   ANTERIOR LATERAL LUMBAR FUSION WITH PERCUTANEOUS SCREW 2 LEVEL N/A 07/13/2018   Procedure: ANTERIOR LATERAL LUMBAR FUSION L2-4;  Surgeon: Venita Lick, MD;  Location: [facility];  Service: Orthopedics;  Laterality: N/A;  4 hrs   CHOLECYSTECTOMY  1999   GALLBLADDER SURGERY  2016   NECK SURGERY  1995   Ruptured disc repair   SHOULDER SURGERY Left 2005   SHOULDER SURGERY Right 2016   THROAT SURGERY     stretching    Current Outpatient Medications  Medication Sig Dispense Refill   acetaminophen (TYLENOL) 325 MG tablet Take 2 tablets (650 mg total) by mouth every 4 (four) hours as needed for mild pain, fever or headache.     Ascorbic Acid (VITAMIN C) 1000 MG tablet Take 1,000 mg by mouth daily.     cholecalciferol (VITAMIN D3) 25 MCG (1000 UNIT) tablet Take 2,000 Units by mouth daily.     Fexofenadine HCl (ALLERGY 24-HR PO) Take by mouth.     fluticasone (FLONASE) 50 MCG/ACT nasal spray Place 2 sprays into both nostrils daily.     omeprazole (PRILOSEC) 20 MG capsule Take 20 mg by mouth daily before breakfast.      potassium chloride SA (KLOR-CON M) 20 MEQ tablet Take 1 tablet (20 mEq total) by mouth daily. 90 tablet 1   rosuvastatin (CRESTOR) 10 MG tablet Take 1 tablet (10 mg total) by mouth daily. 90 tablet 1   valsartan-hydrochlorothiazide (DIOVAN-HCT) 320-25 MG tablet Take 1 tablet by mouth daily. 90 tablet 0   verapamil (CALAN-SR) 120 MG CR tablet Take 1 tablet (120 mg total) by mouth 2 (two) times daily. 180 tablet 1   No current facility-administered medications for this visit.    Allergies  Allergen Reactions   Penicillins Rash    Social History   Socioeconomic History   Marital status: Married    Spouse name: Darel Hong   Number of  children: 4   Years of education: 12+   Highest education level: Not on file  Occupational History   Occupation: Retired  Tobacco Use   Smoking status: Former    Years: 15.00    Types: Cigarettes    Quit date: 1980    Years since quitting: 43.6   Smokeless tobacco: Never   Tobacco comments:    quit at least 42 years ago   Vaping Use   Vaping Use: Never used  Substance and Sexual Activity   Alcohol use: No   Drug use: No   Sexual activity: Not Currently  Other Topics Concern  Not on file  Social History Narrative   Lives with wife   Caffeine use: Drinks decaf coffee and tea   Writes right-handed, eats and shaves left-handed   Social Determinants of Health   Financial Resource Strain: Low Risk  (05/06/2022)   Overall Financial Resource Strain (CARDIA)    Difficulty of Paying Living Expenses: Not hard at all  Food Insecurity: No Food Insecurity (05/06/2022)   Hunger Vital Sign    Worried About Running Out of Food in the Last Year: Never true    Ran Out of Food in the Last Year: Never true  Transportation Needs: No Transportation Needs (05/06/2022)   PRAPARE - Hydrologist (Medical): No    Lack of Transportation (Non-Medical): No  Physical Activity: Insufficiently Active (05/06/2022)   Exercise Vital Sign    Days of Exercise per Week: 3 days    Minutes of Exercise per Session: 30 min  Stress: No Stress Concern Present (05/06/2022)   [place]    Feeling of Stress : Not at all  Social Connections: Not on file  Intimate Partner Violence: Not At Risk (05/06/2022)   Humiliation, Afraid, Rape, and Kick questionnaire    Fear of Current or Ex-Partner: No    Emotionally Abused: No    Physically Abused: No    Sexually Abused: No    Family History  Problem Relation Age of Onset   Hypertension Mother    Cancer Father    Heart disease Sister    Diabetes Brother    Migraines Neg Hx         Current Outpatient Medications on File Prior to Visit  Medication Sig Dispense Refill   acetaminophen (TYLENOL) 325 MG tablet Take 2 tablets (650 mg total) by mouth every 4 (four) hours as needed for mild pain, fever or headache.     Ascorbic Acid (VITAMIN C) 1000 MG tablet Take 1,000 mg by mouth daily.     cholecalciferol (VITAMIN D3) 25 MCG (1000 UNIT) tablet Take 2,000 Units by mouth daily.     Fexofenadine HCl (ALLERGY 24-HR PO) Take by mouth.     fluticasone (FLONASE) 50 MCG/ACT nasal spray Place 2 sprays into both nostrils daily.     omeprazole (PRILOSEC) 20 MG capsule Take 20 mg by mouth daily before breakfast.      potassium chloride SA (KLOR-CON M) 20 MEQ tablet Take 1 tablet (20 mEq total) by mouth daily. 90 tablet 1   rosuvastatin (CRESTOR) 10 MG tablet Take 1 tablet (10 mg total) by mouth daily. 90 tablet 1   valsartan-hydrochlorothiazide (DIOVAN-HCT) 320-25 MG tablet Take 1 tablet by mouth daily. 90 tablet 0   verapamil (CALAN-SR) 120 MG CR tablet Take 1 tablet (120 mg total) by mouth 2 (two) times daily. 180 tablet 1   No current facility-administered medications on file prior to visit.   Past Medical History:  Diagnosis Date   Acute pulmonary embolism without acute cor pulmonale ([place]) 09/02/2020   Arthritis    Degenerative lumbar spinal stenosis    Fatty liver    GERD (gastroesophageal reflux disease)    Headache    History of kidney stones    Hyperlipidemia    Hypertension    Mastodynia    Migraine without aura and without status migrainosus, not intractable    Neuritis or radiculitis due to rupture of lumbar intervertebral disc 07/15/2018   Radiculopathy of thoracolumbar region 07/15/2018   Radiculopathy of  thoracolumbar region 07/15/2018   Past Surgical History:  Procedure Laterality Date   ANTERIOR LATERAL LUMBAR FUSION WITH PERCUTANEOUS SCREW 2 LEVEL N/A 07/13/2018   Procedure: ANTERIOR LATERAL LUMBAR FUSION L2-4;  Surgeon: Melina Schools, MD;  Location: [facility];  Service: Orthopedics;  Laterality: N/A;  4 hrs   CHOLECYSTECTOMY  1999   GALLBLADDER SURGERY  2016   NECK SURGERY  1995   Ruptured disc repair   SHOULDER SURGERY Left 2005   SHOULDER SURGERY Right 2016   THROAT SURGERY     stretching    Family History  Problem Relation Age of Onset   Hypertension Mother    Cancer Father    Heart disease Sister    Diabetes Brother    Migraines Neg Hx    Social History   Socioeconomic History   Marital status: Married    Spouse name: [name]   Number of children: 4   Years of education: 12+   Highest education level: Not on file  Occupational History   Occupation: Retired  Tobacco Use   Smoking status: Former    Years: 15.00    Types: Cigarettes    Quit date: 1980    Years since quitting: 43.6   Smokeless tobacco: Never   Tobacco comments:    quit at least 70 years ago   Vaping Use   Vaping Use: Never used  Substance and Sexual Activity   Alcohol use: No   Drug use: No   Sexual activity: Not Currently  Other Topics Concern   Not on file  Social History Narrative   Lives with wife   Caffeine use: Drinks decaf coffee and tea   Writes right-handed, eats and shaves left-handed   Social Determinants of Health   Financial Resource Strain: Low Risk  (05/06/2022)   Overall Financial Resource Strain (CARDIA)    Difficulty of Paying Living Expenses: Not hard at all  Food Insecurity: No Food Insecurity (05/06/2022)   Hunger Vital Sign    Worried About Running Out of Food in the Last Year: Never true    [place] in the Last Year: Never true  Transportation Needs: No Transportation Needs (05/06/2022)   PRAPARE - Hydrologist (Medical): No    Lack of Transportation (Non-Medical): No  Physical Activity: Insufficiently Active (05/06/2022)   Exercise Vital Sign    Days of Exercise per Week: 3 days    Minutes of Exercise per Session: 30 min  Stress: No Stress Concern Present (05/06/2022)   [place]    Feeling of Stress : Not at all  Social Connections: Not on file    Review of Systems  Constitutional:  Negative for appetite change, fatigue and fever.  HENT:  Negative for congestion, ear pain, sinus pressure and sore throat.   Respiratory:  Negative for cough, chest tightness, shortness of breath and wheezing.   Cardiovascular:  Negative for chest pain and palpitations.  Gastrointestinal:  Negative for abdominal pain, constipation, diarrhea, nausea and vomiting.  Genitourinary:  Negative for dysuria and hematuria.  Musculoskeletal:  Negative for arthralgias, back pain, joint swelling and myalgias.  Skin:  Positive for rash (left groin).  Neurological:  Negative for dizziness, weakness and headaches.  Psychiatric/Behavioral:  Negative for dysphoric mood. The patient is not nervous/anxious.      Objective:  BP 138/64 (BP Location: Left Arm, Patient Position: Sitting)   Pulse 65   Temp Marland Kitchen)  97.4 F (36.3 C) (Oral)   Ht $R'5\' 11"'oY$  (1.803 m)   Wt 235 lb (106.6 kg)   SpO2 97%   BMI 32.78 kg/m      07/06/2022    9:06 AM 06/17/2022   10:21 AM 05/06/2022    9:11 AM  BP/Weight  Systolic BP 062 376   Diastolic BP 64 64   Wt. (Lbs) 235 236 224  BMI 32.78 kg/m2 32.92 kg/m2 31.24 kg/m2    Physical Exam Vitals reviewed.  Constitutional:      Appearance: Normal appearance.  HENT:     Head: Normocephalic.     Right Ear: Tympanic membrane normal.     Left Ear: Tympanic membrane normal.     Ears:     Comments: Bilateral hearing aids    Nose: Nose normal.     Mouth/Throat:     Pharynx: No posterior oropharyngeal erythema.  Eyes:     Pupils: Pupils are equal, round, and reactive to light.  Neck:     Vascular: No carotid bruit.  Cardiovascular:     Rate and Rhythm: Normal rate and regular rhythm.     Heart sounds: Murmur heard.  Pulmonary:     Effort: Pulmonary effort is normal.     Breath sounds: Normal  breath sounds.  Abdominal:     General: Bowel sounds are normal.     Palpations: Abdomen is soft.  Musculoskeletal:     Cervical back: Normal range of motion.  Skin:    General: Skin is warm and dry.     Capillary Refill: Capillary refill takes less than 2 seconds.     Findings: Rash (left groin) present.  Neurological:     General: No focal deficit present.     Mental Status: He is alert and oriented to person, place, and time.  Psychiatric:        Mood and Affect: Mood normal.        Behavior: Behavior normal.     Lab Results  Component Value Date   WBC 4.5 06/18/2022   HGB 14.5 06/18/2022   HCT 43.0 06/18/2022   PLT 150 06/18/2022   GLUCOSE 92 06/18/2022   CHOL 177 06/18/2022   TRIG 191 (H) 06/18/2022   HDL 38 (L) 06/18/2022   LDLCALC 106 (H) 06/18/2022   ALT 35 06/18/2022   AST 26 06/18/2022   NA 143 06/18/2022   K 3.2 (L) 06/18/2022   CL 102 06/18/2022   CREATININE 1.06 06/18/2022   BUN 14 06/18/2022   CO2 27 06/18/2022   INR 1.6 (H) 09/02/2020   HGBA1C 5.7 (H) 06/18/2022      Assessment & Plan:   1. Preop general physical exam - nystatin-triamcinolone (MYCOLOG II) cream; Apply 1 Application topically 2 (two) times daily.  Dispense: 30 g; Refill: 1 - Ambulatory referral to Cardiology  2. [place] intertrigo - nystatin-triamcinolone (MYCOLOG II) cream; Apply 1 Application topically 2 (two) times daily.  Dispense: 30 g; Refill: 1  3. Heart murmur - Ambulatory referral to Cardiology  4. Personal history of PE (pulmonary embolism) - Ambulatory referral to Cardiology   Apply Mycolog cream to left groin rash twice daily We will call you with referral appt to cardiology  Follow-up as needed    Follow-up: PRN  An After Visit Summary was printed and given to the patient.  I, Rip Harbour, NP, have reviewed all documentation for this visit. The documentation on 07/06/22 for the exam, diagnosis, procedures, and orders are all accurate  and complete.    I,Lauren M Auman,acting as a Education administrator for [employer], NP.,have documented all relevant documentation on the behalf of Rip Harbour, NP,as directed by  Rip Harbour, NP while in the presence of Rip Harbour, NP.   Signed, Rip Harbour, NP [place] [phone]

## 2022-07-06 NOTE — Patient Instructions (Addendum)
Apply Mycolog cream to left groin rash twice daily We will call you with referral appt to cardiology  Follow-up as needed Heart Murmur A heart murmur is an extra sound that is caused by turbulent blood flow through the valves of the heart. The murmur can be heard as a "hum" or "whoosh" sound when blood flows through the heart. The heart has four areas called chambers. There is a valve for each chamber for the heart. Blood passes through a valve before leaving the chamber. Two of the valves move the blood from the upper chambers of the heart to the lower chambers of the heart (tricuspid valve and mitral valve). The other two valves (aortic valve and pulmonary valve) move the blood to the lungs and the rest of the body. The valves keep blood moving through the heart in the right direction. When the heart valves are not working properly it may cause a murmur. There are two types of heart murmurs: Innocent (benign) murmurs. Most people with this type of heart murmur do not have a heart problem. Many children have innocent heart murmurs. Your health care provider may suggest some basic tests to find out whether your murmur is an innocent murmur. If an innocent heart murmur is found, there is no need for further tests or treatment and no need to restrict activities or stop playing sports. Abnormal murmurs. These types of murmurs can occur in children and adults. Abnormal murmurs may be a sign of a more serious heart condition, such as a heart abnormality present at birth (congenital defect) or heart valve disease. What are the causes? This condition may also be caused by: Pregnancy. Fever. Overactive thyroid gland. Anemia. Exercise. Rapid growth spurts in children. What are the signs or symptoms? Innocent murmurs do not cause symptoms, and many people with abnormal murmurs may not have symptoms. If symptoms do develop, they may include: Shortness of breath or persistent cough. Blue coloring of the  skin, especially on the fingertips. Chest pain. Palpitations, or feeling a fluttering or skipped heartbeat. Fainting. Getting tired much faster than expected. Swelling in the abdomen, feet, or ankles. How is this diagnosed? This condition may be diagnosed during a routine physical or other exam. If your health care provider hears a murmur with a stethoscope, he or she will listen for: Where the murmur is located in your heart. How loud the murmur is. This may help the health care provider figure out what is causing the murmur. You may be referred to a heart specialist (cardiologist). You may also have other tests, including: Electrocardiogram (ECG or EKG). This test measures the electrical activity of your heart. Echocardiogram. This test uses high frequency sound waves to make pictures of your heart. Chest X-ray. Magnetic resonance imaging (MRI). Cardiac catheterization. This test looks at blood flow through the arteries around the heart. For children and adults who have an abnormal heart murmur and want to stay active, it is important to: Complete testing. Review test results. Receive recommendations from your health care provider. If heart disease is present, it may not be safe to play or be involved in activities that require a lot of effort and energy (are strenuous). How is this treated? Heart murmurs themselves do not need treatment. In some cases, a heart murmur may go away on its own. If an underlying problem or disease is causing the murmur, you may need treatment. If treatment is needed, it will depend on the type and severity of the disease or heart problem  causing the murmur. Treatment may include: Medicine. Surgery. Changes to your lifestyle and diet. Follow these instructions at home: Talk with your health care provider before participating in sports or other activities that are strenuous. Learn as much as possible about your condition and any related diseases. Ask your  health care provider if you may be at risk for any medical emergencies. Talk with your health care provider about what symptoms you should look out for. It is up to you to get your test results. Ask your health care provider, or the department that is doing the test, when your results will be ready. Keep all follow-up visits. This is important. Contact a health care provider if: You are frequently short of breath. You feel more tired than usual. You are having a hard time keeping up with normal activities or fitness routines. You have swelling in your ankles or feet. You notice that your heart often beats irregularly. You develop any new symptoms. Get help right away if: You have chest pain. You are having trouble breathing. You feel light-headed or you faint. Your symptoms suddenly get worse. These symptoms may represent a serious problem that is an emergency. Do not wait to see if the symptoms will go away. Get medical help right away. Call your local emergency services (911 in the [place] Do not drive yourself to the hospital. Summary Heart valves keep blood moving through the heart in the right direction. When the valves are not working properly it may cause a murmur. Innocent murmurs do not cause symptoms, and many people with abnormal murmurs may not have symptoms. You may need treatment if an underlying problem or disease is causing the heart murmur. Treatment may include medicine, surgery, and changes to your lifestyle and diet. Talk with your health care provider before participating in sports or other activities that are strenuous. Get help right away if you have chest pain or trouble breathing. This information is not intended to replace advice given to you by your health care provider. Make sure you discuss any questions you have with your health care provider. Document Revised: 03/03/2021 Document Reviewed: 03/03/2021 Elsevier Patient Education  2023 Elsevier  Inc.  Intertrigo Intertrigo is skin irritation (inflammation) that happens in warm, moist areas of the body. The irritation can cause a rash and make skin raw and itchy. The rash is usually pink or red. It happens mostly between folds of skin or where skin rubs together, such as: Between the toes. In the armpits. In the groin area. Under the belly. Under the breasts. Around the butt area. This condition is not passed from person to person (is not contagious). What are the causes? Heat, moisture, rubbing, and not enough air movement. The condition can be made worse by: Sweat. Bacteria. A fungus, such as yeast. What increases the risk? Moisture in your skin folds. You are more likely to develop this condition if you: Have diabetes. Are overweight. Are not able to move around. Live in a warm and moist climate. Wear splints, braces, or other medical devices. Are not able to control your pee (urine) or poop (stool). What are the signs or symptoms? A pink or red skin rash in the skin fold or near the skin fold. Raw or scaly skin. Itching. A burning feeling. Bleeding. Leaking fluid. A bad smell. How is this treated? Cleaning and drying your skin. Taking an antibiotic medicine or using an antibiotic skin cream for a bacterial infection. Using an antifungal cream on your skin or  taking pills for an infection that was caused by a fungus, such as yeast. Using a steroid ointment to stop the itching and irritation. Separating the skin fold with a clean cotton cloth to absorb moisture and allow air to flow into the area. Follow these instructions at home: Keep the affected area clean and dry. Do not scratch your skin. Stay cool as much as you can. Use an air conditioner or a fan, if you have one. Apply over-the-counter and prescription medicines only as told by your doctor. If you were prescribed an antibiotic medicine, use it as told by your doctor. Do not stop using the antibiotic  even if your condition starts to get better. Keep all follow-up visits as told by your doctor. This is important. How is this prevented?  Stay at a healthy weight. Take care of your feet. This is very important if you have diabetes. You should: Wear shoes that fit well. Keep your feet dry. Wear clean cotton or wool socks. Protect the skin in your groin and butt area as told by your doctor. To do this: Follow a regular cleaning routine. Use creams, powders, or ointments that protect your skin. Change protection pads often. Do not wear tight clothes. Wear clothes that: Are loose. Take moisture away from your body. Are made of cotton. Wear a bra that gives good support, if needed. Shower and dry yourself well after being active. Use a hair dryer on a cool setting to dry between skin folds. Keep your blood sugar under control if you have diabetes. Contact a doctor if: Your symptoms do not get better with treatment. Your symptoms get worse or they spread. You notice more redness and warmth. You have a fever. Summary Intertrigo is skin irritation that occurs when folds of skin rub together. This condition is caused by heat, moisture, and rubbing. This condition may be treated by cleaning and drying your skin and with medicines. Apply over-the-counter and prescription medicines only as told by your doctor. Keep all follow-up visits as told by your doctor. This is important. This information is not intended to replace advice given to you by your health care provider. Make sure you discuss any questions you have with your health care provider. Document Revised: 09/08/2021 Document Reviewed: 09/08/2021 Elsevier Patient Education  2023 [employer].

## 2022-07-09 ENCOUNTER — Encounter: Payer: Self-pay | Admitting: Cardiology

## 2022-07-09 ENCOUNTER — Ambulatory Visit: Payer: PPO | Admitting: Cardiology

## 2022-07-09 ENCOUNTER — Ambulatory Visit: Payer: PPO | Admitting: Family Medicine

## 2022-07-09 VITALS — BP 138/62 | HR 63 | Ht 71.0 in | Wt 236.0 lb

## 2022-07-09 DIAGNOSIS — I350 Nonrheumatic aortic (valve) stenosis: Secondary | ICD-10-CM | POA: Diagnosis not present

## 2022-07-09 DIAGNOSIS — Z86711 Personal history of pulmonary embolism: Secondary | ICD-10-CM | POA: Diagnosis not present

## 2022-07-09 DIAGNOSIS — Z01810 Encounter for preprocedural cardiovascular examination: Secondary | ICD-10-CM

## 2022-07-09 DIAGNOSIS — I10 Essential (primary) hypertension: Secondary | ICD-10-CM | POA: Diagnosis not present

## 2022-07-09 MED ORDER — CLINDAMYCIN HCL 300 MG PO CAPS: ORAL_CAPSULE | ORAL | 0 refills | Status: DC

## 2022-07-09 NOTE — Progress Notes (Signed)
Cardiology Office Note:    Date:  07/09/2022   ID:  Jesse [name]., DOB 1941/06/14, MRN [id number]  PCP:  Blane Ohara, MD  Cardiologist:  Norman Herrlich, MD   Referring MD: Janie Morning, NP  ASSESSMENT:    1. Nonrheumatic aortic valve stenosis   2. Essential hypertension   3. Preoperative cardiovascular examination   4. History of pulmonary embolism    PLAN:    In order of problems listed above:  He has known mild aortic stenosis echocardiogram 2 and half years ago with very mild gradient he is asymptomatic I think he should have a repeat echocardiogram to stage his progression I do not think that this will interfere with his planned surgeries he is asymptomatic and his examination is not consistent with severe stenosis we will have it done in the next week.  He does need to start endocarditis prophylaxis. Stable hypertension continue his current treatment including ARB thiazide diuretic and calcium channel blocker Continue his statin he has coronary atherosclerosis not having angina and does not need an ischemia evaluation with normal exercise tolerance He had provoked pulmonary embolism in the setting of COVID-19 infection  Next appointment follow-up with me in 1 year   Medication Adjustments/Labs and Tests Ordered: Current medicines are reviewed at length with the patient today.  Concerns regarding medicines are outlined above.  Orders Placed This Encounter  Procedures   EKG 12-Lead   ECHOCARDIOGRAM COMPLETE   Meds ordered this encounter  Medications   clindamycin (CLEOCIN) 300 MG capsule    Sig: Take 600 mg (2 capsules) 1 hour pre-dental procedure.    Dispense:  2 capsule    Refill:  0      Chief complaint I was noted to have a murmur on my preoperative evaluation  History of Present Illness:    Jesse [name]. is a 81 y.o. male who is being seen today for urgent evaluation of preoperative risk with heart murmur at the request of Janie Morning, NP.   He has a history of Barrett's esophagus hypertension hyperlipidemia and prediabetes.  He was seen in preoperative evaluation 07/06/2022 by his PCP and is now referred because of heart murmur  He had CT of the chest 09/02/2020 confirming pulmonary embolism left lung left pleural effusion abnormality on the right described as multifocal pneumonia he had no right heart strain but he did have coronary artery calcification.  An echocardiogram performed 09/03/2020 left ventricle normal in size mild LVH EF 60 to 65% and grade 1 diastolic dysfunction.  Aortic valve is calcified with mild stenosis mean gradient 14 mmHg.  He alerts me he is going to be having cataract surgery as well as left total shoulder arthroplasty Dr. Lysle Pearl. He is a retired Visual merchandiser vigorous active no exercise intolerance edema shortness of breath chest pain palpitation or syncope His pulmonary embolism occurred in the setting of COVID 19 infection no longer anticoagulated He does not take endocarditis prophylaxis will start today He has had no fever or chills. He has no known history of congenital or rheumatic heart disease Past Medical History:  Diagnosis Date   Acute pulmonary embolism without acute cor pulmonale (HCC) 09/02/2020   Arthritis    Degenerative lumbar spinal stenosis    Fatty liver    GERD (gastroesophageal reflux disease)    Headache    History of kidney stones    Hyperlipidemia    Hypertension    Mastodynia    Migraine without aura and  without status migrainosus, not intractable    Neuritis or radiculitis due to rupture of lumbar intervertebral disc 07/15/2018   Radiculopathy of thoracolumbar region 07/15/2018   Radiculopathy of thoracolumbar region 07/15/2018    Past Surgical History:  Procedure Laterality Date   ANTERIOR LATERAL LUMBAR FUSION WITH PERCUTANEOUS SCREW 2 LEVEL N/A 07/13/2018   Procedure: ANTERIOR LATERAL LUMBAR FUSION L2-4;  Surgeon: Venita Lick, MD;  Location: [facility] OR;  Service:  Orthopedics;  Laterality: N/A;  4 hrs   CHOLECYSTECTOMY  1999   GALLBLADDER SURGERY  2016   NECK SURGERY  1995   Ruptured disc repair   SHOULDER SURGERY Left 2005   SHOULDER SURGERY Right 2016   THROAT SURGERY     stretching    Current Medications: Current Meds  Medication Sig   acetaminophen (TYLENOL) 325 MG tablet Take 2 tablets (650 mg total) by mouth every 4 (four) hours as needed for mild pain, fever or headache.   Ascorbic Acid (VITAMIN C) 1000 MG tablet Take 1,000 mg by mouth daily.   cholecalciferol (VITAMIN D3) 25 MCG (1000 UNIT) tablet Take 2,000 Units by mouth daily.   clindamycin (CLEOCIN) 300 MG capsule Take 600 mg (2 capsules) 1 hour pre-dental procedure.   Fexofenadine HCl (ALLERGY 24-HR PO) Take by mouth.   fluticasone (FLONASE) 50 MCG/ACT nasal spray Place 2 sprays into both nostrils daily.   nystatin-triamcinolone (MYCOLOG II) cream Apply 1 Application topically 2 (two) times daily.   omeprazole (PRILOSEC) 20 MG capsule Take 20 mg by mouth daily before breakfast.    potassium chloride SA (KLOR-CON M) 20 MEQ tablet Take 1 tablet (20 mEq total) by mouth daily.   rosuvastatin (CRESTOR) 10 MG tablet Take 1 tablet (10 mg total) by mouth daily.   valsartan-hydrochlorothiazide (DIOVAN-HCT) 320-25 MG tablet Take 1 tablet by mouth daily.   verapamil (CALAN-SR) 120 MG CR tablet Take 1 tablet (120 mg total) by mouth 2 (two) times daily.     Allergies:   Penicillins   Social History   Socioeconomic History   Marital status: Married    Spouse name: Darel Hong   Number of children: 4   Years of education: 12+   Highest education level: Not on file  Occupational History   Occupation: Retired  Tobacco Use   Smoking status: Former    Years: 15.00    Types: Cigarettes    Quit date: 1980    Years since quitting: 43.6   Smokeless tobacco: Never   Tobacco comments:    quit at least 42 years ago   Vaping Use   Vaping Use: Never used  Substance and Sexual Activity   Alcohol  use: No   Drug use: No   Sexual activity: Not Currently  Other Topics Concern   Not on file  Social History Narrative   Lives with wife   Caffeine use: Drinks decaf coffee and tea   Writes right-handed, eats and shaves left-handed   Social Determinants of Health   Financial Resource Strain: Low Risk  (05/06/2022)   Overall Financial Resource Strain (CARDIA)    Difficulty of Paying Living Expenses: Not hard at all  Food Insecurity: No Food Insecurity (05/06/2022)   Hunger Vital Sign    Worried About Running Out of Food in the Last Year: Never true    Ran Out of Food in the Last Year: Never true  Transportation Needs: No Transportation Needs (05/06/2022)   PRAPARE - Administrator, Civil Service (Medical): No  Lack of Transportation (Non-Medical): No  Physical Activity: Insufficiently Active (05/06/2022)   Exercise Vital Sign    Days of Exercise per Week: 3 days    Minutes of Exercise per Session: 30 min  Stress: No Stress Concern Present (05/06/2022)   [employer] of Occupational Health - Occupational Stress Questionnaire    Feeling of Stress : Not at all  Social Connections: Not on file     Family History: The patient's family history includes Cancer in his father; Diabetes in his brother; Heart disease in his sister; Hypertension in his mother. There is no history of Migraines.  ROS:   ROS Please see the history of present illness.     All other systems reviewed and are negative.  EKGs/Labs/Other Studies Reviewed:    The following studies were reviewed today:   EKG:  EKG is  ordered today.  The ekg ordered today is personally reviewed and demonstrates sinus rhythm 1 PVC late transition of the precordial leads left axis deviation  Recent Labs: 06/18/2022: ALT 35; BUN 14; Creatinine, Ser 1.06; Hemoglobin 14.5; Platelets 150; Potassium 3.2; Sodium 143  Recent Lipid Panel    Component Value Date/Time   CHOL 177 06/18/2022 1622   TRIG 191 (H) 06/18/2022  1622   HDL 38 (L) 06/18/2022 1622   CHOLHDL 4.7 06/18/2022 1622   LDLCALC 106 (H) 06/18/2022 1622    Physical Exam:    VS:  BP 138/62 (BP Location: Left Arm, Patient Position: Sitting, Cuff Size: Normal)   Pulse 63   Ht 5\' 11"  (1.803 m)   Wt 236 lb (107 kg)   SpO2 97%   BMI 32.92 kg/m     Wt Readings from Last 3 Encounters:  07/09/22 236 lb (107 kg)  07/06/22 235 lb (106.6 kg)  06/17/22 236 lb (107 kg)     GEN: He looks younger than his age well nourished, well developed in no acute distress HEENT: Normal NECK: No JVD; No carotid bruits LYMPHATICS: No lymphadenopathy CARDIAC: Grade 1/6 to 2/6 murmur aortic stenosis localized aortic area does not radiate to the clavicle or carotids S2 is normal no aortic regurgitation RRR, no murmurs, rubs, gallops RESPIRATORY:  Clear to auscultation without rales, wheezing or rhonchi  ABDOMEN: Soft, non-tender, non-distended MUSCULOSKELETAL:  No edema; No deformity  SKIN: Warm and dry NEUROLOGIC:  Alert and oriented x 3 PSYCHIATRIC:  Normal affect     Signed, 08/18/22, MD  07/09/2022 4:01 PM    [facility]

## 2022-07-09 NOTE — Patient Instructions (Signed)
Medication Instructions:  Your physician recommends that you continue on your current medications as directed. Please refer to the Current Medication list given to you today.  *If you need a refill on your cardiac medications before your next appointment, please call your pharmacy*   Lab Work: None If you have labs (blood work) drawn today and your tests are completely normal, you will receive your results only by: MyChart Message (if you have MyChart) OR A paper copy in the mail If you have any lab test that is abnormal or we need to change your treatment, we will call you to review the results.   Testing/Procedures: Your physician has requested that you have an echocardiogram. Echocardiography is a painless test that uses sound waves to create images of your heart. It provides your doctor with information about the size and shape of your heart and how well your heart's chambers and valves are working. This procedure takes approximately one hour. There are no restrictions for this procedure.    Follow-Up: At [facility], you and your health needs are our priority.  As part of our continuing mission to provide you with exceptional heart care, we have created designated Provider Care Teams.  These Care Teams include your primary Cardiologist (physician) and Advanced Practice Providers (APPs -  Physician Assistants and Nurse Practitioners) who all work together to provide you with the care you need, when you need it.  We recommend signing up for the patient portal called "MyChart".  Sign up information is provided on this After Visit Summary.  MyChart is used to connect with patients for Virtual Visits (Telemedicine).  Patients are able to view lab/test results, encounter notes, upcoming appointments, etc.  Non-urgent messages can be sent to your provider as well.   To learn more about what you can do with MyChart, go to [email or web address].    Your next appointment:   1 year(s)  The  format for your next appointment:   In Person  Provider:   Norman Herrlich, MD    Other Instructions Take clindamycin 600 mg 1 hour pre-dental procedure.  Important Information About Sugar

## 2022-07-14 ENCOUNTER — Ambulatory Visit (INDEPENDENT_AMBULATORY_CARE_PROVIDER_SITE_OTHER): Payer: PPO

## 2022-07-14 DIAGNOSIS — I350 Nonrheumatic aortic (valve) stenosis: Secondary | ICD-10-CM | POA: Diagnosis not present

## 2022-07-14 DIAGNOSIS — Z86711 Personal history of pulmonary embolism: Secondary | ICD-10-CM

## 2022-07-14 DIAGNOSIS — I10 Essential (primary) hypertension: Secondary | ICD-10-CM | POA: Diagnosis not present

## 2022-07-14 DIAGNOSIS — Z01810 Encounter for preprocedural cardiovascular examination: Secondary | ICD-10-CM

## 2022-07-15 ENCOUNTER — Other Ambulatory Visit: Payer: Self-pay | Admitting: *Deleted

## 2022-07-15 DIAGNOSIS — I350 Nonrheumatic aortic (valve) stenosis: Secondary | ICD-10-CM

## 2022-07-15 LAB — ECHOCARDIOGRAM COMPLETE
AR max vel: 1.1 cm2
AV Area VTI: 1.09 cm2
AV Area mean vel: 1.07 cm2
AV Mean grad: 15 mmHg
AV Peak grad: 25 mmHg
Ao pk vel: 2.5 m/s
Area-P 1/2: 2.56 cm2
S' Lateral: 2.6 cm

## 2022-07-15 NOTE — Progress Notes (Signed)
Echo order- 1 year recall.

## 2022-07-21 NOTE — Progress Notes (Signed)
Sent message, via epic in basket, requesting orders in epic from surgeon.  

## 2022-07-27 DIAGNOSIS — Z01818 Encounter for other preprocedural examination: Secondary | ICD-10-CM | POA: Diagnosis not present

## 2022-07-27 DIAGNOSIS — H2512 Age-related nuclear cataract, left eye: Secondary | ICD-10-CM | POA: Diagnosis not present

## 2022-07-27 DIAGNOSIS — H2511 Age-related nuclear cataract, right eye: Secondary | ICD-10-CM | POA: Diagnosis not present

## 2022-07-27 NOTE — Patient Instructions (Signed)
DUE TO COVID-19 ONLY TWO VISITORS  (aged 81 and older)  ARE ALLOWED TO COME WITH YOU AND STAY IN THE WAITING ROOM ONLY DURING PRE OP AND PROCEDURE.   **NO VISITORS ARE ALLOWED IN THE SHORT STAY AREA OR RECOVERY ROOM!!**  IF YOU WILL BE ADMITTED INTO THE HOSPITAL YOU ARE ALLOWED ONLY FOUR SUPPORT PEOPLE DURING VISITATION HOURS ONLY (7 AM -8PM)   The support person(s) must pass our screening, gel in and out, and wear a mask at all times, including in the patient's room. Patients must also wear a mask when staff or their support person are in the room. Visitors GUEST BADGE MUST BE WORN VISIBLY  One adult visitor may remain with you overnight and MUST be in the room by 8 P.M.     Your procedure is scheduled on: 08/06/22   Report to [facility] Main Entrance    Report to admitting at : 5:30 AM   Call this number if you have problems the morning of surgery [phone]   Do not eat food :After Midnight.   After Midnight you may have the following liquids until: 4:30 AM DAY OF SURGERY  Water Black Coffee (sugar ok, NO MILK/CREAM OR CREAMERS)  Tea (sugar ok, NO MILK/CREAM OR CREAMERS) regular and decaf                             Plain Jell-O (NO RED)                                           Fruit ices (not with fruit pulp, NO RED)                                     Popsicles (NO RED)                                                                  Juice: apple, WHITE grape, WHITE cranberry Sports drinks like Gatorade (NO RED)              Drink Ensure drink AT : 4:30 AM the day of surgery.     The day of surgery:  Drink ONE (1) Pre-Surgery Clear Ensure or G2 at AM the morning of surgery. Drink in one sitting. Do not sip.  This drink was given to you during your hospital  pre-op appointment visit. Nothing else to drink after completing the  Pre-Surgery Clear Ensure or G2.          If you have questions, please contact your surgeon's office.  Oral Hygiene is also important  to reduce your risk of infection.                                    Remember - BRUSH YOUR TEETH THE MORNING OF SURGERY WITH YOUR REGULAR TOOTHPASTE   Do NOT smoke after Midnight   Take these medicines the morning of surgery with A SIP OF WATER:  fexofenadine,verapamil,omeprazole.Tylenol as needed.  DO NOT TAKE ANY ORAL DIABETIC MEDICATIONS DAY OF YOUR SURGERY  Bring CPAP mask and tubing day of surgery.                              You may not have any metal on your body including hair pins, jewelry, and body piercing             Do not wear  lotions, powders, perfumes/cologne, or deodorant              Men may shave face and neck.   Do not bring valuables to the hospital. [facility] IS NOT             RESPONSIBLE   FOR VALUABLES.   Contacts, dentures or bridgework may not be worn into surgery.   Bring small overnight bag day of surgery.   DO NOT BRING YOUR HOME MEDICATIONS TO THE HOSPITAL. PHARMACY WILL DISPENSE MEDICATIONS LISTED ON YOUR MEDICATION LIST TO YOU DURING YOUR ADMISSION IN THE HOSPITAL!    Patients discharged on the day of surgery will not be allowed to drive home.  Someone NEEDS to stay with you for the first 24 hours after anesthesia.   Special Instructions: Bring a copy of your healthcare power of attorney and living will documents         the day of surgery if you haven't scanned them before.              Please read over the following fact sheets you were given: IF YOU HAVE QUESTIONS ABOUT YOUR PRE-OP INSTRUCTIONS PLEASE CALL [phone]     [facility] - Preparing for Surgery Before surgery, you can play an important role.  Because skin is not sterile, your skin needs to be as free of germs as possible.  You can reduce the number of germs on your skin by washing with CHG (chlorahexidine gluconate) soap before surgery.  CHG is an antiseptic cleaner which kills germs and bonds with the skin to continue killing germs even after washing. Please DO NOT use if you  have an allergy to CHG or antibacterial soaps.  If your skin becomes reddened/irritated stop using the CHG and inform your nurse when you arrive at Short Stay. Do not shave (including legs and underarms) for at least 48 hours prior to the first CHG shower.  You may shave your face/neck. Please follow these instructions carefully:  1.  Shower with CHG Soap the night before surgery and the  morning of Surgery.  2.  If you choose to wash your hair, wash your hair first as usual with your  normal  shampoo.  3.  After you shampoo, rinse your hair and body thoroughly to remove the  shampoo.                           4.  Use CHG as you would any other liquid soap.  You can apply chg directly  to the skin and wash                       Gently with a scrungie or clean washcloth.  5.  Apply the CHG Soap to your body ONLY FROM THE NECK DOWN.   Do not use on face/ open  Wound or open sores. Avoid contact with eyes, ears mouth and genitals (private parts).                       Wash face,  Genitals (private parts) with your normal soap.             6.  Wash thoroughly, paying special attention to the area where your surgery  will be performed.  7.  Thoroughly rinse your body with warm water from the neck down.  8.  DO NOT shower/wash with your normal soap after using and rinsing off  the CHG Soap.                9.  Pat yourself dry with a clean towel.            10.  Wear clean pajamas.            11.  Place clean sheets on your bed the night of your first shower and do not  sleep with pets. Day of Surgery : Do not apply any lotions/deodorants the morning of surgery.  Please wear clean clothes to the hospital/surgery center.  FAILURE TO FOLLOW THESE INSTRUCTIONS MAY RESULT IN THE CANCELLATION OF YOUR SURGERY PATIENT SIGNATURE_________________________________  NURSE  SIGNATURE__________________________________  ________________________________________________________________________ [facility]- Preparing for Total Shoulder Arthroplasty    Before surgery, you can play an important role. Because skin is not sterile, your skin needs to be as free of germs as possible. You can reduce the number of germs on your skin by using the following products. Benzoyl Peroxide Gel Reduces the number of germs present on the skin Applied twice a day to shoulder area starting two days before surgery    ==================================================================  Please follow these instructions carefully:  BENZOYL PEROXIDE 5% GEL  Please do not use if you have an allergy to benzoyl peroxide.   If your skin becomes reddened/irritated stop using the benzoyl peroxide.  Starting two days before surgery, apply as follows: Apply benzoyl peroxide in the morning and at night. Apply after taking a shower. If you are not taking a shower clean entire shoulder front, back, and side along with the armpit with a clean wet washcloth.  Place a quarter-sized dollop on your shoulder and rub in thoroughly, making sure to cover the front, back, and side of your shoulder, along with the armpit.   2 days before ____ AM   ____ PM              1 day before ____ AM   ____ PM                         Do this twice a day for two days.  (Last application is the night before surgery, AFTER using the CHG soap as described below).  Do NOT apply benzoyl peroxide gel on the day of surgery.   Incentive Spirometer  An incentive spirometer is a tool that can help keep your lungs clear and active. This tool measures how well you are filling your lungs with each breath. Taking long deep breaths may help reverse or decrease the chance of developing breathing (pulmonary) problems (especially infection) following: A long period of time when you are unable to move or be active. BEFORE THE PROCEDURE   If the spirometer includes an indicator to show your best effort, your nurse or respiratory therapist will set it to a desired goal. If possible,  sit up straight or lean slightly forward. Try not to slouch. Hold the incentive spirometer in an upright position. INSTRUCTIONS FOR USE  Sit on the edge of your bed if possible, or sit up as far as you can in bed or on a chair. Hold the incentive spirometer in an upright position. Breathe out normally. Place the mouthpiece in your mouth and seal your lips tightly around it. Breathe in slowly and as deeply as possible, raising the piston or the ball toward the top of the column. Hold your breath for 3-5 seconds or for as long as possible. Allow the piston or ball to fall to the bottom of the column. Remove the mouthpiece from your mouth and breathe out normally. Rest for a few seconds and repeat Steps 1 through 7 at least 10 times every 1-2 hours when you are awake. Take your time and take a few normal breaths between deep breaths. The spirometer may include an indicator to show your best effort. Use the indicator as a goal to work toward during each repetition. After each set of 10 deep breaths, practice coughing to be sure your lungs are clear. If you have an incision (the cut made at the time of surgery), support your incision when coughing by placing a pillow or rolled up towels firmly against it. Once you are able to get out of bed, walk around indoors and cough well. You may stop using the incentive spirometer when instructed by your caregiver.  RISKS AND COMPLICATIONS Take your time so you do not get dizzy or light-headed. If you are in pain, you may need to take or ask for pain medication before doing incentive spirometry. It is harder to take a deep breath if you are having pain. AFTER USE Rest and breathe slowly and easily. It can be helpful to keep track of a log of your progress. Your caregiver can provide you with a simple table to help  with this. If you are using the spirometer at home, follow these instructions: [place] IF:  You are having difficultly using the spirometer. You have trouble using the spirometer as often as instructed. Your pain medication is not giving enough relief while using the spirometer. You develop fever of 100.5 F (38.1 C) or higher. SEEK IMMEDIATE MEDICAL CARE IF:  You cough up bloody sputum that had not been present before. You develop fever of 102 F (38.9 C) or greater. You develop worsening pain at or near the incision site. MAKE SURE YOU:  Understand these instructions. Will watch your condition. Will get help right away if you are not doing well or get worse. Document Released: 04/05/2007 Document Revised: 02/15/2012 Document Reviewed: 06/06/2007 [facility] Patient Information 2014 [place], [place].   ________________________________________________________________________

## 2022-07-28 ENCOUNTER — Encounter (HOSPITAL_COMMUNITY): Payer: Self-pay

## 2022-07-28 ENCOUNTER — Other Ambulatory Visit: Payer: Self-pay

## 2022-07-28 ENCOUNTER — Encounter (HOSPITAL_COMMUNITY)
Admission: RE | Admit: 2022-07-28 | Discharge: 2022-07-28 | Disposition: A | Discharge status: Home / Self Care | Payer: PPO | Source: Ambulatory Visit | Attending: Orthopedic Surgery | Admitting: Orthopedic Surgery

## 2022-07-28 VITALS — BP 131/81 | HR 63 | Temp 97.5°F | Ht 71.0 in | Wt 231.0 lb

## 2022-07-28 DIAGNOSIS — R7303 Prediabetes: Secondary | ICD-10-CM

## 2022-07-28 DIAGNOSIS — I10 Essential (primary) hypertension: Secondary | ICD-10-CM | POA: Insufficient documentation

## 2022-07-28 DIAGNOSIS — M19012 Primary osteoarthritis, left shoulder: Secondary | ICD-10-CM | POA: Insufficient documentation

## 2022-07-28 DIAGNOSIS — Z01818 Encounter for other preprocedural examination: Secondary | ICD-10-CM | POA: Diagnosis not present

## 2022-07-28 DIAGNOSIS — M75102 Unspecified rotator cuff tear or rupture of left shoulder, not specified as traumatic: Secondary | ICD-10-CM | POA: Diagnosis not present

## 2022-07-28 DIAGNOSIS — Z87891 Personal history of nicotine dependence: Secondary | ICD-10-CM | POA: Insufficient documentation

## 2022-07-28 HISTORY — DX: Other pulmonary embolism without acute cor pulmonale: I26.99

## 2022-07-28 HISTORY — DX: Prediabetes: R73.03

## 2022-07-28 LAB — CBC
HCT: 41.3 % (ref 39.0–52.0)
Hemoglobin: 14 g/dL (ref 13.0–17.0)
MCH: 29.7 pg (ref 26.0–34.0)
MCHC: 33.9 g/dL (ref 30.0–36.0)
MCV: 87.5 fL (ref 80.0–100.0)
Platelets: 151 10*3/uL (ref 150–400)
RBC: 4.72 MIL/uL (ref 4.22–5.81)
RDW: 13.1 % (ref 11.5–15.5)
WBC: 5.9 10*3/uL (ref 4.0–10.5)
nRBC: 0 % (ref 0.0–0.2)

## 2022-07-28 LAB — SURGICAL PCR SCREEN
MRSA, PCR: NEGATIVE
Staphylococcus aureus: NEGATIVE

## 2022-07-28 LAB — BASIC METABOLIC PANEL
Anion gap: 8 (ref 5–15)
BUN: 15 mg/dL (ref 8–23)
CO2: 27 mmol/L (ref 22–32)
Calcium: 9.5 mg/dL (ref 8.9–10.3)
Chloride: 108 mmol/L (ref 98–111)
Creatinine, Ser: 1 mg/dL (ref 0.61–1.24)
GFR, Estimated: >60 mL/min (ref >60)
Glucose, Bld: 97 mg/dL (ref 70–99)
Potassium: 3.5 mmol/L (ref 3.5–5.1)
Sodium: 143 mmol/L (ref 135–145)

## 2022-07-28 LAB — GLUCOSE, CAPILLARY: Glucose-Capillary: 92 mg/dL (ref 70–99)

## 2022-07-28 NOTE — Progress Notes (Signed)
For Short Stay: COVID SWAB appointment date: Date of COVID positive in last 90 days:  Bowel Prep reminder:   For Anesthesia: PCP - Dr. Blane Ohara Cardiologist - Dr. Norman Herrlich.: Clearance: 07/09/22: EPIC  Chest x-ray -  EKG - 07/10/22 Stress Test -  ECHO - 07/15/22 Cardiac Cath -  Pacemaker/ICD device last checked: Pacemaker orders received: Device Rep notified:  Spinal Cord Stimulator:  Sleep Study -  CPAP -   Fasting Blood Sugar - N/A Checks Blood Sugar __0___ times a day Date and result of last Hgb A1c- 5.7: 06/18/22  Blood Thinner Instructions: Aspirin Instructions: Last Dose:  Activity level: Can go up a flight of stairs and activities of daily living without stopping and without chest pain and/or shortness of breath   Able to exercise without chest pain and/or shortness of breath   Unable to go up a flight of stairs without chest pain and/or shortness of breath     Anesthesia review: Hx: HTN,Heart murmur,PE  Patient denies shortness of breath, fever, cough and chest pain at PAT appointment   Patient verbalized understanding of instructions that were given to them at the PAT appointment. Patient was also instructed that they will need to review over the PAT instructions again at home before surgery.

## 2022-07-29 DIAGNOSIS — K222 Esophageal obstruction: Secondary | ICD-10-CM | POA: Diagnosis not present

## 2022-07-29 DIAGNOSIS — R1310 Dysphagia, unspecified: Secondary | ICD-10-CM | POA: Diagnosis not present

## 2022-07-29 DIAGNOSIS — Z801 Family history of malignant neoplasm of trachea, bronchus and lung: Secondary | ICD-10-CM | POA: Diagnosis not present

## 2022-07-29 DIAGNOSIS — K317 Polyp of stomach and duodenum: Secondary | ICD-10-CM | POA: Diagnosis not present

## 2022-07-29 DIAGNOSIS — K219 Gastro-esophageal reflux disease without esophagitis: Secondary | ICD-10-CM | POA: Diagnosis not present

## 2022-07-29 DIAGNOSIS — Z86711 Personal history of pulmonary embolism: Secondary | ICD-10-CM | POA: Diagnosis not present

## 2022-07-29 DIAGNOSIS — I10 Essential (primary) hypertension: Secondary | ICD-10-CM | POA: Diagnosis not present

## 2022-07-29 DIAGNOSIS — K449 Diaphragmatic hernia without obstruction or gangrene: Secondary | ICD-10-CM | POA: Diagnosis not present

## 2022-07-29 DIAGNOSIS — K2270 Barrett's esophagus without dysplasia: Secondary | ICD-10-CM | POA: Diagnosis not present

## 2022-07-29 DIAGNOSIS — Z87891 Personal history of nicotine dependence: Secondary | ICD-10-CM | POA: Diagnosis not present

## 2022-07-29 NOTE — Anesthesia Preprocedure Evaluation (Addendum)
Anesthesia Evaluation  Patient identified by MRN, date of birth, ID band Patient awake    Reviewed: Allergy & Precautions, NPO status , Patient's Chart, lab work & pertinent test results  Airway Mallampati: II  TM Distance: >3 FB Neck ROM: Full    Dental  (+) Dental Advisory Given   Pulmonary former smoker,    breath sounds clear to auscultation       Cardiovascular hypertension, Pt. on medications + Valvular Problems/Murmurs (Mild/mod AS- normal EF.) AS  Rhythm:Regular Rate:Normal     Neuro/Psych  Headaches,  Neuromuscular disease    GI/Hepatic Neg liver ROS, GERD  ,  Endo/Other  negative endocrine ROS  Renal/GU negative Renal ROS     Musculoskeletal  (+) Arthritis ,   Abdominal   Peds  Hematology negative hematology ROS (+)   Anesthesia Other Findings   Reproductive/Obstetrics                            Lab Results  Component Value Date   WBC 5.9 07/28/2022   HGB 14.0 07/28/2022   HCT 41.3 07/28/2022   MCV 87.5 07/28/2022   PLT 151 07/28/2022   Lab Results  Component Value Date   CREATININE 1.00 07/28/2022   BUN 15 07/28/2022   NA 143 07/28/2022   K 3.5 07/28/2022   CL 108 07/28/2022   CO2 27 07/28/2022    Anesthesia Physical Anesthesia Plan  ASA: 3  Anesthesia Plan: General   Post-op Pain Management: Regional block* and Tylenol PO (pre-op)*   Induction: Intravenous  PONV Risk Score and Plan: 2 and Dexamethasone, Ondansetron and Treatment may vary due to age or medical condition  Airway Management Planned: Oral ETT  Additional Equipment:   Intra-op Plan:   Post-operative Plan: Extubation in OR  Informed Consent: I have reviewed the patients History and Physical, chart, labs and discussed the procedure including the risks, benefits and alternatives for the proposed anesthesia with the patient or authorized representative who has indicated his/her understanding  and acceptance.     Dental advisory given  Plan Discussed with: CRNA  Anesthesia Plan Comments:        Anesthesia Quick Evaluation

## 2022-07-29 NOTE — Progress Notes (Signed)
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 former smoker with h/o HTN, mild to moderate AS, PE, left shoulder OA scheduled for above procedure 08/06/2022 with Dr. Francena Hanly.   Pt seen by cardiology 07/09/2022.  Per OV visit repeat echo ordered to evaluate progression of AS.  Per Dr. Dulce Sellar should not interfere with planned shoulder surgery as pt is asymptomatic and exam not consistent with severe stenosis.   Echo 07/14/2022 with stable mild to moderate AS.   Anticipate pt can proceed with planned procedure barring acute status change.   VS: BP 131/81   Pulse 63   Temp (!) 36.4 C (Oral)   Ht 5\' 11"  (1.803 m)   Wt 104.8 kg   SpO2 96%   BMI 32.22 kg/m   PROVIDERS: [facility] , MD is PCP   Cardiologist - Dr. Fritzi Mandes LABS: Labs reviewed: Acceptable for surgery. (all labs ordered are listed, but only abnormal results are displayed)  Labs Reviewed  SURGICAL PCR SCREEN  CBC  BASIC METABOLIC PANEL  GLUCOSE, CAPILLARY     IMAGES:   EKG:   CV: Echo 07/14/2022 1. Sigmid septum. Left ventricular ejection fraction, by estimation, is  60 to 65%. The left ventricle has normal function. The left ventricle has  no regional wall motion abnormalities. Left ventricular diastolic  parameters are consistent with Grade I  diastolic dysfunction (impaired relaxation).   2. Right ventricular systolic function is normal. The right ventricular  size is normal.   3. The mitral valve is normal in structure. No evidence of mitral valve  regurgitation. No evidence of mitral stenosis.   4. The aortic valve is normal in structure. There is mild calcification  of the aortic valve. There is mild thickening of the aortic  valve. Aortic  valve regurgitation is not visualized. Moderate aortic valve stenosis.  Aortic valve area, by VTI measures  1.09 cm. Aortic valve mean gradient measures 15.0 mmHg.   5. The inferior vena cava is normal in size with greater than 50%  respiratory variability, suggesting right atrial pressure of 3 mmHg.  Past Medical History:  Diagnosis Date   Acute pulmonary embolism without acute cor pulmonale (HCC) 09/02/2020   Arthritis    Degenerative lumbar spinal stenosis    Fatty liver    GERD (gastroesophageal reflux disease)    Headache    History of kidney stones    Hyperlipidemia    Hypertension    Mastodynia    Migraine without aura and without status migrainosus, not intractable    Neuritis or radiculitis due to rupture of lumbar intervertebral disc 07/15/2018   PE (pulmonary thromboembolism) (HCC)    Pre-diabetes    Radiculopathy of thoracolumbar region 07/15/2018   Radiculopathy of thoracolumbar region 07/15/2018    Past Surgical History:  Procedure Laterality Date   ANTERIOR LATERAL LUMBAR FUSION WITH PERCUTANEOUS SCREW 2 LEVEL N/A 07/13/2018   Procedure: ANTERIOR LATERAL LUMBAR FUSION L2-4;  Surgeon: 09/12/2018, MD;  Location: [facility] OR;  Service: Orthopedics;  Laterality: N/A;  4 hrs   CHOLECYSTECTOMY  1999   GALLBLADDER SURGERY  2016   NECK SURGERY  1995   Ruptured disc repair   SHOULDER SURGERY Left 2005   SHOULDER  SURGERY Right 2016   THROAT SURGERY     stretching    MEDICATIONS:  acetaminophen (TYLENOL) 325 MG tablet   Ascorbic Acid (VITAMIN C) 1000 MG tablet   Cholecalciferol (VITAMIN D) 50 MCG (2000 UT) tablet   clindamycin (CLEOCIN) 300 MG capsule   fexofenadine (ALLEGRA) 180 MG tablet   fluticasone (FLONASE) 50 MCG/ACT nasal spray   Multiple Vitamins-Minerals (PRESERVISION AREDS 2 PO)   nystatin-triamcinolone (MYCOLOG II) cream   omeprazole (PRILOSEC) 20 MG capsule   potassium chloride SA (KLOR-CON M) 20 MEQ tablet   rosuvastatin (CRESTOR) 10 MG  tablet   valsartan-hydrochlorothiazide (DIOVAN-HCT) 320-25 MG tablet   verapamil (CALAN-SR) 120 MG CR tablet   No current facility-administered medications for this encounter.     Jodell Cipro Ward, PA-C [facility] Pre-Surgical Testing [phone]

## 2022-08-06 ENCOUNTER — Encounter (HOSPITAL_COMMUNITY): Payer: Self-pay | Admitting: Orthopedic Surgery

## 2022-08-06 ENCOUNTER — Ambulatory Visit (HOSPITAL_BASED_OUTPATIENT_CLINIC_OR_DEPARTMENT_OTHER): Payer: PPO | Admitting: Certified Registered"

## 2022-08-06 ENCOUNTER — Ambulatory Visit (HOSPITAL_COMMUNITY)
Admission: RE | Admit: 2022-08-06 | Discharge: 2022-08-06 | Disposition: A | Discharge status: Home / Self Care | Payer: PPO | Attending: Orthopedic Surgery | Admitting: Orthopedic Surgery

## 2022-08-06 ENCOUNTER — Encounter (HOSPITAL_COMMUNITY)
Admission: RE | Disposition: A | Discharge status: Home / Self Care | Payer: Self-pay | Source: Home / Self Care | Attending: Orthopedic Surgery

## 2022-08-06 ENCOUNTER — Ambulatory Visit (HOSPITAL_COMMUNITY): Payer: PPO | Admitting: Physician Assistant

## 2022-08-06 ENCOUNTER — Other Ambulatory Visit: Payer: Self-pay

## 2022-08-06 DIAGNOSIS — Z87891 Personal history of nicotine dependence: Secondary | ICD-10-CM

## 2022-08-06 DIAGNOSIS — I10 Essential (primary) hypertension: Secondary | ICD-10-CM

## 2022-08-06 DIAGNOSIS — K219 Gastro-esophageal reflux disease without esophagitis: Secondary | ICD-10-CM | POA: Diagnosis not present

## 2022-08-06 DIAGNOSIS — Z79899 Other long term (current) drug therapy: Secondary | ICD-10-CM | POA: Diagnosis not present

## 2022-08-06 DIAGNOSIS — M12812 Other specific arthropathies, not elsewhere classified, left shoulder: Secondary | ICD-10-CM

## 2022-08-06 DIAGNOSIS — M1990 Unspecified osteoarthritis, unspecified site: Secondary | ICD-10-CM | POA: Insufficient documentation

## 2022-08-06 DIAGNOSIS — I350 Nonrheumatic aortic (valve) stenosis: Secondary | ICD-10-CM | POA: Diagnosis not present

## 2022-08-06 DIAGNOSIS — M19012 Primary osteoarthritis, left shoulder: Secondary | ICD-10-CM | POA: Diagnosis not present

## 2022-08-06 DIAGNOSIS — M75102 Unspecified rotator cuff tear or rupture of left shoulder, not specified as traumatic: Secondary | ICD-10-CM | POA: Insufficient documentation

## 2022-08-06 DIAGNOSIS — G8918 Other acute postprocedural pain: Secondary | ICD-10-CM | POA: Diagnosis not present

## 2022-08-06 HISTORY — PX: REVERSE SHOULDER ARTHROPLASTY: SHX5054

## 2022-08-06 SURGERY — ARTHROPLASTY, SHOULDER, TOTAL, REVERSE
Anesthesia: General | Site: Shoulder | Laterality: Left

## 2022-08-06 MED ORDER — VANCOMYCIN HCL 1000 MG IV SOLR
INTRAVENOUS | Status: AC
Start: 2022-08-06 — End: ?
  Filled 2022-08-06: qty 20

## 2022-08-06 MED ORDER — PHENYLEPHRINE HCL (PRESSORS) 10 MG/ML IV SOLN
INTRAVENOUS | Status: DC | PRN
  Administered 2022-08-06: 80 ug via INTRAVENOUS

## 2022-08-06 MED ORDER — LIDOCAINE HCL (PF) 2 % IJ SOLN
INTRAMUSCULAR | Status: AC
  Filled 2022-08-06: qty 5

## 2022-08-06 MED ORDER — ONDANSETRON HCL 4 MG/2ML IJ SOLN
INTRAMUSCULAR | Status: AC
Start: 2022-08-06 — End: ?
  Filled 2022-08-06: qty 2

## 2022-08-06 MED ORDER — ONDANSETRON HCL 4 MG/2ML IJ SOLN
INTRAMUSCULAR | Status: DC | PRN
  Administered 2022-08-06: 4 mg via INTRAVENOUS

## 2022-08-06 MED ORDER — SUGAMMADEX SODIUM 200 MG/2ML IV SOLN
INTRAVENOUS | Status: DC | PRN
  Administered 2022-08-06: 200 mg via INTRAVENOUS

## 2022-08-06 MED ORDER — TRAMADOL HCL 50 MG PO TABS: 50.0000 mg | ORAL_TABLET | Freq: Four times a day (QID) | ORAL | 0 refills | Status: DC | PRN

## 2022-08-06 MED ORDER — LIDOCAINE HCL (CARDIAC) PF 100 MG/5ML IV SOSY
PREFILLED_SYRINGE | INTRAVENOUS | Status: DC | PRN
  Administered 2022-08-06: 20 mg via INTRAVENOUS

## 2022-08-06 MED ORDER — ACETAMINOPHEN 500 MG PO TABS
1000.0000 mg | ORAL_TABLET | Freq: Once | ORAL | Status: AC
  Administered 2022-08-06: 1000 mg via ORAL
  Filled 2022-08-06: qty 2

## 2022-08-06 MED ORDER — LACTATED RINGERS IV SOLN: INTRAVENOUS | Status: DC

## 2022-08-06 MED ORDER — ONDANSETRON HCL 4 MG PO TABS: 4.0000 mg | ORAL_TABLET | Freq: Three times a day (TID) | ORAL | 0 refills | Status: DC | PRN

## 2022-08-06 MED ORDER — CEFAZOLIN SODIUM-DEXTROSE 2-4 GM/100ML-% IV SOLN
2.0000 g | INTRAVENOUS | Status: AC
  Administered 2022-08-06: 2 g via INTRAVENOUS
  Filled 2022-08-06: qty 100

## 2022-08-06 MED ORDER — TRANEXAMIC ACID-NACL 1000-0.7 MG/100ML-% IV SOLN
1000.0000 mg | INTRAVENOUS | Status: AC
  Administered 2022-08-06: 1000 mg via INTRAVENOUS
  Filled 2022-08-06: qty 100

## 2022-08-06 MED ORDER — NAPROXEN 500 MG PO TABS: 500.0000 mg | ORAL_TABLET | Freq: Two times a day (BID) | ORAL | 1 refills | Status: DC

## 2022-08-06 MED ORDER — ROCURONIUM BROMIDE 10 MG/ML (PF) SYRINGE
PREFILLED_SYRINGE | INTRAVENOUS | Status: AC
  Filled 2022-08-06: qty 10

## 2022-08-06 MED ORDER — LACTATED RINGERS IV BOLUS
250.0000 mL | Freq: Once | INTRAVENOUS | Status: AC
  Administered 2022-08-06: 250 mL via INTRAVENOUS

## 2022-08-06 MED ORDER — PHENYLEPHRINE HCL-NACL 20-0.9 MG/250ML-% IV SOLN
INTRAVENOUS | Status: DC | PRN
  Administered 2022-08-06: 40 ug/min via INTRAVENOUS

## 2022-08-06 MED ORDER — PROPOFOL 10 MG/ML IV BOLUS
INTRAVENOUS | Status: AC
  Filled 2022-08-06: qty 20

## 2022-08-06 MED ORDER — OXYCODONE-ACETAMINOPHEN 5-325 MG PO TABS: 1.0000 | ORAL_TABLET | ORAL | 0 refills | Status: DC | PRN

## 2022-08-06 MED ORDER — VANCOMYCIN HCL 1000 MG IV SOLR
INTRAVENOUS | Status: DC | PRN
  Administered 2022-08-06: 1000 mg via TOPICAL

## 2022-08-06 MED ORDER — AMISULPRIDE (ANTIEMETIC) 5 MG/2ML IV SOLN: 10.0000 mg | Freq: Once | INTRAVENOUS | Status: DC | PRN

## 2022-08-06 MED ORDER — ORAL CARE MOUTH RINSE: 15.0000 mL | Freq: Once | OROMUCOSAL | Status: AC

## 2022-08-06 MED ORDER — 0.9 % SODIUM CHLORIDE (POUR BTL) OPTIME
TOPICAL | Status: DC | PRN
  Administered 2022-08-06: 1000 mL

## 2022-08-06 MED ORDER — DEXAMETHASONE SODIUM PHOSPHATE 10 MG/ML IJ SOLN
INTRAMUSCULAR | Status: AC
Start: 2022-08-06 — End: ?
  Filled 2022-08-06: qty 1

## 2022-08-06 MED ORDER — TIZANIDINE HCL 4 MG PO TABS: 4.0000 mg | ORAL_TABLET | Freq: Three times a day (TID) | ORAL | 1 refills | Status: DC | PRN

## 2022-08-06 MED ORDER — FENTANYL CITRATE (PF) 100 MCG/2ML IJ SOLN
INTRAMUSCULAR | Status: AC
  Filled 2022-08-06: qty 2

## 2022-08-06 MED ORDER — FENTANYL CITRATE (PF) 100 MCG/2ML IJ SOLN
INTRAMUSCULAR | Status: DC | PRN
  Administered 2022-08-06 (×2): 50 ug via INTRAVENOUS

## 2022-08-06 MED ORDER — FENTANYL CITRATE PF 50 MCG/ML IJ SOSY: 25.0000 ug | PREFILLED_SYRINGE | INTRAMUSCULAR | Status: DC | PRN

## 2022-08-06 MED ORDER — DEXAMETHASONE SODIUM PHOSPHATE 10 MG/ML IJ SOLN
INTRAMUSCULAR | Status: DC | PRN
  Administered 2022-08-06: 8 mg via INTRAVENOUS

## 2022-08-06 MED ORDER — TRANEXAMIC ACID 1000 MG/10ML IV SOLN: 1000.0000 mg | INTRAVENOUS | Status: DC

## 2022-08-06 MED ORDER — BUPIVACAINE LIPOSOME 1.3 % IJ SUSP
INTRAMUSCULAR | Status: DC | PRN
  Administered 2022-08-06: 10 mL via PERINEURAL

## 2022-08-06 MED ORDER — PROPOFOL 10 MG/ML IV BOLUS
INTRAVENOUS | Status: DC | PRN
  Administered 2022-08-06: 120 mg via INTRAVENOUS

## 2022-08-06 MED ORDER — CHLORHEXIDINE GLUCONATE 0.12 % MT SOLN
15.0000 mL | Freq: Once | OROMUCOSAL | Status: AC
  Administered 2022-08-06: 15 mL via OROMUCOSAL

## 2022-08-06 MED ORDER — LACTATED RINGERS IV BOLUS
500.0000 mL | Freq: Once | INTRAVENOUS | Status: AC
  Administered 2022-08-06: 500 mL via INTRAVENOUS

## 2022-08-06 MED ORDER — BUPIVACAINE-EPINEPHRINE (PF) 0.5% -1:200000 IJ SOLN
INTRAMUSCULAR | Status: DC | PRN
  Administered 2022-08-06: 15 mL via PERINEURAL

## 2022-08-06 MED ORDER — PHENYLEPHRINE HCL-NACL 20-0.9 MG/250ML-% IV SOLN
INTRAVENOUS | Status: AC
  Filled 2022-08-06: qty 250

## 2022-08-06 SURGICAL SUPPLY — 80 items
ADH SKN CLS APL DERMABOND .7 (GAUZE/BANDAGES/DRESSINGS) ×1
AID PSTN UNV HD RSTRNT DISP (MISCELLANEOUS) ×1
BAG COUNTER SPONGE SURGICOUNT (BAG) IMPLANT
BAG SPEC THK2 15X12 ZIP CLS (MISCELLANEOUS) ×1
BAG SPNG CNTER NS LX DISP (BAG) ×1
BAG ZIPLOCK 12X15 (MISCELLANEOUS) ×1 IMPLANT
BLADE SAW SGTL 83.5X18.5 (BLADE) ×1 IMPLANT
BNDG CMPR 5X4 CHSV STRCH STRL (GAUZE/BANDAGES/DRESSINGS) ×1
BNDG COHESIVE 4X5 TAN STRL LF (GAUZE/BANDAGES/DRESSINGS) ×1 IMPLANT
BSPLAT GLND +2X24 MDLR (Joint) ×1 IMPLANT
COOLER ICEMAN CLASSIC (MISCELLANEOUS) ×1 IMPLANT
COVER BACK TABLE 60X90IN (DRAPES) ×1 IMPLANT
COVER SURGICAL LIGHT HANDLE (MISCELLANEOUS) ×1 IMPLANT
CUP SUT UNIV REVERS 42 NEUT (Shoulder) IMPLANT
DERMABOND ADVANCED (GAUZE/BANDAGES/DRESSINGS) ×1
DERMABOND ADVANCED .7 DNX12 (GAUZE/BANDAGES/DRESSINGS) ×1 IMPLANT
DRAPE INCISE IOBAN 66X45 STRL (DRAPES) IMPLANT
DRAPE ORTHO SPLIT 77X108 STRL (DRAPES) ×2
DRAPE SHEET LG 3/4 BI-LAMINATE (DRAPES) ×1 IMPLANT
DRAPE SURG 17X11 SM STRL (DRAPES) ×1 IMPLANT
DRAPE SURG ORHT 6 SPLT 77X108 (DRAPES) ×2 IMPLANT
DRAPE TOP 10253 STERILE (DRAPES) ×1 IMPLANT
DRAPE U-SHAPE 47X51 STRL (DRAPES) ×1 IMPLANT
DRESSING AQUACEL AG SP 3.5X6 (GAUZE/BANDAGES/DRESSINGS) ×1 IMPLANT
DRSG AQUACEL AG ADV 3.5X 6 (GAUZE/BANDAGES/DRESSINGS) IMPLANT
DRSG AQUACEL AG ADV 3.5X10 (GAUZE/BANDAGES/DRESSINGS) IMPLANT
DRSG AQUACEL AG SP 3.5X6 (GAUZE/BANDAGES/DRESSINGS) ×1
DRSG TEGADERM 8X12 (GAUZE/BANDAGES/DRESSINGS) ×1 IMPLANT
DURAPREP 26ML APPLICATOR (WOUND CARE) ×1 IMPLANT
ELECT BLADE TIP CTD 4 INCH (ELECTRODE) ×1 IMPLANT
ELECT PENCIL ROCKER SW 15FT (MISCELLANEOUS) ×1 IMPLANT
ELECT REM PT RETURN 15FT ADLT (MISCELLANEOUS) ×1 IMPLANT
FACESHIELD WRAPAROUND (MASK) ×4 IMPLANT
FACESHIELD WRAPAROUND OR TEAM (MASK) ×4 IMPLANT
GLENOID SYS 42 +4 LAT/24 SHLDR (Miscellaneous) IMPLANT
GLENOID UNI REV MOD 24 +2 LAT (Joint) IMPLANT
GLOVE BIO SURGEON STRL SZ7.5 (GLOVE) ×1 IMPLANT
GLOVE BIO SURGEON STRL SZ8 (GLOVE) ×1 IMPLANT
GLOVE SS BIOGEL STRL SZ 7 (GLOVE) ×1 IMPLANT
GLOVE SS BIOGEL STRL SZ 7.5 (GLOVE) ×1 IMPLANT
GLOVE SUPERSENSE BIOGEL SZ 7 (GLOVE) ×1
GLOVE SUPERSENSE BIOGEL SZ 7.5 (GLOVE) ×1
GOWN STRL REIN XL XLG (GOWN DISPOSABLE) ×2 IMPLANT
HUMERAL INSERT (Orthopedic Implant) IMPLANT
INSERT HUM CUP L/42 +6 (Insert) IMPLANT
KIT BASIN OR (CUSTOM PROCEDURE TRAY) ×1 IMPLANT
KIT TURNOVER KIT A (KITS) IMPLANT
MANIFOLD NEPTUNE II (INSTRUMENTS) ×1 IMPLANT
NDL TAPERED W/ NITINOL LOOP (MISCELLANEOUS) ×1 IMPLANT
NEEDLE TAPERED W/ NITINOL LOOP (MISCELLANEOUS) ×1 IMPLANT
NS IRRIG 1000ML POUR BTL (IV SOLUTION) ×1 IMPLANT
PACK SHOULDER (CUSTOM PROCEDURE TRAY) ×1 IMPLANT
PAD ARMBOARD 7.5X6 YLW CONV (MISCELLANEOUS) ×1 IMPLANT
PAD COLD SHLDR WRAP-ON (PAD) ×1 IMPLANT
PIN NITINOL TARGETER 2.8 (PIN) IMPLANT
PIN SET MODULAR GLENOID SYSTEM (PIN) IMPLANT
RESTRAINT HEAD UNIVERSAL NS (MISCELLANEOUS) ×1 IMPLANT
SCREW CENTRAL MOD 30MM (Screw) IMPLANT
SCREW PERI LOCK 5.5X16 (Screw) IMPLANT
SCREW PERI LOCK 5.5X24 (Screw) IMPLANT
SCREW PERI LOCK 5.5X32 (Screw) IMPLANT
SCREW PERI LOCK 5.5X36 (Screw) IMPLANT
SLING ARM FOAM STRAP LRG (SOFTGOODS) IMPLANT
SLING ARM FOAM STRAP MED (SOFTGOODS) IMPLANT
SPONGE T-LAP 4X18 ~~LOC~~+RFID (SPONGE) ×1 IMPLANT
STEM HUM UNIV REV SZ13 (Stem) IMPLANT
SUCTION FRAZIER HANDLE 12FR (TUBING) ×1
SUCTION TUBE FRAZIER 12FR DISP (TUBING) ×1 IMPLANT
SUT FIBERWIRE #2 38 T-5 BLUE (SUTURE)
SUT MNCRL AB 3-0 PS2 18 (SUTURE) ×1 IMPLANT
SUT MON AB 2-0 CT1 36 (SUTURE) ×1 IMPLANT
SUT VIC AB 1 CT1 36 (SUTURE) ×1 IMPLANT
SUTURE FIBERWR #2 38 T-5 BLUE (SUTURE) IMPLANT
SUTURE TAPE 1.3 40 TPR END (SUTURE) ×2 IMPLANT
SUTURETAPE 1.3 40 TPR END (SUTURE) ×2
TOWEL OR 17X26 10 PK STRL BLUE (TOWEL DISPOSABLE) ×1 IMPLANT
TOWEL OR NON WOVEN STRL DISP B (DISPOSABLE) ×1 IMPLANT
TUBE SUCTION HIGH CAP CLEAR NV (SUCTIONS) ×1 IMPLANT
WATER STERILE IRR 1000ML POUR (IV SOLUTION) ×2 IMPLANT
YANKAUER SUCT BULB TIP 10FT TU (MISCELLANEOUS) IMPLANT

## 2022-08-06 NOTE — Evaluation (Signed)
Occupational Therapy Evaluation Patient Details Name: Jesse [name]. MRN: [id number] DOB: Aug 01, 1941 Today's Date: 08/06/2022   History of Present Illness Patient is an 81 year old man s/p left reverse shoulder replacement.   Clinical Impression   Mr. Dnaiel Voller is an 81 year old man s/p shoulder replacement without functional use of right dominant upper extremity secondary to effects of surgery and interscalene block and shoulder precautions. Therapist provided education and instruction to patient and spouse in regards to exercises, precautions, positioning, donning upper extremity clothing and bathing while maintaining shoulder precautions, ice and edema management and donning/doffing sling. Patient and spouse verbalized understanding and demonstrated as needed. Patient's daughters present for education as well. Patient needed assistance to donn shirt, underwear, and pants, sand provided with instruction on compensatory strategies to perform ADLs. Patient to follow up with MD for further therapy needs.        Recommendations for follow up therapy are one component of a multi-disciplinary discharge planning process, led by the attending physician.  Recommendations may be updated based on patient status, additional functional criteria and insurance authorization.   Follow Up Recommendations  Follow physician's recommendations for discharge plan and follow up therapies    Assistance Recommended at Discharge Intermittent Supervision/Assistance  Patient can return home with the following A little help with bathing/dressing/bathroom;Assistance with cooking/housework    Functional Status Assessment  Patient has had a recent decline in their functional status and demonstrates the ability to make significant improvements in function in a reasonable and predictable amount of time.  Equipment Recommendations  None recommended by OT    Recommendations for Other Services       Precautions  / Restrictions Precautions Precautions: Shoulder Type of Shoulder Precautions: If sitting in controlled environment, ok to come out of sling to give neck a break. Please sleep in it to protect until follow up in office.     OK to use operative arm for feeding, hygiene and ADLs.   Ok to instruct Pendulums and lap slides as exercises. Ok to use operative arm within the following parameters for ADL purposes     New ROM (9/35)   Ok for PROM, AAROM, AROM within pain tolerance and within the following ROM   ER 20   ABD 45   FE 60 Shoulder Interventions: Shoulder sling/immobilizer;Off for dressing/bathing/exercises Precaution Booklet Issued: Yes (comment)      Mobility Bed Mobility                    Transfers Overall transfer level: Independent                        Balance Overall balance assessment: No apparent balance deficits (not formally assessed)                                         ADL either performed or assessed with clinical judgement   ADL                                               Vision Baseline Vision/History: 1 Wears glasses Ability to See in Adequate Light: 1 Impaired       Perception     Praxis  Pertinent Vitals/Pain Pain Assessment Pain Assessment: No/denies pain     Hand Dominance     Extremity/Trunk Assessment Upper Extremity Assessment Upper Extremity Assessment: LUE deficits/detail LUE Deficits / Details: impaired sensation and motor control secondary to block           Communication     Cognition Arousal/Alertness: Awake/alert Behavior During Therapy: WFL for tasks assessed/performed Overall Cognitive Status: Within Functional Limits for tasks assessed                                       General Comments       Exercises     Shoulder Instructions Shoulder Instructions Donning/doffing shirt without moving shoulder: Independent Method for sponge bathing  under operated UE: Independent Donning/doffing sling/immobilizer: Caregiver independent with task Correct positioning of sling/immobilizer: Independent Pendulum exercises (written home exercise program): Caregiver independent with task;Independent ROM for elbow, wrist and digits of operated UE: Independent Sling wearing schedule (on at all times/off for ADL's): Independent Proper positioning of operated UE when showering: Independent Dressing change: Independent Positioning of UE while sleeping: Independent    Home Living Family/patient expects to be discharged to:: Private residence Living Arrangements: Spouse/significant other;Children                                      Prior Functioning/Environment                          OT Problem List: Decreased strength;Decreased range of motion;Pain;Impaired UE functional use      OT Treatment/Interventions:      OT Goals(Current goals can be found in the care plan section) Acute Rehab OT Goals OT Goal Formulation: All assessment and education complete, DC therapy  OT Frequency:      Co-evaluation              AM-PAC OT "6 Clicks" Daily Activity     Outcome Measure Help from another person eating meals?: A Little Help from another person taking care of personal grooming?: A Little Help from another person toileting, which includes using toliet, bedpan, or urinal?: A Little Help from another person bathing (including washing, rinsing, drying)?: A Little Help from another person to put on and taking off regular upper body clothing?: A Lot Help from another person to put on and taking off regular lower body clothing?: A Little 6 Click Score: 17   End of Session Nurse Communication:  (Ot education complete)  Activity Tolerance: Patient tolerated treatment well Patient left: in chair;with family/visitor present  OT Visit Diagnosis: Pain                Time: 7169-6789 OT Time Calculation (min): 28  min Charges:  OT General Charges $OT Visit: 1 Visit OT Evaluation $OT Eval Low Complexity: 1 Low OT Treatments $Self Care/Home Management : 8-22 mins  Deborrah Mabin, OTR/L Acute Care Rehab Services  Office [phone] Pager: [phone]   Kelli Churn 08/06/2022, 11:52 AM

## 2022-08-06 NOTE — Anesthesia Postprocedure Evaluation (Signed)
Anesthesia Post Note  Patient: Jesse [name].  Procedure(s) Performed: REVERSE SHOULDER ARTHROPLASTY (Left: Shoulder)     Patient location during evaluation: PACU Anesthesia Type: General Level of consciousness: awake and alert Pain management: pain level controlled Vital Signs Assessment: post-procedure vital signs reviewed and stable Respiratory status: spontaneous breathing, nonlabored ventilation, respiratory function stable and patient connected to nasal cannula oxygen Cardiovascular status: blood pressure returned to baseline and stable Postop Assessment: no apparent nausea or vomiting Anesthetic complications: no   No notable events documented.  Last Vitals:  Vitals:   08/06/22 1000 08/06/22 1015  BP: 127/65 (!) 152/78  Pulse: 62 (!) 57  Resp: 14 16  Temp: 36.6 C   SpO2: 95% 98%    Last Pain:  Vitals:   08/06/22 1015  TempSrc:   PainSc: 0-No pain                 Kennieth Rad

## 2022-08-06 NOTE — Op Note (Signed)
08/06/2022  9:22 AM  PATIENT:   Jesse [name].  81 y.o. male  PRE-OPERATIVE DIAGNOSIS:  Left shoulder rotator cuff tear arthropathy  POST-OPERATIVE DIAGNOSIS: Same  PROCEDURE: Left shoulder reverse arthroplasty utilizing a press-fit size 13 Arthrex stem with a neutral metaphysis, +6 constrained liner, 42/+4 glenosphere on a small/+2 baseplate  SURGEON:  Maiyah Goyne, Vania Rea M.D.  ASSISTANTS: Ralene Bathe, PA-C  ANESTHESIA:   General endotracheal and interscalene block with Exparel  EBL: 150 cc  SPECIMEN: None  Drains: None   PATIENT DISPOSITION:  PACU - hemodynamically stable.    PLAN OF CARE: Discharge to home after PACU  Brief history:  Jesse [name] is an 81 year old gentleman who has had chronic and progressively increasing left shoulder pain related to severe arthritis and profound rotator cuff dysfunction.  He has a remote history of rotator cuff repair with recent MRI confirming significant attenuation of the rotator cuff tendon.  Due to his increasing pain and functional limitations and failure to respond to prolonged attempts at conservative management, he is brought to the operating this time for planned left shoulder reverse arthroplasty.  Preoperatively, I counseled the patient regarding treatment options and risks versus benefits thereof.  Possible surgical complications were all reviewed including potential for bleeding, infection, neurovascular injury, persistent pain, loss of motion, anesthetic complication, failure of the implant, and possible need for additional surgery. They understand and accept and agrees with our planned procedure.   Procedure in detail:  After undergoing routine preop evaluation the patient received prophylactic antibiotics and interscalene block with Exparel was established in the holding area by the anesthesia department.  Patient was subsequently placed spine on the operating table and underwent the smooth induction of a general  endotracheal anesthesia.  Placed into the beachchair position and appropriately padded and protected.  The left shoulder girdle region was sterilely prepped and draped in standard fashion.  Timeout was called.  A deltopectoral approach left shoulder is made to an approximately 10 cm incision.  Skin flaps were elevated dissection carried deeply the deltopectoral interval was then opened from proximal to this with the vein taken laterally.  Dense adhesions were noted beneath the deltoid from the previous deltoid splitting approach for rotator cuff repair and these were carefully divided.  Conjoined tendon mobilized and retracted medially.  The long head biceps tendon was then unroofed and tenodesed at the upper border the pectoralis major tendon with proximal segment unroofed and excised.  The rotator cuff was then split from the apex of the bicipital groove to the base of the coracoid and the subscapularis was then separated from the lesser tuberosity using electrocautery and the free margin was tagged with a pair of suture tape sutures.  Capsular attachments were then divided from the anterior and inferior margins of the humeral neck allowing deliver the humeral head through the wound.  An extra medullary guide was then used to outline the proposed humeral head resection which we performed with an oscillating saw at approximately 20 degrees retroversion.  A rondure was then used to remove the marginal osteophytes from the humeral neck and a metal cap was placed over the Metaphyseal surface which we measured at a 42.  At this point the glenoid was then exposed and a circumferential labral resection was completed.  Guidepin directed into the center of the glenoid the glenoid was then reamed with the central followed by the peripheral reamer to a stable subchondral bone bed.  Additional bony debris was carefully removed in preparation  completed with the central drill and tapped for a 30 mm lag screw.  Our baseplate was  then assembled and was then inserted with some vancomycin powder applied to the threads of the lag screw baseplate was then inserted with excellent purchase and fixation.  All of the peripheral locking screws were then placed using standard technique with excellent fixation.  A 42/+4 glenosphere was then impacted onto the baseplate and the central locking screw was placed.  Attention returned to the humeral metaphysis where the canal was opened and we ultimately broached up to a size 13 stem at approximately 20 degrees retroversion.  A neutral metaphyseal reaming guide was then used to prepare the metaphysis.  Our final implant was then impacted with excellent purchase and fixation after the vancomycin powder had been spread into the canal.  A series of trial reductions ultimately showed that the +6 poly with a constrained construct gave is the best motion stability and soft tissue balance.  Trial were then removed.  The final +6 poly constrained was then impacted onto the implant after was cleaned and dried.  A final reduction showed again excellent motion stability and soft tissue balance all much to our satisfaction.  The wound was copious irrigated.  Good elasticity of the subscapularis was confirmed and it was then repaired back to the eyelets on the collar the implant using the previously placed suture tape sutures.  The arm easily achieved 45 degrees of external Tatian without excessive tension on the subscap repair.  At this point irrigation completed.  Hemostasis was obtained.  The balance of the vancomycin powder was then spread liberally throughout the deep soft tissue planes.  The deltopectoral interval was reapproximated with a series of figure-of-eight number Vicryl sutures.  2-0 Monocryl used to close the subcu layer and intracuticular 3-0 Monocryl used to close the skin followed by Dermabond and Aquacel dressing.  Left arm placed into a sling and the patient was awakened, extubated, and taken to the  recovery room in stable condition.  Ralene Bathe, PA-C was utilized as an Geophysicist/field seismologist throughout this case, essential for help with positioning the patient, positioning extremity, tissue manipulation, implantation of the prosthesis, suture management, wound closure, and intraoperative decision-making.  Senaida Lange MD   Contact # [phone]

## 2022-08-06 NOTE — Transfer of Care (Signed)
Immediate Anesthesia Transfer of Care Note  Patient: Jesse [name].  Procedure(s) Performed: REVERSE SHOULDER ARTHROPLASTY (Left: Shoulder)  Patient Location: PACU  Anesthesia Type:General  Level of Consciousness: awake  Airway & Oxygen Therapy: Patient Spontanous Breathing  Post-op Assessment: Report given to RN  Post vital signs: stable  Last Vitals:  Vitals Value Taken Time  BP 136/74 08/06/22 0935  Temp 36.6 C 08/06/22 0934  Pulse 59 08/06/22 0938  Resp 15 08/06/22 0938  SpO2 100 % 08/06/22 0938  Vitals shown include unvalidated device data.  Last Pain:  Vitals:   08/06/22 0627  TempSrc:   PainSc: 0-No pain         Complications: No notable events documented.

## 2022-08-06 NOTE — H&P (Signed)
Jesse [name].    Chief Complaint: Left shoulder rotator cuff tear arthropathy HPI: The patient is a 81 y.o. male with chronic and progressively increasing left shoulder pain related to severe rotator cuff tear arthropathy.  Due to his increasing functional limitations and failure to respond to prolonged attempts at conservative management, he is brought to the operating room at this time for planned left shoulder reverse arthroplasty.  Past Medical History:  Diagnosis Date   Acute pulmonary embolism without acute cor pulmonale (HCC) 09/02/2020   Arthritis    Degenerative lumbar spinal stenosis    Fatty liver    GERD (gastroesophageal reflux disease)    Headache    History of kidney stones    Hyperlipidemia    Hypertension    Mastodynia    Migraine without aura and without status migrainosus, not intractable    Neuritis or radiculitis due to rupture of lumbar intervertebral disc 07/15/2018   PE (pulmonary thromboembolism) (HCC)    Pre-diabetes    Radiculopathy of thoracolumbar region 07/15/2018   Radiculopathy of thoracolumbar region 07/15/2018      Past Surgical History:  Procedure Laterality Date   ANTERIOR LATERAL LUMBAR FUSION WITH PERCUTANEOUS SCREW 2 LEVEL N/A 07/13/2018   Procedure: ANTERIOR LATERAL LUMBAR FUSION L2-4;  Surgeon: Venita Lick, MD;  Location: [facility] OR;  Service: Orthopedics;  Laterality: N/A;  4 hrs   CHOLECYSTECTOMY  1999   GALLBLADDER SURGERY  2016   NECK SURGERY  1995   Ruptured disc repair   SHOULDER SURGERY Left 2005   SHOULDER SURGERY Right 2016   THROAT SURGERY     stretching    Family History  Problem Relation Age of Onset   Hypertension Mother    Cancer Father    Heart disease Sister    Diabetes Brother    Migraines Neg Hx     Social History:  reports that he quit smoking about 43 years ago. His smoking use included cigarettes. He has never used smokeless tobacco. He reports that he does not drink alcohol and does not use  drugs.  BMI: Estimated body mass index is 32.22 kg/m as calculated from the following:   Height as of 07/28/22: 5\' 11"  (1.803 m).   Weight as of this encounter: 104.8 kg.  Lab Results  Component Value Date   ALBUMIN 4.4 06/18/2022   Diabetes: Patient does not have a diagnosis of diabetes. Lab Results  Component Value Date   HGBA1C 5.7 (H) 06/18/2022     Smoking Status: Social History   Tobacco Use  Smoking Status Former   Years: 15.00   Types: Cigarettes   Quit date: 1980   Years since quitting: 43.6  Smokeless Tobacco Never  Tobacco Comments   quit at least 42 years ago    The patient is not currently a tobacco user. Counseling given: Not Answered Tobacco comments: quit at least 42 years ago       Medications Prior to Admission  Medication Sig Dispense Refill   acetaminophen (TYLENOL) 325 MG tablet Take 2 tablets (650 mg total) by mouth every 4 (four) hours as needed for mild pain, fever or headache.     Ascorbic Acid (VITAMIN C) 1000 MG tablet Take 1,000 mg by mouth daily.     Cholecalciferol (VITAMIN D) 50 MCG (2000 UT) tablet Take 2,000 Units by mouth daily.     clindamycin (CLEOCIN) 300 MG capsule Take 600 mg (2 capsules) 1 hour pre-dental procedure. 2 capsule 0   fexofenadine (ALLEGRA)  180 MG tablet Take 180 mg by mouth daily.     fluticasone (FLONASE) 50 MCG/ACT nasal spray Place 2 sprays into both nostrils daily as needed for allergies.     Multiple Vitamins-Minerals (PRESERVISION AREDS 2 PO) Take 1 capsule by mouth in the morning and at bedtime.     nystatin-triamcinolone (MYCOLOG II) cream Apply 1 Application topically 2 (two) times daily. (Patient taking differently: Apply 1 Application topically daily.) 30 g 1   omeprazole (PRILOSEC) 20 MG capsule Take 20 mg by mouth daily before breakfast.      potassium chloride SA (KLOR-CON M) 20 MEQ tablet Take 1 tablet (20 mEq total) by mouth daily. 90 tablet 1   rosuvastatin (CRESTOR) 10 MG tablet Take 1 tablet (10 mg  total) by mouth daily. 90 tablet 1   valsartan-hydrochlorothiazide (DIOVAN-HCT) 320-25 MG tablet Take 1 tablet by mouth daily. 90 tablet 0   verapamil (CALAN-SR) 120 MG CR tablet Take 1 tablet (120 mg total) by mouth 2 (two) times daily. 180 tablet 1     Physical Exam: Left shoulder demonstrates painful and guarded motion as noted at recent office visits.  He has globally decreased strength to manual muscle testing.  Otherwise is neurovascular intact in left upper extremity.  Plain radiographs confirm changes consistent with chronic rotator cuff tear arthropathy of the left shoulder.  Vitals  Temp:  [98.3 F (36.8 C)] 98.3 F (36.8 C) (08/31 0624) Pulse Rate:  [66] 66 (08/31 0624) Resp:  [18] 18 (08/31 0624) BP: (147)/(67) 147/67 (08/31 0624) SpO2:  [96 %] 96 % (08/31 0624) Weight:  [104.8 kg] 104.8 kg (08/31 0627)  Assessment/Plan  Impression: Left shoulder rotator cuff tear arthropathy  Plan of Action: Procedure(s): REVERSE SHOULDER ARTHROPLASTY  Tyrese Capriotti M Kaelea Gathright 08/06/2022, 6:31 AM Contact # [phone]

## 2022-08-06 NOTE — Anesthesia Procedure Notes (Signed)
Anesthesia Regional Block: Interscalene brachial plexus block   Pre-Anesthetic Checklist: , timeout performed,  Correct Patient, Correct Site, Correct Laterality,  Correct Procedure, Correct Position, site marked,  Risks and benefits discussed,  Surgical consent,  Pre-op evaluation,  At surgeon's request and post-op pain management  Laterality: Left  Prep: chloraprep       Needles:  Injection technique: Single-shot  Needle Type: Echogenic Stimulator Needle     Needle Length: 9cm  Needle Gauge: 21     Additional Needles:   Procedures:, nerve stimulator,,, ultrasound used (permanent image in chart),,     Nerve Stimulator or Paresthesia:  Response: deltoid and bicep, 0.6 mA  Additional Responses:   Narrative:  Start time: 08/06/2022 6:52 AM End time: 08/06/2022 6:58 AM Injection made incrementally with aspirations every 5 mL.  Performed by: Personally  Anesthesiologist: Marcene Duos, MD

## 2022-08-06 NOTE — Discharge Instructions (Signed)

## 2022-08-06 NOTE — Anesthesia Procedure Notes (Signed)
Procedure Name: Intubation Date/Time: 08/06/2022 7:43 AM  Performed by: Atlee Kluth, Forest Gleason, CRNAPre-anesthesia Checklist: Patient identified, Emergency Drugs available, Suction available, Patient being monitored and Timeout performed Patient Re-evaluated:Patient Re-evaluated prior to induction Oxygen Delivery Method: Circle system utilized Preoxygenation: Pre-oxygenation with 100% oxygen Induction Type: IV induction Ventilation: Mask ventilation without difficulty Laryngoscope Size: Mac and 4 Grade View: Grade I Tube type: Oral Tube size: 7.5 mm Number of attempts: 1 Airway Equipment and Method: Stylet Secured at: 22 cm Tube secured with: Tape Dental Injury: Teeth and Oropharynx as per pre-operative assessment

## 2022-08-07 ENCOUNTER — Encounter (HOSPITAL_COMMUNITY): Payer: Self-pay | Admitting: Orthopedic Surgery

## 2022-08-17 ENCOUNTER — Other Ambulatory Visit: Payer: Self-pay | Admitting: Nurse Practitioner

## 2022-08-17 DIAGNOSIS — Z5189 Encounter for other specified aftercare: Secondary | ICD-10-CM | POA: Diagnosis not present

## 2022-08-17 DIAGNOSIS — I10 Essential (primary) hypertension: Secondary | ICD-10-CM

## 2022-08-19 DIAGNOSIS — H269 Unspecified cataract: Secondary | ICD-10-CM | POA: Diagnosis not present

## 2022-08-19 DIAGNOSIS — H2511 Age-related nuclear cataract, right eye: Secondary | ICD-10-CM | POA: Diagnosis not present

## 2022-09-02 DIAGNOSIS — H43393 Other vitreous opacities, bilateral: Secondary | ICD-10-CM | POA: Diagnosis not present

## 2022-09-02 DIAGNOSIS — H353130 Nonexudative age-related macular degeneration, bilateral, stage unspecified: Secondary | ICD-10-CM | POA: Diagnosis not present

## 2022-09-02 DIAGNOSIS — H269 Unspecified cataract: Secondary | ICD-10-CM | POA: Diagnosis not present

## 2022-09-02 DIAGNOSIS — H2512 Age-related nuclear cataract, left eye: Secondary | ICD-10-CM | POA: Diagnosis not present

## 2022-09-02 DIAGNOSIS — H35032 Hypertensive retinopathy, left eye: Secondary | ICD-10-CM | POA: Diagnosis not present

## 2022-09-03 DIAGNOSIS — M6281 Muscle weakness (generalized): Secondary | ICD-10-CM | POA: Diagnosis not present

## 2022-09-03 DIAGNOSIS — M25512 Pain in left shoulder: Secondary | ICD-10-CM | POA: Diagnosis not present

## 2022-09-08 DIAGNOSIS — M6281 Muscle weakness (generalized): Secondary | ICD-10-CM | POA: Diagnosis not present

## 2022-09-08 DIAGNOSIS — M25512 Pain in left shoulder: Secondary | ICD-10-CM | POA: Diagnosis not present

## 2022-09-11 DIAGNOSIS — M25512 Pain in left shoulder: Secondary | ICD-10-CM | POA: Diagnosis not present

## 2022-09-11 DIAGNOSIS — M6281 Muscle weakness (generalized): Secondary | ICD-10-CM | POA: Diagnosis not present

## 2022-09-12 ENCOUNTER — Other Ambulatory Visit: Payer: Self-pay | Admitting: Nurse Practitioner

## 2022-09-12 DIAGNOSIS — I10 Essential (primary) hypertension: Secondary | ICD-10-CM

## 2022-09-13 ENCOUNTER — Other Ambulatory Visit: Payer: Self-pay | Admitting: Family Medicine

## 2022-09-13 DIAGNOSIS — I10 Essential (primary) hypertension: Secondary | ICD-10-CM

## 2022-09-14 DIAGNOSIS — M25512 Pain in left shoulder: Secondary | ICD-10-CM | POA: Diagnosis not present

## 2022-09-14 DIAGNOSIS — M6281 Muscle weakness (generalized): Secondary | ICD-10-CM | POA: Diagnosis not present

## 2022-09-16 DIAGNOSIS — M6281 Muscle weakness (generalized): Secondary | ICD-10-CM | POA: Diagnosis not present

## 2022-09-16 DIAGNOSIS — M25512 Pain in left shoulder: Secondary | ICD-10-CM | POA: Diagnosis not present

## 2022-09-22 ENCOUNTER — Encounter: Payer: Self-pay | Admitting: Nurse Practitioner

## 2022-09-22 DIAGNOSIS — M6281 Muscle weakness (generalized): Secondary | ICD-10-CM | POA: Diagnosis not present

## 2022-09-22 DIAGNOSIS — M25512 Pain in left shoulder: Secondary | ICD-10-CM | POA: Diagnosis not present

## 2022-09-24 ENCOUNTER — Ambulatory Visit (INDEPENDENT_AMBULATORY_CARE_PROVIDER_SITE_OTHER): Payer: PPO | Admitting: Family Medicine

## 2022-09-24 VITALS — BP 130/70 | HR 74 | Temp 96.9°F | Resp 16 | Ht 71.0 in | Wt 231.0 lb

## 2022-09-24 DIAGNOSIS — R7303 Prediabetes: Secondary | ICD-10-CM | POA: Diagnosis not present

## 2022-09-24 DIAGNOSIS — Z23 Encounter for immunization: Secondary | ICD-10-CM | POA: Diagnosis not present

## 2022-09-24 DIAGNOSIS — I10 Essential (primary) hypertension: Secondary | ICD-10-CM | POA: Diagnosis not present

## 2022-09-24 DIAGNOSIS — E782 Mixed hyperlipidemia: Secondary | ICD-10-CM

## 2022-09-24 MED ORDER — BENZONATATE 100 MG PO CAPS: 100.0000 mg | ORAL_CAPSULE | Freq: Three times a day (TID) | ORAL | 0 refills | Status: DC | PRN

## 2022-09-24 MED ORDER — AZITHROMYCIN 250 MG PO TABS
ORAL_TABLET | ORAL | 0 refills | Status: DC
Start: 2022-09-24 — End: 2022-09-24

## 2022-09-24 NOTE — Progress Notes (Signed)
Subjective:  Patient ID: Jesse [name]., male    DOB: 07/07/1941  Age: 81 y.o. MRN: [id number]  Chief Complaint  Patient presents with   Hypertension   Prediabetes    HPI Prediabetes: Patient is not taking any medication, but he is eating healthy and low carbs diet. Exercising.   Hypertension: He is taking Valsartan/HYDROCHLOROTHIAZIDE 320/25 mg daily and verapamil 120 mg at bedtime.   GERD: He takes Omeprazole 20 mg daily. Patient saw Dr. Jennye Boroughs and did EGD with stricture and dilitation..  Allergic rhinitis: on allegra daily. Simple saline.  Hyperlipidemia: On crestor 10 mg before bed.  LDL mildly elevated. He eats healthy.   08/06/2022: cataract surgery [facility].     Current Outpatient Medications on File Prior to Visit  Medication Sig Dispense Refill   acetaminophen (TYLENOL) 325 MG tablet Take 2 tablets (650 mg total) by mouth every 4 (four) hours as needed for mild pain, fever or headache.     Ascorbic Acid (VITAMIN C) 1000 MG tablet Take 1,000 mg by mouth daily.     Cholecalciferol (VITAMIN D) 50 MCG (2000 UT) tablet Take 2,000 Units by mouth daily.     fexofenadine (ALLEGRA) 180 MG tablet Take 180 mg by mouth daily.     fluticasone (FLONASE) 50 MCG/ACT nasal spray Place 2 sprays into both nostrils daily as needed for allergies.     Multiple Vitamins-Minerals (PRESERVISION AREDS 2 PO) Take 1 capsule by mouth in the morning and at bedtime.     omeprazole (PRILOSEC) 20 MG capsule Take 20 mg by mouth daily before breakfast.      potassium chloride SA (KLOR-CON M) 20 MEQ tablet Take 1 tablet (20 mEq total) by mouth daily. 90 tablet 1   rosuvastatin (CRESTOR) 10 MG tablet Take 1 tablet (10 mg total) by mouth daily. 90 tablet 1   valsartan-hydrochlorothiazide (DIOVAN-HCT) 320-25 MG tablet TAKE 1 TABLET BY MOUTH EVERY DAY 90 tablet 0   verapamil (CALAN-SR) 120 MG CR tablet TAKE 1 TABLET BY MOUTH 2 TIMES DAILY. 180 tablet 1   nystatin-triamcinolone (MYCOLOG II) cream Apply 1  Application topically 2 (two) times daily. (Patient not taking: Reported on 09/24/2022) 30 g 1   No current facility-administered medications on file prior to visit.   Past Medical History:  Diagnosis Date   Acute pulmonary embolism without acute cor pulmonale (HCC) 09/02/2020   Arthritis    Degenerative lumbar spinal stenosis    Fatty liver    GERD (gastroesophageal reflux disease)    Headache    History of kidney stones    Hyperlipidemia    Hypertension    Mastodynia    Migraine without aura and without status migrainosus, not intractable    Neuritis or radiculitis due to rupture of lumbar intervertebral disc 07/15/2018   PE (pulmonary thromboembolism) (HCC)    Pre-diabetes    Radiculopathy of thoracolumbar region 07/15/2018   Radiculopathy of thoracolumbar region 07/15/2018   Past Surgical History:  Procedure Laterality Date   ANTERIOR LATERAL LUMBAR FUSION WITH PERCUTANEOUS SCREW 2 LEVEL N/A 07/13/2018   Procedure: ANTERIOR LATERAL LUMBAR FUSION L2-4;  Surgeon: Venita Lick, MD;  Location: [facility] OR;  Service: Orthopedics;  Laterality: N/A;  4 hrs   CHOLECYSTECTOMY  1999   GALLBLADDER SURGERY  2016   NECK SURGERY  1995   Ruptured disc repair   REVERSE SHOULDER ARTHROPLASTY Left 08/06/2022   Procedure: REVERSE SHOULDER ARTHROPLASTY;  Surgeon: Francena Hanly, MD;  Location: [facility];  Service: Orthopedics;  Laterality:  Left;  152min   SHOULDER SURGERY Left 2005   SHOULDER SURGERY Right 2016   THROAT SURGERY     stretching    Family History  Problem Relation Age of Onset   Hypertension Mother    Cancer Father    Heart disease Sister    Diabetes Brother    Migraines Neg Hx    Social History   Socioeconomic History   Marital status: Married    Spouse name: [name]   Number of children: 4   Years of education: 12+   Highest education level: Not on file  Occupational History   Occupation: Retired  Tobacco Use   Smoking status: Former    Years: 15.00    Types: Cigarettes     Quit date: 1980    Years since quitting: 43.8   Smokeless tobacco: Never   Tobacco comments:    quit at least 5 years ago   Vaping Use   Vaping Use: Never used  Substance and Sexual Activity   Alcohol use: No   Drug use: No   Sexual activity: Not Currently  Other Topics Concern   Not on file  Social History Narrative   Lives with wife   Caffeine use: Drinks decaf coffee and tea   Writes right-handed, eats and shaves left-handed   Social Determinants of Health   Financial Resource Strain: Low Risk  (05/06/2022)   Overall Financial Resource Strain (CARDIA)    Difficulty of Paying Living Expenses: Not hard at all  Food Insecurity: No Food Insecurity (05/06/2022)   Hunger Vital Sign    Worried About Running Out of Food in the Last Year: Never true    [place] in the Last Year: Never true  Transportation Needs: No Transportation Needs (05/06/2022)   PRAPARE - Hydrologist (Medical): No    Lack of Transportation (Non-Medical): No  Physical Activity: Insufficiently Active (05/06/2022)   Exercise Vital Sign    Days of Exercise per Week: 3 days    Minutes of Exercise per Session: 30 min  Stress: No Stress Concern Present (05/06/2022)   [place]    Feeling of Stress : Not at all  Social Connections: Not on file    Review of Systems  Constitutional:  Negative for fatigue.  HENT:  Negative for congestion, ear pain and sore throat.   Respiratory:  Negative for cough and shortness of breath.   Cardiovascular:  Negative for chest pain and palpitations.  Gastrointestinal:  Negative for abdominal pain, constipation, diarrhea, nausea and vomiting.  Genitourinary:  Positive for frequency. Negative for dysuria and urgency.  Musculoskeletal:  Positive for arthralgias (left shoulder pain (shoulder replacement 08/31), bilateral hip pain.) and back pain. Negative for myalgias.   Neurological:  Positive for headaches. Negative for dizziness.  Psychiatric/Behavioral:  Negative for dysphoric mood and sleep disturbance. The patient is not nervous/anxious.      Objective:  BP 130/70   Pulse 74   Temp (!) 96.9 F (36.1 C)   Resp 16   Ht 5\' 11"  (1.803 m)   Wt 231 lb (104.8 kg)   BMI 32.22 kg/m      09/24/2022    8:04 AM 08/06/2022   11:15 AM 08/06/2022   10:15 AM  BP/Weight  Systolic BP 932 671 245  Diastolic BP 70 64 78  Wt. (Lbs) 231    BMI 32.22 kg/m2      Physical Exam  Vitals reviewed.  Constitutional:      Appearance: Normal appearance.  Cardiovascular:     Rate and Rhythm: Normal rate and regular rhythm.  Pulmonary:     Effort: Pulmonary effort is normal.     Breath sounds: Normal breath sounds.  Abdominal:     General: Bowel sounds are normal.     Palpations: Abdomen is soft.     Tenderness: There is no abdominal tenderness.  Musculoskeletal:     Cervical back: Normal range of motion.  Neurological:     Mental Status: He is alert and oriented to person, place, and time.  Psychiatric:        Mood and Affect: Mood normal.        Behavior: Behavior normal.     Diabetic Foot Exam - Simple   No data filed      Lab Results  Component Value Date   WBC 5.9 07/28/2022   HGB 14.0 07/28/2022   HCT 41.3 07/28/2022   PLT 151 07/28/2022   GLUCOSE 97 07/28/2022   CHOL 112 09/24/2022   TRIG 111 09/24/2022   HDL 45 09/24/2022   LDLCALC 47 09/24/2022   ALT 35 06/18/2022   AST 26 06/18/2022   NA 143 07/28/2022   K 3.5 07/28/2022   CL 108 07/28/2022   CREATININE 1.00 07/28/2022   BUN 15 07/28/2022   CO2 27 07/28/2022   INR 1.6 (H) 09/02/2020   HGBA1C 5.7 (H) 09/24/2022      Assessment & Plan:   Problem List Items Addressed This Visit       Cardiovascular and Mediastinum   Essential hypertension - Primary    Well controlled.  No changes to medicines.  Valsartan/HYDROCHLOROTHIAZIDE 320/25 mg daily and verapamil 120 mg at  bedtime.  Continue to work on eating a healthy diet and exercise.  Labs drawn today.         Other   Prediabetes    Hemoglobin A1c 5.7%, 3 month avg of blood sugars, is in prediabetic range.  In order to prevent progression to diabetes, recommend low carb diet and regular exercise      Relevant Orders   Hemoglobin A1c (Completed)   Cardiovascular Risk Assessment (Completed)   Mixed hyperlipidemia    Well controlled.  No changes to medicines. crestor 10 mg before bed Continue to work on eating a healthy diet and exercise.  Labs drawn today.       Relevant Orders   Lipid panel (Completed)   Other Visit Diagnoses     Need for influenza vaccination       Relevant Orders   Flu Vaccine QUAD High Dose(Fluad) (Completed)     .  Meds ordered this encounter  Medications   DISCONTD: azithromycin (ZITHROMAX) 250 MG tablet    Sig: 2 DAILY FOR FIRST DAY, THEN DECREASE TO ONE DAILY FOR 4 MORE DAYS.    Dispense:  6 tablet    Refill:  0   DISCONTD: benzonatate (TESSALON PERLES) 100 MG capsule    Sig: Take 1 capsule (100 mg total) by mouth 3 (three) times daily as needed for cough.    Dispense:  30 capsule    Refill:  0    Orders Placed This Encounter  Procedures   Flu Vaccine QUAD High Dose(Fluad)   Lipid panel   Hemoglobin A1c   Cardiovascular Risk Assessment    Follow-up: Return in about 6 months (around 03/26/2023) for chronic fasting.  An After Visit Summary was printed and given  to the patient.  [name], MD [facility] [phone]

## 2022-09-25 DIAGNOSIS — M6281 Muscle weakness (generalized): Secondary | ICD-10-CM | POA: Diagnosis not present

## 2022-09-25 DIAGNOSIS — M25512 Pain in left shoulder: Secondary | ICD-10-CM | POA: Diagnosis not present

## 2022-09-25 LAB — LIPID PANEL
Chol/HDL Ratio: 2.5 ratio (ref 0.0–5.0)
Cholesterol, Total: 112 mg/dL (ref 100–199)
HDL: 45 mg/dL
LDL Chol Calc (NIH): 47 mg/dL (ref 0–99)
Triglycerides: 111 mg/dL (ref 0–149)
VLDL Cholesterol Cal: 20 mg/dL (ref 5–40)

## 2022-09-25 LAB — CARDIOVASCULAR RISK ASSESSMENT

## 2022-09-25 LAB — HEMOGLOBIN A1C
Est. average glucose Bld gHb Est-mCnc: 117 mg/dL
Hgb A1c MFr Bld: 5.7 % — ABNORMAL HIGH (ref 4.8–5.6)

## 2022-09-27 NOTE — Progress Notes (Signed)
Cholesterol: great! Continue current medications. HBA1C: 5.7.

## 2022-09-30 DIAGNOSIS — M6281 Muscle weakness (generalized): Secondary | ICD-10-CM | POA: Diagnosis not present

## 2022-09-30 DIAGNOSIS — M25512 Pain in left shoulder: Secondary | ICD-10-CM | POA: Diagnosis not present

## 2022-10-01 NOTE — Assessment & Plan Note (Signed)
Hemoglobin A1c 5.7%, 3 month avg of blood sugars, is in prediabetic range.  In order to prevent progression to diabetes, recommend low carb diet and regular exercise  

## 2022-10-01 NOTE — Assessment & Plan Note (Signed)
Well controlled.  No changes to medicines.  Valsartan/HYDROCHLOROTHIAZIDE 320/25 mg daily and verapamil 120 mg at bedtime.  Continue to work on eating a healthy diet and exercise.  Labs drawn today.

## 2022-10-01 NOTE — Assessment & Plan Note (Signed)
Well controlled.  No changes to medicines. crestor 10 mg before bed Continue to work on eating a healthy diet and exercise.  Labs drawn today.

## 2022-10-02 ENCOUNTER — Encounter: Payer: Self-pay | Admitting: Family Medicine

## 2022-10-02 DIAGNOSIS — M6281 Muscle weakness (generalized): Secondary | ICD-10-CM | POA: Diagnosis not present

## 2022-10-02 DIAGNOSIS — M25512 Pain in left shoulder: Secondary | ICD-10-CM | POA: Diagnosis not present

## 2022-10-06 DIAGNOSIS — M25512 Pain in left shoulder: Secondary | ICD-10-CM | POA: Diagnosis not present

## 2022-10-06 DIAGNOSIS — M6281 Muscle weakness (generalized): Secondary | ICD-10-CM | POA: Diagnosis not present

## 2022-10-09 DIAGNOSIS — M6281 Muscle weakness (generalized): Secondary | ICD-10-CM | POA: Diagnosis not present

## 2022-10-09 DIAGNOSIS — M25512 Pain in left shoulder: Secondary | ICD-10-CM | POA: Diagnosis not present

## 2022-10-15 DIAGNOSIS — M25512 Pain in left shoulder: Secondary | ICD-10-CM | POA: Diagnosis not present

## 2022-10-15 DIAGNOSIS — M6281 Muscle weakness (generalized): Secondary | ICD-10-CM | POA: Diagnosis not present

## 2022-10-20 DIAGNOSIS — M25512 Pain in left shoulder: Secondary | ICD-10-CM | POA: Diagnosis not present

## 2022-10-20 DIAGNOSIS — Z96612 Presence of left artificial shoulder joint: Secondary | ICD-10-CM | POA: Insufficient documentation

## 2022-10-20 DIAGNOSIS — M6281 Muscle weakness (generalized): Secondary | ICD-10-CM | POA: Diagnosis not present

## 2022-10-23 DIAGNOSIS — M25512 Pain in left shoulder: Secondary | ICD-10-CM | POA: Diagnosis not present

## 2022-10-23 DIAGNOSIS — M6281 Muscle weakness (generalized): Secondary | ICD-10-CM | POA: Diagnosis not present

## 2022-10-28 DIAGNOSIS — Z96612 Presence of left artificial shoulder joint: Secondary | ICD-10-CM | POA: Diagnosis not present

## 2022-10-28 DIAGNOSIS — Z471 Aftercare following joint replacement surgery: Secondary | ICD-10-CM | POA: Diagnosis not present

## 2022-11-20 ENCOUNTER — Other Ambulatory Visit: Payer: Self-pay | Admitting: Physician Assistant

## 2022-11-20 DIAGNOSIS — I10 Essential (primary) hypertension: Secondary | ICD-10-CM

## 2022-12-01 ENCOUNTER — Telehealth: Payer: Self-pay | Admitting: Nurse Practitioner

## 2022-12-01 ENCOUNTER — Other Ambulatory Visit: Payer: Self-pay | Admitting: Nurse Practitioner

## 2022-12-01 DIAGNOSIS — R1032 Left lower quadrant pain: Secondary | ICD-10-CM

## 2022-12-01 DIAGNOSIS — Z8719 Personal history of other diseases of the digestive system: Secondary | ICD-10-CM

## 2022-12-01 DIAGNOSIS — K5792 Diverticulitis of intestine, part unspecified, without perforation or abscess without bleeding: Secondary | ICD-10-CM

## 2022-12-01 MED ORDER — LEVOFLOXACIN 750 MG PO TABS: 750.0000 mg | ORAL_TABLET | Freq: Every day | ORAL | 0 refills | Status: DC

## 2022-12-01 MED ORDER — METRONIDAZOLE 500 MG PO TABS: 500.0000 mg | ORAL_TABLET | Freq: Three times a day (TID) | ORAL | 0 refills | Status: AC

## 2022-12-01 NOTE — Telephone Encounter (Signed)
Pt called after hours phone, reporting LLQ abd pain, states he has history of diverticulitis. Reports symptom onset was a few days ago. Denies fever or rectal bleeding. Pt encouraged to rest bowel, push clear liquid diet and advance to full liquids as tolerated. Prescriptions Flagyl and Levaquin sent to pharmacy. Recommended pt present to ED if symptoms worsen or fail to improve. Pt verbalized understanding.

## 2022-12-03 ENCOUNTER — Ambulatory Visit (INDEPENDENT_AMBULATORY_CARE_PROVIDER_SITE_OTHER): Payer: PPO | Admitting: Family Medicine

## 2022-12-03 ENCOUNTER — Ambulatory Visit (HOSPITAL_COMMUNITY)
Admission: RE | Admit: 2022-12-03 | Discharge: 2022-12-03 | Disposition: A | Discharge status: Home / Self Care | Payer: PPO | Source: Ambulatory Visit | Attending: Family Medicine | Admitting: Family Medicine

## 2022-12-03 ENCOUNTER — Encounter: Payer: Self-pay | Admitting: Family Medicine

## 2022-12-03 VITALS — BP 134/68 | HR 68 | Temp 97.7°F | Ht 71.0 in | Wt 233.4 lb

## 2022-12-03 DIAGNOSIS — K4090 Unilateral inguinal hernia, without obstruction or gangrene, not specified as recurrent: Secondary | ICD-10-CM | POA: Diagnosis not present

## 2022-12-03 DIAGNOSIS — R1032 Left lower quadrant pain: Secondary | ICD-10-CM | POA: Insufficient documentation

## 2022-12-03 DIAGNOSIS — R110 Nausea: Secondary | ICD-10-CM | POA: Insufficient documentation

## 2022-12-03 DIAGNOSIS — N3289 Other specified disorders of bladder: Secondary | ICD-10-CM | POA: Diagnosis not present

## 2022-12-03 MED ORDER — HYDROCODONE-ACETAMINOPHEN 5-325 MG PO TABS: 1.0000 | ORAL_TABLET | Freq: Four times a day (QID) | ORAL | 0 refills | Status: DC | PRN

## 2022-12-03 MED ORDER — IOHEXOL 300 MG/ML  SOLN
100.0000 mL | Freq: Once | INTRAMUSCULAR | Status: AC | PRN
Start: 2022-12-03 — End: 2022-12-03
  Administered 2022-12-03: 100 mL via INTRAVENOUS

## 2022-12-03 MED ORDER — CEFTRIAXONE SODIUM 1 G IJ SOLR
1.0000 g | Freq: Once | INTRAMUSCULAR | Status: AC
  Administered 2022-12-03: 1 g via INTRAMUSCULAR

## 2022-12-03 MED ORDER — ONDANSETRON HCL 4 MG PO TABS: 4.0000 mg | ORAL_TABLET | Freq: Three times a day (TID) | ORAL | 0 refills | Status: DC | PRN

## 2022-12-03 NOTE — Assessment & Plan Note (Signed)
DDX: diverticulitis vs pancreatitis. Failing outpatient treatment with levaquin and flagyl.  Rocephin shot given.  Stat CT scan of abd/pelvis with contrast. Labs stat

## 2022-12-03 NOTE — Progress Notes (Signed)
Acute Office Visit  Subjective:    Patient ID: Jesse [name]., male    DOB: 07/21/1941, 81 y.o.   MRN: [id number]  Chief Complaint  Patient presents with   Left sided abdominal pain    HPI Patient is in today for LLQ abdominal pain. Constant pain. No diarrhea or constipation. No bloody or black bms. Patient called after hours line on 12/01/2022 and was sent flagyl and levaquin for presumed diverticulitis. He was put on bowel rest, clear diet. Patient is rating his pain 7/10. It has gotten worse, not better.  Patient is very stoic.   Past Medical History:  Diagnosis Date   Acute pulmonary embolism without acute cor pulmonale (HCC) 09/02/2020   Arthritis    Degenerative lumbar spinal stenosis    Fatty liver    GERD (gastroesophageal reflux disease)    Headache    History of kidney stones    Hyperlipidemia    Hypertension    Mastodynia    Migraine without aura and without status migrainosus, not intractable    Neuritis or radiculitis due to rupture of lumbar intervertebral disc 07/15/2018   PE (pulmonary thromboembolism) (HCC)    Pre-diabetes    Radiculopathy of thoracolumbar region 07/15/2018   Radiculopathy of thoracolumbar region 07/15/2018    Past Surgical History:  Procedure Laterality Date   ANTERIOR LATERAL LUMBAR FUSION WITH PERCUTANEOUS SCREW 2 LEVEL N/A 07/13/2018   Procedure: ANTERIOR LATERAL LUMBAR FUSION L2-4;  Surgeon: Melina Schools, MD;  Location: [address];  Service: Orthopedics;  Laterality: N/A;  4 hrs   CHOLECYSTECTOMY  1999   GALLBLADDER SURGERY  2016   NECK SURGERY  1995   Ruptured disc repair   REVERSE SHOULDER ARTHROPLASTY Left 08/06/2022   Procedure: REVERSE SHOULDER ARTHROPLASTY;  Surgeon: Justice Britain, MD;  Location: [facility];  Service: Orthopedics;  Laterality: Left;  166mn   SHOULDER SURGERY Left 2005   SHOULDER SURGERY Right 2016   THROAT SURGERY     stretching    Family History  Problem Relation Age of Onset   Hypertension Mother     Cancer Father    Heart disease Sister    Diabetes Brother    Migraines Neg Hx     Social History   Socioeconomic History   Marital status: Married    Spouse name: JBethena Roys  Number of children: 4   Years of education: 12+   Highest education level: Not on file  Occupational History   Occupation: Retired  Tobacco Use   Smoking status: Former    Years: 15.00    Types: Cigarettes    Quit date: 1980    Years since quitting: 44.0   Smokeless tobacco: Never   Tobacco comments:    quit at least 480years ago   Vaping Use   Vaping Use: Never used  Substance and Sexual Activity   Alcohol use: No   Drug use: No   Sexual activity: Not Currently  Other Topics Concern   Not on file  Social History Narrative   Lives with wife   Caffeine use: Drinks decaf coffee and tea   Writes right-handed, eats and shaves left-handed   Social Determinants of Health   Financial Resource Strain: Low Risk  (05/06/2022)   Overall Financial Resource Strain (CARDIA)    Difficulty of Paying Living Expenses: Not hard at all  Food Insecurity: No Food Insecurity (05/06/2022)   Hunger Vital Sign    Worried About Running Out of Food in the Last  Year: Never true    [place] in the Last Year: Never true  Transportation Needs: No Transportation Needs (05/06/2022)   PRAPARE - Hydrologist (Medical): No    Lack of Transportation (Non-Medical): No  Physical Activity: Insufficiently Active (05/06/2022)   Exercise Vital Sign    Days of Exercise per Week: 3 days    Minutes of Exercise per Session: 30 min  Stress: No Stress Concern Present (05/06/2022)   [place]    Feeling of Stress : Not at all  Social Connections: Not on file  Intimate Partner Violence: Not At Risk (05/06/2022)   Humiliation, Afraid, Rape, and Kick questionnaire    Fear of Current or Ex-Partner: No    Emotionally Abused: No    Physically  Abused: No    Sexually Abused: No    Outpatient Medications Prior to Visit  Medication Sig Dispense Refill   fexofenadine (ALLEGRA) 180 MG tablet Take 180 mg by mouth daily.     fluticasone (FLONASE) 50 MCG/ACT nasal spray Place 2 sprays into both nostrils daily as needed for allergies.     levofloxacin (LEVAQUIN) 750 MG tablet Take 1 tablet (750 mg total) by mouth daily. 10 tablet 0   metroNIDAZOLE (FLAGYL) 500 MG tablet Take 1 tablet (500 mg total) by mouth 3 (three) times daily for 7 days. 21 tablet 0   Multiple Vitamins-Minerals (PRESERVISION AREDS 2 PO) Take 1 capsule by mouth in the morning and at bedtime.     nystatin-triamcinolone (MYCOLOG II) cream Apply 1 Application topically 2 (two) times daily. 30 g 1   omeprazole (PRILOSEC) 20 MG capsule Take 20 mg by mouth daily before breakfast.      potassium chloride SA (KLOR-CON M) 20 MEQ tablet Take 1 tablet (20 mEq total) by mouth daily. 90 tablet 1   rosuvastatin (CRESTOR) 10 MG tablet Take 1 tablet (10 mg total) by mouth daily. 90 tablet 1   valsartan-hydrochlorothiazide (DIOVAN-HCT) 320-25 MG tablet TAKE 1 TABLET BY MOUTH EVERY DAY 90 tablet 0   verapamil (CALAN-SR) 120 MG CR tablet TAKE 1 TABLET BY MOUTH EVERYDAY AT BEDTIME 90 tablet 1   acetaminophen (TYLENOL) 325 MG tablet Take 2 tablets (650 mg total) by mouth every 4 (four) hours as needed for mild pain, fever or headache.     Ascorbic Acid (VITAMIN C) 1000 MG tablet Take 1,000 mg by mouth daily.     Cholecalciferol (VITAMIN D) 50 MCG (2000 UT) tablet Take 2,000 Units by mouth daily.     No facility-administered medications prior to visit.    Allergies  Allergen Reactions   Penicillins Rash    Review of Systems  Constitutional:  Negative for appetite change, fatigue and fever.  HENT:  Negative for congestion, ear pain, sinus pressure and sore throat.   Respiratory:  Negative for cough, shortness of breath and wheezing.   Cardiovascular:  Negative for chest pain and  palpitations.  Gastrointestinal:  Positive for abdominal pain (Left sided pain- being treated for possible diverticulitis.) and nausea. Negative for constipation and diarrhea.  Genitourinary:  Negative for dysuria and hematuria.  Musculoskeletal:  Negative for arthralgias, back pain, joint swelling and myalgias.  Skin:  Negative for rash.  Neurological:  Negative for dizziness, weakness and headaches.  Psychiatric/Behavioral:  Negative for dysphoric mood. The patient is not nervous/anxious.        Objective:    Physical Exam Vitals reviewed.  Constitutional:  Appearance: Normal appearance. He is normal weight.  Cardiovascular:     Rate and Rhythm: Normal rate and regular rhythm.     Heart sounds: Normal heart sounds.  Pulmonary:     Effort: Pulmonary effort is normal.     Breath sounds: Normal breath sounds.  Abdominal:     General: Abdomen is flat. Bowel sounds are normal.     Palpations: Abdomen is soft.     Tenderness: There is abdominal tenderness (LLQ.). There is no guarding or rebound.  Neurological:     Mental Status: He is alert and oriented to person, place, and time.  Psychiatric:        Mood and Affect: Mood normal.        Behavior: Behavior normal.     BP 134/68 (BP Location: Right Arm, Patient Position: Sitting)   Pulse 68   Temp 97.7 F (36.5 C) (Temporal)   Ht _0  (1.803 m)   Wt 233 lb 6.4 oz (105.9 kg)   SpO2 97%   BMI 32.55 kg/m  Wt Readings from Last 3 Encounters:  12/03/22 233 lb 6.4 oz (105.9 kg)  09/24/22 231 lb (104.8 kg)  08/06/22 231 lb (104.8 kg)    Health Maintenance Due  Topic Date Due   Zoster Vaccines- Shingrix (1 of 2) Never done   COVID-19 Vaccine (5 - 2023-24 season) 08/07/2022    There are no preventive care reminders to display for this patient.   No results found for: "TSH" Lab Results  Component Value Date   WBC 5.9 07/28/2022   HGB 14.0 07/28/2022   HCT 41.3 07/28/2022   MCV 87.5 07/28/2022   PLT 151  07/28/2022   Lab Results  Component Value Date   NA 143 07/28/2022   K 3.5 07/28/2022   CO2 27 07/28/2022   GLUCOSE 97 07/28/2022   BUN 15 07/28/2022   CREATININE 1.00 07/28/2022   BILITOT 0.5 06/18/2022   ALKPHOS 70 06/18/2022   AST 26 06/18/2022   ALT 35 06/18/2022   PROT 6.9 06/18/2022   ALBUMIN 4.4 06/18/2022   CALCIUM 9.5 07/28/2022   ANIONGAP 8 07/28/2022   EGFR 71 06/18/2022   Lab Results  Component Value Date   CHOL 112 09/24/2022   Lab Results  Component Value Date   HDL 45 09/24/2022   Lab Results  Component Value Date   LDLCALC 47 09/24/2022   Lab Results  Component Value Date   TRIG 111 09/24/2022   Lab Results  Component Value Date   CHOLHDL 2.5 09/24/2022   Lab Results  Component Value Date   HGBA1C 5.7 (H) 09/24/2022         Assessment & Plan:   Problem List Items Addressed This Visit       Other   Left lower quadrant abdominal pain - Primary    DDX: diverticulitis vs pancreatitis. Failing outpatient treatment with levaquin and flagyl.  Rocephin shot given.  Stat CT scan of abd/pelvis with contrast. Labs stat       Relevant Medications   HYDROcodone-acetaminophen (NORCO/VICODIN) 5-325 MG tablet   ondansetron (ZOFRAN) 4 MG tablet   cefTRIAXone (ROCEPHIN) injection 1 g   Other Relevant Orders   CBC with Differential/Platelet   Comprehensive metabolic panel   CT ABDOMEN PELVIS W CONTRAST   Amylase   Lipase   Nausea   Relevant Medications   cefTRIAXone (ROCEPHIN) injection 1 g   Other Relevant Orders   Amylase   Lipase  I,Lauren M Auman,acting as a scribe for [name], MD.,have documented all relevant documentation on the behalf of [name], MD,as directed by  [name], MD while in the presence of [name], MD.    [name], MD

## 2022-12-04 ENCOUNTER — Ambulatory Visit: Payer: PPO

## 2022-12-04 ENCOUNTER — Ambulatory Visit (INDEPENDENT_AMBULATORY_CARE_PROVIDER_SITE_OTHER): Payer: PPO

## 2022-12-04 DIAGNOSIS — R1032 Left lower quadrant pain: Secondary | ICD-10-CM | POA: Diagnosis not present

## 2022-12-04 DIAGNOSIS — R110 Nausea: Secondary | ICD-10-CM | POA: Diagnosis not present

## 2022-12-04 LAB — CBC WITH DIFFERENTIAL/PLATELET
Basophils Absolute: 0.1 10*3/uL (ref 0.0–0.2)
Basos: 1 %
EOS (ABSOLUTE): 0.1 10*3/uL (ref 0.0–0.4)
Eos: 2 %
Hematocrit: 45.2 % (ref 37.5–51.0)
Hemoglobin: 15.1 g/dL (ref 13.0–17.7)
Immature Grans (Abs): 0 10*3/uL (ref 0.0–0.1)
Immature Granulocytes: 0 %
Lymphocytes Absolute: 1.4 10*3/uL (ref 0.7–3.1)
Lymphs: 21 %
MCH: 29 pg (ref 26.6–33.0)
MCHC: 33.4 g/dL (ref 31.5–35.7)
MCV: 87 fL (ref 79–97)
Monocytes Absolute: 0.4 10*3/uL (ref 0.1–0.9)
Monocytes: 5 %
Neutrophils Absolute: 4.6 10*3/uL (ref 1.4–7.0)
Neutrophils: 71 %
Platelets: 175 10*3/uL (ref 150–450)
RBC: 5.21 x10E6/uL (ref 4.14–5.80)
RDW: 13.4 % (ref 11.6–15.4)
WBC: 6.6 10*3/uL (ref 3.4–10.8)

## 2022-12-04 LAB — POCT URINALYSIS DIPSTICK
Bilirubin, UA: NEGATIVE
Blood, UA: NEGATIVE
Glucose, UA: NEGATIVE
Ketones, UA: NEGATIVE
Leukocytes, UA: NEGATIVE
Nitrite, UA: NEGATIVE
Protein, UA: NEGATIVE
Spec Grav, UA: 1.01 (ref 1.010–1.025)
Urobilinogen, UA: NEGATIVE E.U./dL — AB
pH, UA: 6 (ref 5.0–8.0)

## 2022-12-04 LAB — COMPREHENSIVE METABOLIC PANEL
ALT: 27 IU/L (ref 0–44)
AST: 29 IU/L (ref 0–40)
Albumin/Globulin Ratio: 2 (ref 1.2–2.2)
Albumin: 4.4 g/dL (ref 3.7–4.7)
Alkaline Phosphatase: 78 IU/L (ref 44–121)
BUN/Creatinine Ratio: 12 (ref 10–24)
BUN: 16 mg/dL (ref 8–27)
Bilirubin Total: 0.5 mg/dL (ref 0.0–1.2)
CO2: 25 mmol/L (ref 20–29)
Calcium: 9.6 mg/dL (ref 8.6–10.2)
Chloride: 99 mmol/L (ref 96–106)
Creatinine, Ser: 1.39 mg/dL — ABNORMAL HIGH (ref 0.76–1.27)
Globulin, Total: 2.2 g/dL (ref 1.5–4.5)
Glucose: 166 mg/dL — ABNORMAL HIGH (ref 70–99)
Potassium: 3.7 mmol/L (ref 3.5–5.2)
Sodium: 139 mmol/L (ref 134–144)
Total Protein: 6.6 g/dL (ref 6.0–8.5)
eGFR: 51 mL/min/{1.73_m2} — ABNORMAL LOW (ref >59)

## 2022-12-04 LAB — AMYLASE: Amylase: 38 U/L (ref 31–110)

## 2022-12-04 LAB — LIPASE: Lipase: 25 U/L (ref 13–78)

## 2022-12-04 NOTE — Progress Notes (Signed)
Per provider Blane Ohara, MD: Patient was given instruction to Stop Flagyl and complete Levaquin. Also recommended to use miralax once daily and do stat labs today.

## 2022-12-05 ENCOUNTER — Other Ambulatory Visit: Payer: Self-pay | Admitting: Family Medicine

## 2022-12-05 ENCOUNTER — Telehealth: Payer: Self-pay | Admitting: Family Medicine

## 2022-12-05 MED ORDER — PREDNISONE 50 MG PO TABS: 50.0000 mg | ORAL_TABLET | Freq: Every day | ORAL | 0 refills | Status: DC

## 2022-12-05 NOTE — Telephone Encounter (Signed)
Patient called Saturday am. No bm yet. Eating better. Walked around in [place] for about 2 hours last night. Pain has changed and is now a burning shooting pain from left hip up to left lumbar back and left lower abdomen.  Increase miralax to twice daily.  Sent prednisone 50 mg daily x 5 days. Dr. Sedalia Muta

## 2022-12-13 ENCOUNTER — Other Ambulatory Visit: Payer: Self-pay | Admitting: Family Medicine

## 2022-12-14 ENCOUNTER — Other Ambulatory Visit: Payer: Self-pay | Admitting: Family Medicine

## 2022-12-14 DIAGNOSIS — I10 Essential (primary) hypertension: Secondary | ICD-10-CM

## 2022-12-17 DIAGNOSIS — H903 Sensorineural hearing loss, bilateral: Secondary | ICD-10-CM | POA: Diagnosis not present

## 2022-12-23 ENCOUNTER — Other Ambulatory Visit: Payer: Self-pay | Admitting: Family Medicine

## 2022-12-29 DIAGNOSIS — M5451 Vertebrogenic low back pain: Secondary | ICD-10-CM | POA: Diagnosis not present

## 2023-02-15 ENCOUNTER — Other Ambulatory Visit: Payer: Self-pay | Admitting: Family Medicine

## 2023-02-15 ENCOUNTER — Telehealth: Payer: Self-pay

## 2023-02-15 DIAGNOSIS — I10 Essential (primary) hypertension: Secondary | ICD-10-CM

## 2023-02-15 MED ORDER — VERAPAMIL HCL ER 120 MG PO TBCR: 120.0000 mg | EXTENDED_RELEASE_TABLET | Freq: Two times a day (BID) | ORAL | 1 refills | Status: DC

## 2023-02-15 NOTE — Telephone Encounter (Signed)
Patient called stating that he went to pick up his refill from his pharmacy for the Verapamil, and that the directions on the medication had changed and he has not been aware of any changes. He states that he is still taking the medication twice daily. I asked if his blood pressures were doing fine and he said yes been great. Can you fix this and re-send to patient's pharmacy?

## 2023-02-16 NOTE — Telephone Encounter (Signed)
Called and cancelled refills for Verapamil 120 x1 daily. Called patient and informed.

## 2023-03-29 ENCOUNTER — Other Ambulatory Visit: Payer: Self-pay | Admitting: Family Medicine

## 2023-03-29 DIAGNOSIS — I10 Essential (primary) hypertension: Secondary | ICD-10-CM

## 2023-03-30 ENCOUNTER — Other Ambulatory Visit: Payer: Self-pay

## 2023-03-30 DIAGNOSIS — I10 Essential (primary) hypertension: Secondary | ICD-10-CM

## 2023-03-30 MED ORDER — VERAPAMIL HCL ER 120 MG PO TBCR: 120.0000 mg | EXTENDED_RELEASE_TABLET | Freq: Two times a day (BID) | ORAL | 1 refills | Status: DC

## 2023-03-30 NOTE — Telephone Encounter (Signed)
Called and spoke with pharmacy and they corrected it in their system but states that insurance wont pay for the new script to filled until 04/16/23 because they state that its too early even though directions have been changed. Per Dr. Sedalia Muta change patient to Verapamil 240 mg 1 tablet once daily which is equal to the dose that he was taking which was verapamil 120 mg twice daily. Called in to the pharmacy to let them know and they filled the 240 mg 1 tablet daily and patient was informed too as well.

## 2023-03-30 NOTE — Telephone Encounter (Signed)
Patient's Verapamil is supposed to be twice daily and not once daily. Called pharmacy to correct this and for them to cancel the old prescription that said once daily and to fill the new prescription for the twice daily.

## 2023-04-12 ENCOUNTER — Other Ambulatory Visit: Payer: Self-pay | Admitting: Family Medicine

## 2023-04-12 DIAGNOSIS — I10 Essential (primary) hypertension: Secondary | ICD-10-CM

## 2023-05-17 ENCOUNTER — Telehealth: Payer: Self-pay

## 2023-05-17 NOTE — Telephone Encounter (Signed)
I called the patient today to see about getting an appointment scheduled for a chronic f.up fasting. Looks like this patient is currently overdue for an appointment. I left a message for the patient to call the office back.   Also, please schedule him for a AWV with Selena Batten.

## 2023-05-18 NOTE — Telephone Encounter (Signed)
Appointment has been scheduled.

## 2023-06-02 ENCOUNTER — Ambulatory Visit (INDEPENDENT_AMBULATORY_CARE_PROVIDER_SITE_OTHER): Payer: PPO

## 2023-06-02 VITALS — BP 138/70 | HR 61 | Resp 16

## 2023-06-02 DIAGNOSIS — Z0000 Encounter for general adult medical examination without abnormal findings: Secondary | ICD-10-CM | POA: Diagnosis not present

## 2023-06-02 NOTE — Patient Instructions (Signed)
Mr. Jesse [name] , Thank you for taking time to come for your Medicare Wellness Visit. I appreciate your ongoing commitment to your health goals. Please review the following plan we discussed and let me know if I can assist you in the future.     Goals      Prevent falls     06/02/2023 AWV Goal: Fall Prevention  Over the next year, patient will decrease their risk for falls by: Using assistive devices, such as a cane or walker, as needed Identifying fall risks within their home and correcting them by: Removing throw rugs Adding handrails to stairs or ramps Removing clutter and keeping a clear pathway throughout the home Increasing light, especially at night Adding shower handles/bars Raising toilet seat Identifying potential personal risk factors for falls: Medication side effects Incontinence/urgency Vestibular dysfunction Hearing loss Musculoskeletal disorders Neurological disorders Orthostatic hypotension          This is a list of the screening recommended for you and due dates:  Health Maintenance  Topic Date Due   Zoster (Shingles) Vaccine (1 of 2) Never done   COVID-19 Vaccine (5 - 2023-24 season) 08/07/2022   Flu Shot  07/08/2023   DTaP/Tdap/Td vaccine (2 - Td or Tdap) 07/26/2023   Medicare Annual Wellness Visit  06/01/2024   Pneumonia Vaccine  Completed   HPV Vaccine  Aged Out   You can get your Shingles vaccine and Tetanus booster at the pharmacy.  Advanced directives: Please bring a copy for your medical record   Preventive Care 65 Years and Older, Male  Preventive care refers to lifestyle choices and visits with your health care provider that can promote health and wellness. What does preventive care include? A yearly physical exam. This is also called an annual well check. Dental exams once or twice a year. Routine eye exams. Ask your health care provider how often you should have your eyes checked. Personal lifestyle choices, including: Daily care of your  teeth and gums. Regular physical activity. Eating a healthy diet. Avoiding tobacco and drug use. Limiting alcohol use. Practicing safe sex. Taking low doses of aspirin every day. Taking vitamin and mineral supplements as recommended by your health care provider. What happens during an annual well check? The services and screenings done by your health care provider during your annual well check will depend on your age, overall health, lifestyle risk factors, and family history of disease. Counseling  Your health care provider may ask you questions about your: Alcohol use. Tobacco use. Drug use. Emotional well-being. Home and relationship well-being. Sexual activity. Eating habits. History of falls. Memory and ability to understand (cognition). Work and work Astronomer. Screening  You may have the following tests or measurements: Height, weight, and BMI. Blood pressure. Lipid and cholesterol levels. These may be checked every 5 years, or more frequently if you are over 73 years old. Skin check. Lung cancer screening. You may have this screening every year starting at age 48 if you have a 30-pack-year history of smoking and currently smoke or have quit within the past 15 years. Fecal occult blood test (FOBT) of the stool. You may have this test every year starting at age 36. Flexible sigmoidoscopy or colonoscopy. You may have a sigmoidoscopy every 5 years or a colonoscopy every 10 years starting at age 80. Prostate cancer screening. Recommendations will vary depending on your family history and other risks. Hepatitis C blood test. Hepatitis B blood test. Sexually transmitted disease (STD) testing. Diabetes screening. This is done  by checking your blood sugar (glucose) after you have not eaten for a while (fasting). You may have this done every 1-3 years. Abdominal aortic aneurysm (AAA) screening. You may need this if you are a current or former smoker. Osteoporosis. You may be  screened starting at age 72 if you are at high risk. Talk with your health care provider about your test results, treatment options, and if necessary, the need for more tests. Vaccines  Your health care provider may recommend certain vaccines, such as: Influenza vaccine. This is recommended every year. Tetanus, diphtheria, and acellular pertussis (Tdap, Td) vaccine. You may need a Td booster every 10 years. Zoster vaccine. You may need this after age 29. Pneumococcal 13-valent conjugate (PCV13) vaccine. One dose is recommended after age 36. Pneumococcal polysaccharide (PPSV23) vaccine. One dose is recommended after age 81. Talk to your health care provider about which screenings and vaccines you need and how often you need them. This information is not intended to replace advice given to you by your health care provider. Make sure you discuss any questions you have with your health care provider. Document Released: 12/20/2015 Document Revised: 08/12/2016 Document Reviewed: 09/24/2015 Elsevier Interactive Patient Education  2017 [employer].  Fall Prevention in the Home Falls can cause injuries. They can happen to people of all ages. There are many things you can do to make your home safe and to help prevent falls. What can I do on the outside of my home? Regularly fix the edges of walkways and driveways and fix any cracks. Remove anything that might make you trip as you walk through a door, such as a raised step or threshold. Trim any bushes or trees on the path to your home. Use bright outdoor lighting. Clear any walking paths of anything that might make someone trip, such as rocks or tools. Regularly check to see if handrails are loose or broken. Make sure that both sides of any steps have handrails. Any raised decks and porches should have guardrails on the edges. Have any leaves, snow, or ice cleared regularly. Use sand or salt on walking paths during winter. Clean up any spills in  your garage right away. This includes oil or grease spills. What can I do in the bathroom? Use night lights. Install grab bars by the toilet and in the tub and shower. Do not use towel bars as grab bars. Use non-skid mats or decals in the tub or shower. If you need to sit down in the shower, use a plastic, non-slip stool. Keep the floor dry. Clean up any water that spills on the floor as soon as it happens. Remove soap buildup in the tub or shower regularly. Attach bath mats securely with double-sided non-slip rug tape. Do not have throw rugs and other things on the floor that can make you trip. What can I do in the bedroom? Use night lights. Make sure that you have a light by your bed that is easy to reach. Do not use any sheets or blankets that are too big for your bed. They should not hang down onto the floor. Have a firm chair that has side arms. You can use this for support while you get dressed. Do not have throw rugs and other things on the floor that can make you trip. What can I do in the kitchen? Clean up any spills right away. Avoid walking on wet floors. Keep items that you use a lot in easy-to-reach places. If you need to  reach something above you, use a strong step stool that has a grab bar. Keep electrical cords out of the way. Do not use floor polish or wax that makes floors slippery. If you must use wax, use non-skid floor wax. Do not have throw rugs and other things on the floor that can make you trip. What can I do with my stairs? Do not leave any items on the stairs. Make sure that there are handrails on both sides of the stairs and use them. Fix handrails that are broken or loose. Make sure that handrails are as long as the stairways. Check any carpeting to make sure that it is firmly attached to the stairs. Fix any carpet that is loose or worn. Avoid having throw rugs at the top or bottom of the stairs. If you do have throw rugs, attach them to the floor with carpet  tape. Make sure that you have a light switch at the top of the stairs and the bottom of the stairs. If you do not have them, ask someone to add them for you. What else can I do to help prevent falls? Wear shoes that: Do not have high heels. Have rubber bottoms. Are comfortable and fit you well. Are closed at the toe. Do not wear sandals. If you use a stepladder: Make sure that it is fully opened. Do not climb a closed stepladder. Make sure that both sides of the stepladder are locked into place. Ask someone to hold it for you, if possible. Clearly mark and make sure that you can see: Any grab bars or handrails. First and last steps. Where the edge of each step is. Use tools that help you move around (mobility aids) if they are needed. These include: Canes. Walkers. Scooters. Crutches. Turn on the lights when you go into a dark area. Replace any light bulbs as soon as they burn out. Set up your furniture so you have a clear path. Avoid moving your furniture around. If any of your floors are uneven, fix them. If there are any pets around you, be aware of where they are. Review your medicines with your doctor. Some medicines can make you feel dizzy. This can increase your chance of falling. Ask your doctor what other things that you can do to help prevent falls. This information is not intended to replace advice given to you by your health care provider. Make sure you discuss any questions you have with your health care provider. Document Released: 09/19/2009 Document Revised: 04/30/2016 Document Reviewed: 12/28/2014 Elsevier Interactive Patient Education  2017 [employer].

## 2023-06-02 NOTE — Progress Notes (Signed)
Subjective:   Jesse [name]. is a 82 y.o. male who presents for Medicare Annual/Subsequent preventive examination.  Visit Complete: In person  Patient Medicare AWV questionnaire was completed by the patient on 05/31/23; I have confirmed that all information answered by patient is correct and no changes since this date.  Cardiac Risk Factors include: advanced age (>14men, >40 women);male gender     Objective:    Today's Vitals   06/02/23 1408  BP: 138/70  Pulse: 61  Resp: 16  SpO2: 97%  PainSc: 0-No pain   There is no height or weight on file to calculate BMI.     07/28/2022    1:08 PM 09/02/2020    7:05 PM 09/02/2020    6:14 PM 06/19/2020    2:45 PM 07/13/2018    7:15 PM 07/06/2018   10:07 AM  Advanced Directives  Does Patient Have a Medical Advance Directive? Yes Yes Unable to assess, patient is non-responsive or altered mental status Yes Yes Yes  Type of Advance Directive Living will;Healthcare Power of [employer] Power of [place];Living will  Healthcare Power of [place];Living will Living will Living will  Does patient want to make changes to medical advance directive?  No - Patient declined  No - Patient declined No - Patient declined No - Patient declined  Copy of Healthcare Power of Attorney in Chart?  No - copy requested  No - copy requested      Current Medications (verified) Outpatient Encounter Medications as of 06/02/2023  Medication Sig   fexofenadine (ALLEGRA) 180 MG tablet Take 180 mg by mouth daily.   fluticasone (FLONASE) 50 MCG/ACT nasal spray Place 2 sprays into both nostrils daily as needed for allergies.   KLOR-CON M20 20 MEQ tablet TAKE 1 TABLET BY MOUTH EVERY DAY   levofloxacin (LEVAQUIN) 750 MG tablet Take 1 tablet (750 mg total) by mouth daily.   Multiple Vitamins-Minerals (PRESERVISION AREDS 2 PO) Take 1 capsule by mouth in the morning and at bedtime.   nystatin-triamcinolone (MYCOLOG II) cream Apply 1 Application topically 2 (two)  times daily.   omeprazole (PRILOSEC) 20 MG capsule Take 20 mg by mouth daily before breakfast.    ondansetron (ZOFRAN) 4 MG tablet Take 1 tablet (4 mg total) by mouth every 8 (eight) hours as needed for nausea or vomiting.   rosuvastatin (CRESTOR) 10 MG tablet TAKE 1 TABLET BY MOUTH EVERY DAY   valsartan-hydrochlorothiazide (DIOVAN-HCT) 320-25 MG tablet TAKE 1 TABLET BY MOUTH EVERY DAY   verapamil (CALAN-SR) 240 MG CR tablet Take 240 mg by mouth daily.   HYDROcodone-acetaminophen (NORCO/VICODIN) 5-325 MG tablet Take 1 tablet by mouth every 6 (six) hours as needed for severe pain (OSteoarthritis of lumbar spine.). (Patient not taking: Reported on 06/02/2023)   [DISCONTINUED] predniSONE (DELTASONE) 50 MG tablet Take 1 tablet (50 mg total) by mouth daily with breakfast.   No facility-administered encounter medications on file as of 06/02/2023.    Allergies (verified) Penicillins   History: Past Medical History:  Diagnosis Date   Acute pulmonary embolism without acute cor pulmonale (HCC) 09/02/2020   Arthritis    Degenerative lumbar spinal stenosis    Fatty liver    GERD (gastroesophageal reflux disease)    Headache    History of kidney stones    Hyperlipidemia    Hypertension    Mastodynia    Migraine without aura and without status migrainosus, not intractable    Neuritis or radiculitis due to rupture of lumbar intervertebral disc 07/15/2018  PE (pulmonary thromboembolism) (HCC)    Pre-diabetes    Radiculopathy of thoracolumbar region 07/15/2018   Radiculopathy of thoracolumbar region 07/15/2018   Past Surgical History:  Procedure Laterality Date   ANTERIOR LATERAL LUMBAR FUSION WITH PERCUTANEOUS SCREW 2 LEVEL N/A 07/13/2018   Procedure: ANTERIOR LATERAL LUMBAR FUSION L2-4;  Surgeon: Venita Lick, MD;  Location: [facility];  Service: Orthopedics;  Laterality: N/A;  4 hrs   CHOLECYSTECTOMY  1999   GALLBLADDER SURGERY  2016   NECK SURGERY  1995   Ruptured disc repair   REVERSE  SHOULDER ARTHROPLASTY Left 08/06/2022   Procedure: REVERSE SHOULDER ARTHROPLASTY;  Surgeon: Francena Hanly, MD;  Location: [facility];  Service: Orthopedics;  Laterality: Left;    SHOULDER SURGERY Left 2005   SHOULDER SURGERY Right 2016   THROAT SURGERY     stretching   Family History  Problem Relation Age of Onset   Hypertension Mother    Cancer Father    Heart disease Sister    Diabetes Brother    Migraines Neg Hx    Social History   Socioeconomic History   Marital status: Married    Spouse name: Darel Hong   Number of children: 4   Years of education: 12+   Highest education level: Not on file  Occupational History   Occupation: Retired Visual merchandiser  Tobacco Use   Smoking status: Former    Years: 15    Types: Cigarettes    Quit date: 1980    Years since quitting: 44.5   Smokeless tobacco: Never   Tobacco comments:    quit at least 43 years ago   Vaping Use   Vaping Use: Never used  Substance and Sexual Activity   Alcohol use: No   Drug use: No   Sexual activity: Not Currently  Other Topics Concern   Not on file  Social History Narrative   Lives with wife   Caffeine use: Drinks decaf coffee and tea   Writes right-handed, eats and shaves left-handed   Social Determinants of Health   Financial Resource Strain: Low Risk  (05/31/2023)   Overall Financial Resource Strain (CARDIA)    Difficulty of Paying Living Expenses: Not hard at all  Food Insecurity: No Food Insecurity (05/31/2023)   Hunger Vital Sign    Worried About Running Out of Food in the Last Year: Never true    Ran Out of Food in the Last Year: Never true  Transportation Needs: Unmet Transportation Needs (05/31/2023)   PRAPARE - Transportation    Lack of Transportation (Medical): No    Lack of Transportation (Non-Medical): Yes  Physical Activity: Insufficiently Active (05/31/2023)   Exercise Vital Sign    Days of Exercise per Week: 5 days    Minutes of Exercise per Session: 20 min  Stress: No Stress Concern  Present (05/31/2023)   [employer] of Occupational Health - Occupational Stress Questionnaire    Feeling of Stress : Not at all  Social Connections: Socially Integrated (05/31/2023)   Social Connection and Isolation Panel [NHANES]    Frequency of Communication with Friends and Family: Three times a week    Frequency of Social Gatherings with Friends and Family: Twice a week    Attends Religious Services: More than 4 times per year    Active Member of [employer] or Organizations: Yes    Attends Banker Meetings: 1 to 4 times per year    Marital Status: Married    Tobacco Counseling Counseling given: Not Answered Tobacco  comments: quit at least 43 years ago    Clinical Intake:  Pre-visit preparation completed: Yes  Pain : No/denies pain Pain Score: 0-No pain     BMI - recorded: 32.57 Nutritional Status: BMI > 30  Obese Nutritional Risks: None Diabetes: No  How often do you need to have someone help you when you read instructions, pamphlets, or other written materials from your doctor or pharmacy?: 1 - Never  Interpreter Needed?: No      Activities of Daily Living    05/31/2023   12:49 PM 07/28/2022    1:11 PM  In your present state of health, do you have any difficulty performing the following activities:  Hearing? 1   Comment corrected with hearing aids   Vision? 0   Difficulty concentrating or making decisions? 0   Walking or climbing stairs? 0   Dressing or bathing? 0   Doing errands, shopping? 0 0  Preparing Food and eating ? N   Using the Toilet? N   In the past six months, have you accidently leaked urine? Y   Do you have problems with loss of bowel control? N   Managing your Medications? N   Managing your Finances? N   Housekeeping or managing your Housekeeping? N     Patient Care Team: Blane Ohara, MD as PCP - General (Family Medicine) Misenheimer, Marcial Pacas, MD as Consulting Physician (Unknown Physician Specialty) Francena Hanly, MD as  Consulting Physician (Orthopedic Surgery) [facility], [facility] (Ophthalmology)  Indicate any recent Medical Services you may have received from other than [facility] providers in the past year (date may be approximate).     Assessment:   This is a routine wellness examination for Bohdi.  Hearing/Vision screen No results found.  Dietary issues and exercise activities discussed:     Goals Addressed             This Visit's Progress    Prevent falls       06/02/2023 AWV Goal: Fall Prevention  Over the next year, patient will decrease their risk for falls by: Using assistive devices, such as a cane or walker, as needed Identifying fall risks within their home and correcting them by: Removing throw rugs Adding handrails to stairs or ramps Removing clutter and keeping a clear pathway throughout the home Increasing light, especially at night Adding shower handles/bars Raising toilet seat Identifying potential personal risk factors for falls: Medication side effects Incontinence/urgency Vestibular dysfunction Hearing loss Musculoskeletal disorders Neurological disorders Orthostatic hypotension         Depression Screen    06/02/2023    2:03 PM 05/06/2022    9:15 AM 01/06/2022    8:09 AM 11/04/2021    3:48 PM 04/01/2021    8:42 AM 01/02/2021    9:31 AM 06/19/2020    2:46 PM  PHQ 2/9 Scores  PHQ - 2 Score 0 0 0 0 0 0 0    Fall Risk    05/31/2023   12:49 PM 09/24/2022    8:10 AM 06/17/2022   10:25 AM 05/06/2022    9:17 AM 01/06/2022    8:09 AM  Fall Risk   Falls in the past year? 0 1 1 0 0  Number falls in past yr: 0 0 0 0 0  Injury with Fall? 0 0 1 0 0  Risk for fall due to : No Fall Risks No Fall Risks No Fall Risks No Fall Risks   Follow up Falls evaluation completed Falls evaluation completed Falls  evaluation completed Falls evaluation completed;Education provided Falls evaluation completed    MEDICARE RISK AT HOME:   TIMED UP AND GO:  Was the test  performed?  Yes  Length of time to ambulate 10 feet: 6 sec Gait steady and fast without use of assistive device    Cognitive Function:        06/02/2023    2:22 PM 05/06/2022    9:19 AM 06/19/2020    2:48 PM  6CIT Screen  What Year? 0 points 0 points 0 points  What month? 0 points 0 points 0 points  What time? 0 points 0 points 0 points  Count back from 20 0 points 0 points 0 points  Months in reverse 0 points 0 points 2 points  Repeat phrase 0 points 0 points 0 points  Total Score 0 points 0 points 2 points    Immunizations Immunization History  Administered Date(s) Administered   Fluad Quad(high Dose 65+) 10/01/2020, 09/09/2021, 09/24/2022   Influenza, High Dose Seasonal PF 10/20/2018, 10/06/2019   Influenza-Unspecified 10/26/2018, 09/06/2021   PFIZER Comirnaty(Gray Top)Covid-19 Tri-Sucrose Vaccine 04/01/2021   PFIZER(Purple Top)SARS-COV-2 Vaccination 12/22/2019, 01/15/2020   Pfizer Covid-19 Vaccine Bivalent Booster 21yrs & up 09/09/2021   Pneumococcal Conjugate-13 09/23/2015   Pneumococcal Polysaccharide-23 06/17/2009   Tdap 07/25/2013    TDAP status: Up to date  Flu Vaccine status: Up to date  Pneumococcal vaccine status: Up to date  Covid-19 vaccine status: Declined, Education has been provided regarding the importance of this vaccine but patient still declined. Advised may receive this vaccine at local pharmacy or Health Dept.or vaccine clinic. Aware to provide a copy of the vaccination record if obtained from local pharmacy or Health Dept. Verbalized acceptance and understanding.  Qualifies for Shingles Vaccine? Yes   Zostavax completed No   Shingrix Completed?: No.    Education has been provided regarding the importance of this vaccine. Patient has been advised to call insurance company to determine out of pocket expense if they have not yet received this vaccine. Advised may also receive vaccine at local pharmacy or Health Dept. Verbalized acceptance and  understanding.  Screening Tests Health Maintenance  Topic Date Due   Zoster Vaccines- Shingrix (1 of 2) Never done   COVID-19 Vaccine (5 - 2023-24 season) 08/07/2022   Medicare Annual Wellness (AWV)  05/07/2023   INFLUENZA VACCINE  07/08/2023   DTaP/Tdap/Td (2 - Td or Tdap) 07/26/2023   Pneumonia Vaccine 50+ Years old  Completed   HPV VACCINES  Aged Out    Health Maintenance  Health Maintenance Due  Topic Date Due   Zoster Vaccines- Shingrix (1 of 2) Never done   COVID-19 Vaccine (5 - 2023-24 season) 08/07/2022   Medicare Annual Wellness (AWV)  05/07/2023    Colorectal cancer screening: No longer required.   Lung Cancer Screening: (Low Dose CT Chest recommended if Age 58-80 years, 20 pack-year currently smoking OR have quit w/in 15years.) does not qualify.   Lung Cancer Screening Referral: N/A  Additional Screening:  Vision Screening: Recommended annual ophthalmology exams for early detection of glaucoma and other disorders of the eye. Is the patient up to date with their annual eye exam?  Yes  Who is the provider or what is the name of the office in which the patient attends annual eye exams? [facility]  Dental Screening: Recommended annual dental exams for proper oral hygiene   Community Resource Referral / Chronic Care Management: CRR required this visit?  No   CCM required this  visit?  No     Plan:    1- Tetanus vaccine due in August  I have personally reviewed and noted the following in the patient's chart:   Medical and social history Use of alcohol, tobacco or illicit drugs  Current medications and supplements including opioid prescriptions. Patient is not currently taking opioid prescriptions. Functional ability and status Nutritional status Physical activity Advanced directives List of other physicians Hospitalizations, surgeries, and ER visits in previous 12 months Vitals Screenings to include cognitive, depression, and falls Referrals and  appointments  In addition, I have reviewed and discussed with patient certain preventive protocols, quality metrics, and best practice recommendations. A written personalized care plan for preventive services as well as general preventive health recommendations were provided to patient.     Jacklynn Bue, LPN   05/13/3015   After Visit Summary: available to view on MyChart

## 2023-06-12 ENCOUNTER — Other Ambulatory Visit: Payer: Self-pay | Admitting: Family Medicine

## 2023-06-18 ENCOUNTER — Other Ambulatory Visit: Payer: Self-pay | Admitting: Family Medicine

## 2023-07-05 ENCOUNTER — Ambulatory Visit: Payer: PPO | Attending: Cardiology

## 2023-07-05 DIAGNOSIS — I350 Nonrheumatic aortic (valve) stenosis: Secondary | ICD-10-CM

## 2023-07-05 LAB — ECHOCARDIOGRAM COMPLETE
AR max vel: 0.98 cm2
AV Area VTI: 1.04 cm2
AV Area mean vel: 0.97 cm2
AV Mean grad: 11.8 mmHg
AV Peak grad: 21.9 mmHg
Ao pk vel: 2.34 m/s
S' Lateral: 3.1 cm

## 2023-07-06 DIAGNOSIS — L570 Actinic keratosis: Secondary | ICD-10-CM | POA: Diagnosis not present

## 2023-07-06 DIAGNOSIS — D485 Neoplasm of uncertain behavior of skin: Secondary | ICD-10-CM | POA: Diagnosis not present

## 2023-07-15 ENCOUNTER — Other Ambulatory Visit: Payer: Self-pay

## 2023-07-15 DIAGNOSIS — E782 Mixed hyperlipidemia: Secondary | ICD-10-CM

## 2023-07-15 DIAGNOSIS — I10 Essential (primary) hypertension: Secondary | ICD-10-CM

## 2023-07-15 DIAGNOSIS — R7303 Prediabetes: Secondary | ICD-10-CM

## 2023-07-16 ENCOUNTER — Other Ambulatory Visit: Payer: PPO

## 2023-07-16 DIAGNOSIS — I10 Essential (primary) hypertension: Secondary | ICD-10-CM | POA: Diagnosis not present

## 2023-07-16 DIAGNOSIS — E782 Mixed hyperlipidemia: Secondary | ICD-10-CM | POA: Diagnosis not present

## 2023-07-16 DIAGNOSIS — R7303 Prediabetes: Secondary | ICD-10-CM

## 2023-07-19 NOTE — Progress Notes (Signed)
Subjective:  Patient ID: Jesse Heading., male    DOB: Mar 20, 1941  Age: 82 y.o. MRN: [id number]  Chief Complaint  Patient presents with   Medical Management of Chronic Issues    HPI Prediabetes: Patient is not taking any medication, but he is eating healthy and low carbs diet. Exercising.    Hypertension: He is taking Valsartan/HYDROCHLOROTHIAZIDE 320/25 mg daily and verapamil 240 mg at bedtime.    GERD: He takes Omeprazole 20 mg daily. Patient saw Dr. Jennye Boroughs and did EGD with stricture and dilitation..   Hyperlipidemia:  Rosuvastatin 10 mg daily.  Healthy diet and exercise.    Allergic rhinitis: on allegra daily. Simple saline.        07/20/2023   11:30 AM 06/02/2023    2:03 PM 05/06/2022    9:15 AM 01/06/2022    8:09 AM 11/04/2021    3:48 PM  Depression screen PHQ 2/9  Decreased Interest 0 0 0 0 0  Down, Depressed, Hopeless 0 0 0 0 0  PHQ - 2 Score 0 0 0 0 0  Altered sleeping 0      Tired, decreased energy 0      Change in appetite 0      Feeling bad or failure about yourself  0      Trouble concentrating 0      Moving slowly or fidgety/restless 0      Suicidal thoughts 0      PHQ-9 Score 0      Difficult doing work/chores Not difficult at all            07/20/2023   11:30 AM  Fall Risk   Falls in the past year? 0  Number falls in past yr: 0  Injury with Fall? 0  Risk for fall due to : No Fall Risks  Follow up Falls evaluation completed;Follow up appointment    Patient Care Team: Blane Ohara, MD as PCP - General (Family Medicine) Misenheimer, Marcial Pacas, MD as Consulting Physician (Unknown Physician Specialty) Francena Hanly, MD as Consulting Physician (Orthopedic Surgery) [facility], [facility] (Ophthalmology)   Review of Systems  Constitutional:  Negative for chills, diaphoresis, fatigue and fever.  HENT:  Negative for congestion, ear pain and sore throat.   Respiratory:  Negative for cough and shortness of breath.   Cardiovascular:  Negative for  chest pain and leg swelling.  Gastrointestinal:  Negative for abdominal pain, constipation, diarrhea, nausea and vomiting.  Genitourinary:  Negative for dysuria and urgency.  Musculoskeletal:  Positive for arthralgias, back pain and myalgias.  Neurological:  Negative for dizziness and headaches.  Psychiatric/Behavioral:  Negative for dysphoric mood.     Current Outpatient Medications on File Prior to Visit  Medication Sig Dispense Refill   fexofenadine (ALLEGRA) 180 MG tablet Take 180 mg by mouth daily.     fluticasone (FLONASE) 50 MCG/ACT nasal spray Place 2 sprays into both nostrils daily as needed for allergies.     KLOR-CON M20 20 MEQ tablet TAKE 1 TABLET BY MOUTH EVERY DAY 90 tablet 1   Multiple Vitamins-Minerals (PRESERVISION AREDS 2 PO) Take 1 capsule by mouth in the morning and at bedtime.     nystatin-triamcinolone (MYCOLOG II) cream Apply 1 Application topically 2 (two) times daily. 30 g 1   omeprazole (PRILOSEC) 20 MG capsule Take 20 mg by mouth daily before breakfast.      rosuvastatin (CRESTOR) 10 MG tablet TAKE 1 TABLET BY MOUTH EVERY DAY 90 tablet 1   verapamil (CALAN-SR)  240 MG CR tablet Take 240 mg by mouth daily.     No current facility-administered medications on file prior to visit.   Past Medical History:  Diagnosis Date   Acute pulmonary embolism without acute cor pulmonale (HCC) 09/02/2020   Arthritis    Degenerative lumbar spinal stenosis    Fatty liver    GERD (gastroesophageal reflux disease)    Headache    History of kidney stones    Hyperlipidemia    Hypertension    Mastodynia    Migraine without aura and without status migrainosus, not intractable    Neuritis or radiculitis due to rupture of lumbar intervertebral disc 07/15/2018   PE (pulmonary thromboembolism) (HCC)    Pre-diabetes    Radiculopathy of thoracolumbar region 07/15/2018   Radiculopathy of thoracolumbar region 07/15/2018   Past Surgical History:  Procedure Laterality Date   ANTERIOR  LATERAL LUMBAR FUSION WITH PERCUTANEOUS SCREW 2 LEVEL N/A 07/13/2018   Procedure: ANTERIOR LATERAL LUMBAR FUSION L2-4;  Surgeon: Venita Lick, MD;  Location: [facility] OR;  Service: Orthopedics;  Laterality: N/A;  4 hrs   CHOLECYSTECTOMY  1999   GALLBLADDER SURGERY  2016   NECK SURGERY  1995   Ruptured disc repair   REVERSE SHOULDER ARTHROPLASTY Left 08/06/2022   Procedure: REVERSE SHOULDER ARTHROPLASTY;  Surgeon: Francena Hanly, MD;  Location: [facility];  Service: Orthopedics;  Laterality: Left;    SHOULDER SURGERY Left 2005   SHOULDER SURGERY Right 2016   THROAT SURGERY     stretching    Family History  Problem Relation Age of Onset   Hypertension Mother    Cancer Father    Heart disease Sister    Diabetes Brother    Migraines Neg Hx    Social History   Socioeconomic History   Marital status: Married    Spouse name: Darel Hong   Number of children: 4   Years of education: 12+   Highest education level: Associate degree: occupational, Scientist, product/process development, or vocational program  Occupational History   Occupation: Retired Visual merchandiser  Tobacco Use   Smoking status: Former    Current packs/day: 0.00    Types: Cigarettes    Start date: 1965    Quit date: 1980    Years since quitting: 44.6   Smokeless tobacco: Never   Tobacco comments:    quit at least 43 years ago   Vaping Use   Vaping status: Never Used  Substance and Sexual Activity   Alcohol use: No   Drug use: No   Sexual activity: Not Currently  Other Topics Concern   Not on file  Social History Narrative   Lives with wife   Caffeine use: Drinks decaf coffee and tea   Writes right-handed, eats and shaves left-handed   Social Determinants of Health   Financial Resource Strain: Low Risk  (07/18/2023)   Overall Financial Resource Strain (CARDIA)    Difficulty of Paying Living Expenses: Not hard at all  Food Insecurity: No Food Insecurity (07/18/2023)   Hunger Vital Sign    Worried About Running Out of Food in the Last Year: Never true     Ran Out of Food in the Last Year: Never true  Transportation Needs: No Transportation Needs (07/18/2023)   PRAPARE - Administrator, Civil Service (Medical): No    Lack of Transportation (Non-Medical): No  Recent Concern: Transportation Needs - Unmet Transportation Needs (05/31/2023)   PRAPARE - Administrator, Civil Service (Medical): No  Lack of Transportation (Non-Medical): Yes  Physical Activity: Insufficiently Active (07/18/2023)   Exercise Vital Sign    Days of Exercise per Week: 4 days    Minutes of Exercise per Session: 30 min  Stress: No Stress Concern Present (07/18/2023)   [employer] of Occupational Health - Occupational Stress Questionnaire    Feeling of Stress : Only a little  Social Connections: Socially Integrated (07/18/2023)   Social Connection and Isolation Panel [NHANES]    Frequency of Communication with Friends and Family: More than three times a week    Frequency of Social Gatherings with Friends and Family: Twice a week    Attends Religious Services: More than 4 times per year    Active Member of [employer] or Organizations: Yes    Attends Banker Meetings: 1 to 4 times per year    Marital Status: Married    Objective:  BP 110/64   Pulse 60   Temp (!) 96.7 F (35.9 C)   Ht 5\' 11"  (1.803 m)   Wt 230 lb 3.2 oz (104.4 kg)   BMI 32.11 kg/m      07/23/2023    8:29 AM 07/20/2023   11:24 AM 06/02/2023    2:08 PM  BP/Weight  Systolic BP 122 110 138  Diastolic BP 62 64 70  Wt. (Lbs) 231 230.2   BMI 32.22 kg/m2 32.11 kg/m2     Physical Exam Vitals reviewed.  Constitutional:      Appearance: Normal appearance. He is normal weight.  Cardiovascular:     Rate and Rhythm: Normal rate and regular rhythm.     Heart sounds: No murmur heard. Pulmonary:     Effort: Pulmonary effort is normal.     Breath sounds: Normal breath sounds.  Abdominal:     General: Abdomen is flat. Bowel sounds are normal.     Palpations:  Abdomen is soft.     Tenderness: There is no abdominal tenderness.  Neurological:     Mental Status: He is alert and oriented to person, place, and time.  Psychiatric:        Mood and Affect: Mood normal.        Behavior: Behavior normal.     Diabetic Foot Exam - Simple   No data filed      Lab Results  Component Value Date   WBC 4.6 07/16/2023   HGB 13.5 07/16/2023   HCT 40.6 07/16/2023   PLT 170 07/16/2023   GLUCOSE 109 (H) 07/16/2023   CHOL 86 (L) 07/16/2023   TRIG 105 07/16/2023   HDL 36 (L) 07/16/2023   LDLCALC 30 07/16/2023   ALT 48 (H) 07/16/2023   AST 34 07/16/2023   NA 142 07/16/2023   K 3.7 07/16/2023   CL 101 07/16/2023   CREATININE 1.08 07/16/2023   BUN 14 07/16/2023   CO2 25 07/16/2023   INR 1.6 (H) 09/02/2020   HGBA1C 6.1 (H) 07/16/2023      Assessment & Plan:    Prediabetes Assessment & Plan: A1c worsened slightly.  Continue healthy diet and exercise.   Mixed hyperlipidemia Assessment & Plan: Well-controlled.   Continue rosuvastatin 10 mg daily. Continue healthy diet and exercise.     Essential hypertension Assessment & Plan: Well controlled.  No changes to medicines.  Continue Valsartan/HCT 320/25 daily. Verapamil 240 mg daily.   Continue to work on eating a healthy diet and exercise.  Labs reviewed  Orders: -     Valsartan-hydroCHLOROthiazide; Take 1 tablet by mouth  daily.  Dispense: 90 tablet; Refill: 1  Gastroesophageal reflux disease without esophagitis Assessment & Plan: At goal.  Continue omeprazole 20 mg daily.    Other specified hearing loss of both ears Assessment & Plan: Wears hearing aides.        Meds ordered this encounter  Medications   valsartan-hydrochlorothiazide (DIOVAN-HCT) 320-25 MG tablet    Sig: Take 1 tablet by mouth daily.    Dispense:  90 tablet    Refill:  1    No orders of the defined types were placed in this encounter.    Follow-up: Return in about 6 months (around  01/20/2024).   I,Katherina A Bramblett,acting as a scribe for Blane Ohara, MD.,have documented all relevant documentation on the behalf of Blane Ohara, MD,as directed by  Blane Ohara, MD while in the presence of Blane Ohara, MD.   An After Visit Summary was printed and given to the patient.  Blane Ohara, MD [name] [phone]

## 2023-07-20 ENCOUNTER — Ambulatory Visit: Payer: PPO | Admitting: Family Medicine

## 2023-07-20 VITALS — BP 110/64 | HR 60 | Temp 96.7°F | Ht 71.0 in | Wt 230.2 lb

## 2023-07-20 DIAGNOSIS — R7303 Prediabetes: Secondary | ICD-10-CM

## 2023-07-20 DIAGNOSIS — H918X3 Other specified hearing loss, bilateral: Secondary | ICD-10-CM | POA: Diagnosis not present

## 2023-07-20 DIAGNOSIS — I10 Essential (primary) hypertension: Secondary | ICD-10-CM

## 2023-07-20 DIAGNOSIS — K219 Gastro-esophageal reflux disease without esophagitis: Secondary | ICD-10-CM

## 2023-07-20 DIAGNOSIS — E782 Mixed hyperlipidemia: Secondary | ICD-10-CM

## 2023-07-20 MED ORDER — VALSARTAN-HYDROCHLOROTHIAZIDE 320-25 MG PO TABS
1.0000 | ORAL_TABLET | Freq: Every day | ORAL | 1 refills | Status: DC
Start: 2023-07-20 — End: 2024-08-30
  Filled 2023-10-15 – 2023-10-18 (×3): qty 90, 90d supply, fill #0

## 2023-07-20 NOTE — Patient Instructions (Signed)
Needs shingrix vaccine at the pharmacy. Schedule nurse visit for flu vaccine in the fall.

## 2023-07-20 NOTE — Assessment & Plan Note (Addendum)
Well controlled.  No changes to medicines.  Continue Valsartan/HCT 320/25 daily. Verapamil 240 mg daily.   Continue to work on eating a healthy diet and exercise.  Labs reviewed

## 2023-07-20 NOTE — Assessment & Plan Note (Addendum)
A1c worsened slightly.  Continue healthy diet and exercise.

## 2023-07-20 NOTE — Assessment & Plan Note (Addendum)
Well-controlled.   Continue rosuvastatin 10 mg daily. Continue healthy diet and exercise.

## 2023-07-20 NOTE — Assessment & Plan Note (Addendum)
At goal.  Continue omeprazole 20 mg daily.

## 2023-07-20 NOTE — Assessment & Plan Note (Signed)
Wears hearing aides 

## 2023-07-22 NOTE — Progress Notes (Signed)
Cardiology Office Note:    Date:  07/23/2023   ID:  [name]., DOB [birth year], MRN [id number]  PCP:  Blane Ohara, MD  Cardiologist:  Norman Herrlich, MD    Referring MD: Blane Ohara, MD    ASSESSMENT:    1. Nonrheumatic aortic valve stenosis   2. Essential hypertension   3. Mixed hyperlipidemia    PLAN:    In order of problems listed above:  Stable mild aortic stenosis recheck echocardiogram in 1 year see me afterwards continue endocarditis prophylaxis as well as effective antihypertensive therapy and lipid-lowering treatment as indicated with aortic stenosis Stable hypertension continue his current treatment with a combination ARB thiazide diuretic I encouraged him to check his blood pressure daily or several times a week record and bring to the office at follow-ups he has a validated blood pressure device Continue with statin LDL at target with aortic stenosis   Next appointment: I will see him in 1 year after his echocardiogram   Medication Adjustments/Labs and Tests Ordered: Current medicines are reviewed at length with the patient today.  Concerns regarding medicines are outlined above.  Orders Placed This Encounter  Procedures   EKG 12-Lead   No orders of the defined types were placed in this encounter.    History of Present Illness:    Conall Huffman. is a 82 y.o. male with a hx of Barrett's esophagus hypertension hyperlipidemia prediabetes and aortic stenosis last seen 07/09/2022.  His echocardiogram most recently performed 07/14/2022 showed mild aortic stenosis mean gradient of 15 mmHg valve area 1.1 cm.  Compliance with diet, lifestyle and medications: Yes  He continues to do well and has had no cardiovascular symptoms of edema shortness of breath orthopnea chest pain palpitation or syncope  He is on effective lipid-lowering therapy recent lipid profile with an LDL of 30 cholesterol 86 A1c 6.1 hemoglobin 14.0 creatinine 1.08 potassium 3.7 He had  no muscle pain or weakness from his statin He follows endocarditis prophylaxis for dental visits  Past Medical History:  Diagnosis Date   Acute pulmonary embolism without acute cor pulmonale (HCC) 09/02/2020   Arthritis    Degenerative lumbar spinal stenosis    Fatty liver    GERD (gastroesophageal reflux disease)    Headache    History of kidney stones    Hyperlipidemia    Hypertension    Mastodynia    Migraine without aura and without status migrainosus, not intractable    Neuritis or radiculitis due to rupture of lumbar intervertebral disc 07/15/2018   PE (pulmonary thromboembolism) (HCC)    Pre-diabetes    Radiculopathy of thoracolumbar region 07/15/2018   Radiculopathy of thoracolumbar region 07/15/2018    Current Medications: Current Meds  Medication Sig   fexofenadine (ALLEGRA) 180 MG tablet Take 180 mg by mouth daily.   fluticasone (FLONASE) 50 MCG/ACT nasal spray Place 2 sprays into both nostrils daily as needed for allergies.   KLOR-CON M20 20 MEQ tablet TAKE 1 TABLET BY MOUTH EVERY DAY   Multiple Vitamins-Minerals (PRESERVISION AREDS 2 PO) Take 1 capsule by mouth in the morning and at bedtime.   nystatin-triamcinolone (MYCOLOG II) cream Apply 1 Application topically 2 (two) times daily.   omeprazole (PRILOSEC) 20 MG capsule Take 20 mg by mouth daily before breakfast.    rosuvastatin (CRESTOR) 10 MG tablet TAKE 1 TABLET BY MOUTH EVERY DAY   valsartan-hydrochlorothiazide (DIOVAN-HCT) 320-25 MG tablet Take 1 tablet by mouth daily.   verapamil (CALAN-SR) 240 MG CR tablet  Take 240 mg by mouth daily.      EKGs/Labs/Other Studies Reviewed:    The following studies were reviewed today:  Cardiac Studies & Procedures       ECHOCARDIOGRAM  ECHOCARDIOGRAM COMPLETE 07/05/2023  Narrative ECHOCARDIOGRAM REPORT    Patient Name:   Maclaren Arroyave. Date of Exam: 07/05/2023 Medical Rec #:  [id number]           Height:       71.0 in Accession #:    [id number]           Weight:       233.4 lb Date of Birth:  1941/08/08           BSA:          2.252 m Patient Age:    82 years            BP:           138/70 mmHg Patient Gender: M                   HR:           53 bpm. Exam Location:  [facility]  Procedure: 2D Echo, Cardiac Doppler, Color Doppler and Strain Analysis  Indications:    Nonrheumatic aortic valve stenosis [I35.0 (ICD-10-CM)]  History:        Patient has prior history of Echocardiogram examinations, most recent 07/15/2022. Risk Factors:Hypertension, Dyslipidemia and Prediabetes.  Sonographer:    Louie Boston RDCS Referring Phys: [id number] Astryd Pearcy J Geraldin Habermehl  IMPRESSIONS   1. Left ventricular ejection fraction, by estimation, is 55 to 60%. The left ventricle has normal function. The left ventricle has no regional wall motion abnormalities. There is mild concentric left ventricular hypertrophy. Left ventricular diastolic parameters are consistent with Grade I diastolic dysfunction (impaired relaxation). The average left ventricular global longitudinal strain is -15.6 %. The global longitudinal strain is abnormal. 2. Right ventricular systolic function is normal. The right ventricular size is normal. There is normal pulmonary artery systolic pressure. 3. The mitral valve is normal in structure. No evidence of mitral valve regurgitation. No evidence of mitral stenosis. 4. Reduced SVI. The aortic valve is tricuspid. There is mild calcification of the aortic valve. Aortic valve regurgitation is not visualized. Mild aortic valve stenosis. 5. The inferior vena cava is normal in size with greater than 50% respiratory variability, suggesting right atrial pressure of 3 mmHg.  FINDINGS Left Ventricle: Left ventricular ejection fraction, by estimation, is 55 to 60%. The left ventricle has normal function. The left ventricle has no regional wall motion abnormalities. The average left ventricular global longitudinal strain is -15.6 %. The global longitudinal strain is  abnormal. The left ventricular internal cavity size was normal in size. There is mild concentric left ventricular hypertrophy. Left ventricular diastolic parameters are consistent with Grade I diastolic dysfunction (impaired relaxation). Indeterminate filling pressures.   LV Wall Scoring: There is diffuse normal.  Right Ventricle: The right ventricular size is normal. No increase in right ventricular wall thickness. Right ventricular systolic function is normal. There is normal pulmonary artery systolic pressure. The tricuspid regurgitant velocity is 2.30 m/s, and with an assumed right atrial pressure of 3 mmHg, the estimated right ventricular systolic pressure is 24.2 mmHg.  Left Atrium: Left atrial size was normal in size.  Right Atrium: Right atrial size was normal in size.  Pericardium: There is no evidence of pericardial effusion.  Mitral Valve: The mitral valve is normal in structure. No evidence  of mitral valve regurgitation. No evidence of mitral valve stenosis.  Tricuspid Valve: The tricuspid valve is normal in structure. Tricuspid valve regurgitation is mild . No evidence of tricuspid stenosis.  Aortic Valve: Reduced SVI. The aortic valve is tricuspid. There is mild calcification of the aortic valve. Aortic valve regurgitation is not visualized. Mild aortic stenosis is present. Aortic valve mean gradient measures 11.8 mmHg. Aortic valve peak gradient measures 21.9 mmHg. Aortic valve area, by VTI measures 1.04 cm.  Pulmonic Valve: The pulmonic valve was normal in structure. Pulmonic valve regurgitation is not visualized. No evidence of pulmonic stenosis.  Aorta: The aortic root is normal in size and structure and the aortic root, ascending aorta, aortic arch and descending aorta are all structurally normal, with no evidence of dilitation or obstruction.  Venous: A normal flow pattern is recorded from the right upper pulmonary vein. The inferior vena cava is normal in size with  greater than 50% respiratory variability, suggesting right atrial pressure of 3 mmHg.  IAS/Shunts: No atrial level shunt detected by color flow Doppler.   LEFT VENTRICLE PLAX 2D LVIDd:         4.30 cm   Diastology LVIDs:         3.10 cm   LV e' medial:    6.20 cm/s LV PW:         1.30 cm   LV E/e' medial:  15.5 LV IVS:        1.30 cm   LV e' lateral:   7.29 cm/s LVOT diam:     2.00 cm   LV E/e' lateral: 13.2 LV SV:         60 LV SV Index:   27        2D Longitudinal Strain LVOT Area:     3.14 cm  2D Strain GLS Avg:     -15.6 %   RIGHT VENTRICLE             IVC RV Basal diam:  3.10 cm     IVC diam: 1.20 cm RV S prime:     10.70 cm/s TAPSE (M-mode): 3.0 cm  LEFT ATRIUM           Index        RIGHT ATRIUM           Index LA diam:      4.10 cm 1.82 cm/m   RA Area:     13.10 cm LA Vol (A2C): 53.0 ml 23.54 ml/m  RA Volume:   30.10 ml  13.37 ml/m LA Vol (A4C): 36.2 ml 16.08 ml/m AORTIC VALVE AV Area (Vmax):    0.98 cm AV Area (Vmean):   0.97 cm AV Area (VTI):     1.04 cm AV Vmax:           234.20 cm/s AV Vmean:          159.200 cm/s AV VTI:            0.584 m AV Peak Grad:      21.9 mmHg AV Mean Grad:      11.8 mmHg LVOT Vmax:         73.38 cm/s LVOT Vmean:        49.125 cm/s LVOT VTI:          0.192 m LVOT/AV VTI ratio: 0.33  AORTA Ao Root diam: 3.40 cm Ao Asc diam:  2.90 cm Ao Desc diam: 2.10 cm  MV E velocity: 95.90 cm/s  TRICUSPID  VALVE MV A velocity: 83.80 cm/s  TR Peak grad:   21.2 mmHg MV E/A ratio:  1.14        TR Vmax:        230.00 cm/s  SHUNTS Systemic VTI:  0.19 m Systemic Diam: 2.00 cm  Norman Herrlich MD Electronically signed by Norman Herrlich MD Signature Date/Time: 07/05/2023/4:36:51 PM    Final             EKG Interpretation Date/Time:  Friday July 23 2023 08:31:41 EDT Ventricular Rate:  62 PR Interval:  254 QRS Duration:  114 QT Interval:  444 QTC Calculation: 450 R Axis:   -61  Text Interpretation: Sinus rhythm with 1st degree  A-V block Left axis deviation LAHB Poor R wave progression When compared with ECG of 02-Sep-2020 18:42, PREVIOUS ECG IS PRESENT Confirmed by Norman Herrlich ([id number]) on 07/23/2023 8:45:31 AM   Recent Labs: 07/16/2023: ALT 48; BUN 14; Creatinine, Ser 1.08; Hemoglobin 13.5; Platelets 170; Potassium 3.7; Sodium 142  Recent Lipid Panel    Component Value Date/Time   CHOL 86 (L) 07/16/2023 0834   TRIG 105 07/16/2023 0834   HDL 36 (L) 07/16/2023 0834   CHOLHDL 2.4 07/16/2023 0834   LDLCALC 30 07/16/2023 0834    Physical Exam:    VS:  BP 122/62 (BP Location: Right Arm, Patient Position: Sitting, Cuff Size: Normal)   Pulse 62   Ht 5\' 11"  (1.803 m)   Wt 231 lb (104.8 kg)   SpO2 94%   BMI 32.22 kg/m     Wt Readings from Last 3 Encounters:  07/23/23 231 lb (104.8 kg)  07/20/23 230 lb 3.2 oz (104.4 kg)  12/03/22 233 lb 6.4 oz (105.9 kg)     GEN:  Well nourished, well developed in no acute distress HEENT: Normal NECK: No JVD; No carotid bruits LYMPHATICS: No lymphadenopathy CARDIAC: Grade 2/6 ejection murmur does not encompass S2 is harsh is localized to the aortic area no aortic insufficiency RRR, no murmurs, rubs, gallops RESPIRATORY:  Clear to auscultation without rales, wheezing or rhonchi  ABDOMEN: Soft, non-tender, non-distended MUSCULOSKELETAL:  No edema; No deformity  SKIN: Warm and dry NEUROLOGIC:  Alert and oriented x 3 PSYCHIATRIC:  Normal affect    Signed, Norman Herrlich, MD  07/23/2023 9:01 AM    [facility]

## 2023-07-23 ENCOUNTER — Encounter: Payer: Self-pay | Admitting: Cardiology

## 2023-07-23 ENCOUNTER — Ambulatory Visit: Payer: PPO | Admitting: Cardiology

## 2023-07-23 ENCOUNTER — Encounter: Payer: Self-pay | Admitting: Family Medicine

## 2023-07-23 VITALS — BP 122/62 | HR 62 | Ht 71.0 in | Wt 231.0 lb

## 2023-07-23 DIAGNOSIS — E782 Mixed hyperlipidemia: Secondary | ICD-10-CM | POA: Diagnosis not present

## 2023-07-23 DIAGNOSIS — I350 Nonrheumatic aortic (valve) stenosis: Secondary | ICD-10-CM

## 2023-07-23 DIAGNOSIS — I10 Essential (primary) hypertension: Secondary | ICD-10-CM

## 2023-07-23 NOTE — Patient Instructions (Signed)
Medication Instructions:  Your physician recommends that you continue on your current medications as directed. Please refer to the Current Medication list given to you today.  *If you need a refill on your cardiac medications before your next appointment, please call your pharmacy*   Lab Work: None If you have labs (blood work) drawn today and your tests are completely normal, you will receive your results only by: [facility] (if you have MyChart) OR A paper copy in the mail If you have any lab test that is abnormal or we need to change your treatment, we will call you to review the results.   Testing/Procedures: Your physician has requested that you have an echocardiogram. Echocardiography is a painless test that uses sound waves to create images of your heart. It provides your doctor with information about the size and shape of your heart and how well your heart's chambers and valves are working. This procedure takes approximately one hour. There are no restrictions for this procedure. Please do NOT wear cologne, perfume, aftershave, or lotions (deodorant is allowed). Please arrive 15 minutes prior to your appointment time.    Follow-Up: At [facility], you and your health needs are our priority.  As part of our continuing mission to provide you with exceptional heart care, we have created designated Provider Care Teams.  These Care Teams include your primary Cardiologist (physician) and Advanced Practice Providers (APPs -  Physician Assistants and Nurse Practitioners) who all work together to provide you with the care you need, when you need it.  We recommend signing up for the patient portal called "MyChart".  Sign up information is provided on this After Visit Summary.  MyChart is used to connect with patients for Virtual Visits (Telemedicine).  Patients are able to view lab/test results, encounter notes, upcoming appointments, etc.  Non-urgent messages can be sent to your  provider as well.   To learn more about what you can do with MyChart, go to [email or web address].    Your next appointment:   1 year(s)  Provider:   Shirlee More, MD    Other Instructions None

## 2023-07-23 NOTE — Addendum Note (Signed)
Addended by: Roxanne Mins I on: 07/23/2023 09:12 AM   Modules accepted: Orders

## 2023-08-31 ENCOUNTER — Encounter: Payer: Self-pay | Admitting: Family Medicine

## 2023-08-31 ENCOUNTER — Telehealth (INDEPENDENT_AMBULATORY_CARE_PROVIDER_SITE_OTHER): Payer: PPO | Admitting: Family Medicine

## 2023-08-31 ENCOUNTER — Telehealth: Payer: Self-pay

## 2023-08-31 VITALS — BP 132/73 | HR 85 | Temp 99.5°F

## 2023-08-31 DIAGNOSIS — J069 Acute upper respiratory infection, unspecified: Secondary | ICD-10-CM | POA: Insufficient documentation

## 2023-08-31 DIAGNOSIS — Z20822 Contact with and (suspected) exposure to covid-19: Secondary | ICD-10-CM

## 2023-08-31 MED ORDER — ONDANSETRON HCL 4 MG PO TABS
4.0000 mg | ORAL_TABLET | Freq: Three times a day (TID) | ORAL | 0 refills | Status: DC | PRN
  Filled 2023-10-15: qty 20, 7d supply, fill #0
  Filled 2023-10-15: qty 30, 10d supply, fill #0
  Filled 2023-10-18: qty 20, 7d supply, fill #0

## 2023-08-31 MED ORDER — DEXTROMETHORPHAN HBR 15 MG/5ML PO SYRP: 10.0000 mL | ORAL_SOLUTION | Freq: Four times a day (QID) | ORAL | 0 refills | Status: DC | PRN

## 2023-08-31 NOTE — Progress Notes (Signed)
Virtual Visit via Video/Telehealth Note   This visit type was conducted due to national recommendations for restrictions regarding the COVID-19 Pandemic (e.g. social distancing) in an effort to limit this patient's exposure and mitigate transmission in our community.  Due to his co-morbid illnesses, this patient is at least at moderate risk for complications without adequate follow up.  This format is felt to be most appropriate for this patient at this time.  The patient had technical difficulties with video requiring transitioning to audio format (telephone) and video.  All issues noted in this document were discussed and addressed.  No physical exam could be performed with this format.  Patient verbally consented to a video/telehealth visit.   Date:  08/31/2023   ID:  Jesse Heading., DOB [birth year], MRN [id number]  Patient Location: Home Provider Location: Office/Clinic  PCP:  Jesse Ohara, MD   Evaluation Performed:  New Patient Evaluation  Chief Complaint:  Covid Exposure  History of Present Illness:    Jesse Litterio. is a 82 y.o. male with non productive cough, runny nose, nausea, vomiting, night sweats, congestion, and fever. Patient denies chest pain, shortness of breath, or trouble breathing. Patient denies diarrhea. Patient stated that his symptoms started on Sunday afternoon. He has not tested but his wife tested yesterday and was positive for Covid 19.  The patient does have symptoms concerning for COVID-19 infection (fever, chills, cough, or new shortness of breath).   HPI  Past Medical History:  Diagnosis Date   Acute pulmonary embolism without acute cor pulmonale (HCC) 09/02/2020   Arthritis    Degenerative lumbar spinal stenosis    Fatty liver    GERD (gastroesophageal reflux disease)    Headache    History of kidney stones    Hyperlipidemia    Hypertension    Mastodynia    Migraine without aura and without status migrainosus, not intractable     Neuritis or radiculitis due to rupture of lumbar intervertebral disc 07/15/2018   PE (pulmonary thromboembolism) (HCC)    Pre-diabetes    Radiculopathy of thoracolumbar region 07/15/2018   Radiculopathy of thoracolumbar region 07/15/2018   Past Surgical History:  Procedure Laterality Date   ANTERIOR LATERAL LUMBAR FUSION WITH PERCUTANEOUS SCREW 2 LEVEL N/A 07/13/2018   Procedure: ANTERIOR LATERAL LUMBAR FUSION L2-4;  Surgeon: Venita Lick, MD;  Location: [facility] OR;  Service: Orthopedics;  Laterality: N/A;  4 hrs   CHOLECYSTECTOMY  1999   GALLBLADDER SURGERY  2016   NECK SURGERY  1995   Ruptured disc repair   REVERSE SHOULDER ARTHROPLASTY Left 08/06/2022   Procedure: REVERSE SHOULDER ARTHROPLASTY;  Surgeon: Francena Hanly, MD;  Location: [facility] ORS;  Service: Orthopedics;  Laterality: Left;    SHOULDER SURGERY Left 2005   SHOULDER SURGERY Right 2016   THROAT SURGERY     stretching      Current Meds  Medication Sig   dextromethorphan 15 MG/5ML syrup Take 10 mLs (30 mg total) by mouth 4 (four) times daily as needed for cough.   ondansetron (ZOFRAN) 4 MG tablet Take 1 tablet (4 mg total) by mouth daily as needed for nausea or vomiting.     Allergies:   Penicillins   Social History   Tobacco Use   Smoking status: Former    Current packs/day: 0.00    Types: Cigarettes    Start date: 1965    Quit date: 1980    Years since quitting: 44.7   Smokeless tobacco: Never  Tobacco comments:    quit at least 43 years ago   Vaping Use   Vaping status: Never Used  Substance Use Topics   Alcohol use: No   Drug use: No     Family Hx: The patient's family history includes Cancer in his father; Diabetes in his brother; Heart disease in his sister; Hypertension in his mother. There is no history of Migraines.  Review of Systems  Constitutional:  Positive for diaphoresis and fever. Negative for chills and malaise/fatigue.  HENT:  Positive for congestion. Negative for sore throat.         Rhinorrhea    Respiratory:  Positive for cough. Negative for shortness of breath.   Cardiovascular:  Negative for chest pain.  Gastrointestinal:  Positive for nausea and vomiting. Negative for diarrhea.  Neurological:  Negative for dizziness and headaches.       Labs/Other Tests and Data Reviewed:    Recent Labs: 07/16/2023: ALT 48; BUN 14; Creatinine, Ser 1.08; Hemoglobin 13.5; Platelets 170; Potassium 3.7; Sodium 142   Recent Lipid Panel Lab Results  Component Value Date/Time   CHOL 86 (L) 07/16/2023 08:34 AM   TRIG 105 07/16/2023 08:34 AM   HDL 36 (L) 07/16/2023 08:34 AM   CHOLHDL 2.4 07/16/2023 08:34 AM   LDLCALC 30 07/16/2023 08:34 AM    Wt Readings from Last 3 Encounters:  07/23/23 231 lb (104.8 kg)  07/20/23 230 lb 3.2 oz (104.4 kg)  12/03/22 233 lb 6.4 oz (105.9 kg)     Objective:    Vital Signs:  BP 132/73   Pulse 85   Temp 99.5 F (37.5 C)    VITAL SIGNS:  reviewed GEN:  no acute distress RESPIRATORY:  normal respiratory effort, symmetric expansion NEURO:  alert and oriented x 3, no obvious focal deficit  ASSESSMENT & PLAN:    Exposure to COVID-19 virus Wife tested positive for Covid 19 on Monday   Viral upper respiratory tract infection with cough Continue to use your Allegra, Flonase and guaifenesin for symptoms Zofran 4 mg by mouth every 6 hours as needed for nausea/vomiting Dextromethorphan 10 ml by mouth every 6 hours for cough as needed     COVID-19 Education: The importance of social distancing was discussed today. You should stay away from people and practice attached CDC guidelines for isolation and masking. If your symptoms are getting worse: If you develop severe shortness of breath or difficulty breathing, chest pain, or a high fever that cannot be controlled with medication, go immediately to an emergency department or call 911.    Time:   Today, I have spent 10 minutes with the patient with telehealth technology discussing the above  problems.     Medication Adjustments/Labs and Tests Ordered: Current medicines are reviewed at length with the patient today.  Concerns regarding medicines are outlined above.   Tests Ordered: No orders of the defined types were placed in this encounter.   Medication Changes: Viral upper respiratory tract infection with cough Assessment & Plan: Continue to use your Allegra, Flonase and guaifenesin for symptoms Zofran 4 mg by mouth every 6 hours as needed for nausea/vomiting Dextromethorphan 10 ml by mouth every 6 hours for cough as needed    Orders: -     Ondansetron HCl; Take 1 tablet (4 mg total) by mouth daily as needed for nausea or vomiting.  Dispense: 30 tablet; Refill: 0 -     Dextromethorphan HBr; Take 10 mLs (30 mg total) by mouth 4 (four) times  daily as needed for cough.  Dispense: 120 mL; Refill: 0  Exposure to COVID-19 virus Assessment & Plan: Wife tested positive for Covid 19 on Monday       Follow Up:  In Person prn  Signed, Renne Crigler, FNP  08/31/2023 12:01 PM    [facility]

## 2023-08-31 NOTE — Telephone Encounter (Signed)
Error

## 2023-08-31 NOTE — Assessment & Plan Note (Signed)
Continue to use your Allegra, Flonase and guaifenesin for symptoms Zofran 4 mg by mouth every 6 hours as needed for nausea/vomiting Dextromethorphan 10 ml by mouth every 6 hours for cough as needed

## 2023-08-31 NOTE — Assessment & Plan Note (Signed)
Wife tested positive for Covid 19 on Monday

## 2023-09-13 ENCOUNTER — Other Ambulatory Visit: Payer: Self-pay | Admitting: Family Medicine

## 2023-09-13 DIAGNOSIS — I10 Essential (primary) hypertension: Secondary | ICD-10-CM

## 2023-10-04 DIAGNOSIS — K219 Gastro-esophageal reflux disease without esophagitis: Secondary | ICD-10-CM | POA: Diagnosis not present

## 2023-10-04 DIAGNOSIS — K2270 Barrett's esophagus without dysplasia: Secondary | ICD-10-CM | POA: Diagnosis not present

## 2023-10-04 DIAGNOSIS — R1310 Dysphagia, unspecified: Secondary | ICD-10-CM | POA: Diagnosis not present

## 2023-10-04 DIAGNOSIS — K222 Esophageal obstruction: Secondary | ICD-10-CM | POA: Diagnosis not present

## 2023-10-07 DIAGNOSIS — K222 Esophageal obstruction: Secondary | ICD-10-CM | POA: Diagnosis not present

## 2023-10-07 DIAGNOSIS — R1310 Dysphagia, unspecified: Secondary | ICD-10-CM | POA: Diagnosis not present

## 2023-10-11 DIAGNOSIS — L570 Actinic keratosis: Secondary | ICD-10-CM | POA: Diagnosis not present

## 2023-10-11 DIAGNOSIS — H61001 Unspecified perichondritis of right external ear: Secondary | ICD-10-CM | POA: Diagnosis not present

## 2023-10-14 ENCOUNTER — Other Ambulatory Visit (HOSPITAL_COMMUNITY): Payer: Self-pay

## 2023-10-15 ENCOUNTER — Other Ambulatory Visit (HOSPITAL_BASED_OUTPATIENT_CLINIC_OR_DEPARTMENT_OTHER): Payer: Self-pay

## 2023-10-15 ENCOUNTER — Other Ambulatory Visit (HOSPITAL_COMMUNITY): Payer: Self-pay

## 2023-10-15 MED ORDER — OMEPRAZOLE 20 MG PO CPDR
20.0000 mg | DELAYED_RELEASE_CAPSULE | Freq: Every morning | ORAL | 4 refills | Status: DC
Start: 2022-11-02 — End: 2023-12-13
  Filled 2023-10-15 – 2023-10-19 (×4): qty 90, 90d supply, fill #0

## 2023-10-15 MED ORDER — TRIAMCINOLONE ACETONIDE 0.5 % EX OINT
TOPICAL_OINTMENT | Freq: Two times a day (BID) | CUTANEOUS | 2 refills | Status: AC
Start: 2023-07-06 — End: ?
  Filled 2023-10-15 (×2): qty 45, 30d supply, fill #0
  Filled 2023-10-18 (×2): qty 45, 15d supply, fill #0

## 2023-10-15 MED FILL — Verapamil HCl Tab ER 240 MG: ORAL | 180 days supply | Qty: 180 | Fill #0 | Status: CN

## 2023-10-15 MED FILL — Verapamil HCl Tab ER 240 MG: ORAL | 80 days supply | Qty: 80 | Fill #0 | Status: CN

## 2023-10-16 ENCOUNTER — Other Ambulatory Visit: Payer: Self-pay | Admitting: Family Medicine

## 2023-10-16 MED ORDER — ROSUVASTATIN CALCIUM 10 MG PO TABS
10.0000 mg | ORAL_TABLET | Freq: Every day | ORAL | 1 refills | Status: DC
  Filled 2023-10-16 – 2023-12-22 (×6): qty 90, 90d supply, fill #0
  Filled 2024-03-23: qty 90, 90d supply, fill #1

## 2023-10-16 MED ORDER — POTASSIUM CHLORIDE CRYS ER 20 MEQ PO TBCR
20.0000 meq | EXTENDED_RELEASE_TABLET | Freq: Every day | ORAL | 1 refills | Status: DC
  Filled 2023-10-16 – 2023-12-22 (×5): qty 90, 90d supply, fill #0
  Filled 2024-03-23: qty 90, 90d supply, fill #1

## 2023-10-18 ENCOUNTER — Other Ambulatory Visit (HOSPITAL_BASED_OUTPATIENT_CLINIC_OR_DEPARTMENT_OTHER): Payer: Self-pay

## 2023-10-18 ENCOUNTER — Other Ambulatory Visit: Payer: Self-pay | Admitting: Family Medicine

## 2023-10-18 ENCOUNTER — Other Ambulatory Visit (HOSPITAL_COMMUNITY): Payer: Self-pay

## 2023-10-18 DIAGNOSIS — I10 Essential (primary) hypertension: Secondary | ICD-10-CM

## 2023-10-18 MED ORDER — VALSARTAN-HYDROCHLOROTHIAZIDE 320-25 MG PO TABS
1.0000 | ORAL_TABLET | Freq: Every day | ORAL | 1 refills | Status: DC
Start: 2023-10-18 — End: 2024-07-17
  Filled 2024-01-18: qty 90, 90d supply, fill #0
  Filled 2024-04-22: qty 90, 90d supply, fill #1

## 2023-10-18 MED FILL — Verapamil HCl Tab ER 240 MG: ORAL | 80 days supply | Qty: 80 | Fill #0 | Status: CN

## 2023-10-19 ENCOUNTER — Other Ambulatory Visit (HOSPITAL_COMMUNITY): Payer: Self-pay

## 2023-10-19 ENCOUNTER — Other Ambulatory Visit (HOSPITAL_BASED_OUTPATIENT_CLINIC_OR_DEPARTMENT_OTHER): Payer: Self-pay

## 2023-10-20 ENCOUNTER — Other Ambulatory Visit (HOSPITAL_COMMUNITY): Payer: Self-pay

## 2023-10-22 ENCOUNTER — Other Ambulatory Visit (HOSPITAL_BASED_OUTPATIENT_CLINIC_OR_DEPARTMENT_OTHER): Payer: Self-pay

## 2023-10-25 ENCOUNTER — Other Ambulatory Visit: Payer: Self-pay

## 2023-10-25 ENCOUNTER — Other Ambulatory Visit (HOSPITAL_BASED_OUTPATIENT_CLINIC_OR_DEPARTMENT_OTHER): Payer: Self-pay

## 2023-10-25 MED ORDER — NITRO-BID 2 % TD OINT
TOPICAL_OINTMENT | Freq: Two times a day (BID) | TRANSDERMAL | 2 refills | Status: DC
  Filled 2023-10-25: qty 30, 30d supply, fill #0

## 2023-10-26 ENCOUNTER — Other Ambulatory Visit (HOSPITAL_BASED_OUTPATIENT_CLINIC_OR_DEPARTMENT_OTHER): Payer: Self-pay

## 2023-10-27 ENCOUNTER — Other Ambulatory Visit (HOSPITAL_BASED_OUTPATIENT_CLINIC_OR_DEPARTMENT_OTHER): Payer: Self-pay

## 2023-11-01 DIAGNOSIS — R1310 Dysphagia, unspecified: Secondary | ICD-10-CM | POA: Diagnosis not present

## 2023-12-08 ENCOUNTER — Other Ambulatory Visit (HOSPITAL_BASED_OUTPATIENT_CLINIC_OR_DEPARTMENT_OTHER): Payer: Self-pay

## 2023-12-09 ENCOUNTER — Encounter (HOSPITAL_BASED_OUTPATIENT_CLINIC_OR_DEPARTMENT_OTHER): Payer: Self-pay | Admitting: Pharmacy Technician

## 2023-12-09 ENCOUNTER — Other Ambulatory Visit (HOSPITAL_BASED_OUTPATIENT_CLINIC_OR_DEPARTMENT_OTHER): Payer: Self-pay

## 2023-12-10 ENCOUNTER — Other Ambulatory Visit (HOSPITAL_BASED_OUTPATIENT_CLINIC_OR_DEPARTMENT_OTHER): Payer: Self-pay

## 2023-12-13 ENCOUNTER — Other Ambulatory Visit (HOSPITAL_BASED_OUTPATIENT_CLINIC_OR_DEPARTMENT_OTHER): Payer: Self-pay

## 2023-12-13 MED ORDER — OMEPRAZOLE 20 MG PO CPDR
20.0000 mg | DELAYED_RELEASE_CAPSULE | Freq: Every morning | ORAL | 4 refills | Status: DC
  Filled 2023-12-13: qty 90, 90d supply, fill #0
  Filled 2024-03-10: qty 90, 90d supply, fill #1
  Filled 2024-06-06: qty 90, 90d supply, fill #2

## 2023-12-22 ENCOUNTER — Other Ambulatory Visit (HOSPITAL_BASED_OUTPATIENT_CLINIC_OR_DEPARTMENT_OTHER): Payer: Self-pay

## 2023-12-22 MED FILL — Verapamil HCl Tab ER 240 MG: ORAL | 80 days supply | Qty: 80 | Fill #0 | Status: AC

## 2023-12-23 ENCOUNTER — Other Ambulatory Visit (HOSPITAL_BASED_OUTPATIENT_CLINIC_OR_DEPARTMENT_OTHER): Payer: Self-pay

## 2023-12-24 ENCOUNTER — Other Ambulatory Visit (HOSPITAL_BASED_OUTPATIENT_CLINIC_OR_DEPARTMENT_OTHER): Payer: Self-pay

## 2023-12-27 ENCOUNTER — Other Ambulatory Visit (HOSPITAL_BASED_OUTPATIENT_CLINIC_OR_DEPARTMENT_OTHER): Payer: Self-pay

## 2024-01-17 ENCOUNTER — Other Ambulatory Visit: Payer: PPO

## 2024-01-17 DIAGNOSIS — I10 Essential (primary) hypertension: Secondary | ICD-10-CM

## 2024-01-17 DIAGNOSIS — E782 Mixed hyperlipidemia: Secondary | ICD-10-CM

## 2024-01-17 DIAGNOSIS — R7303 Prediabetes: Secondary | ICD-10-CM | POA: Diagnosis not present

## 2024-01-18 ENCOUNTER — Other Ambulatory Visit (HOSPITAL_BASED_OUTPATIENT_CLINIC_OR_DEPARTMENT_OTHER): Payer: Self-pay

## 2024-01-18 ENCOUNTER — Encounter: Payer: Self-pay | Admitting: Family Medicine

## 2024-01-18 LAB — CBC WITH DIFFERENTIAL/PLATELET
Basophils Absolute: 0.1 10*3/uL (ref 0.0–0.2)
Basos: 1 %
EOS (ABSOLUTE): 0.1 10*3/uL (ref 0.0–0.4)
Eos: 2 %
Hematocrit: 41.5 % (ref 37.5–51.0)
Hemoglobin: 13.8 g/dL (ref 13.0–17.7)
Immature Grans (Abs): 0 10*3/uL (ref 0.0–0.1)
Immature Granulocytes: 0 %
Lymphocytes Absolute: 1.2 10*3/uL (ref 0.7–3.1)
Lymphs: 25 %
MCH: 29.6 pg (ref 26.6–33.0)
MCHC: 33.3 g/dL (ref 31.5–35.7)
MCV: 89 fL (ref 79–97)
Monocytes Absolute: 0.4 10*3/uL (ref 0.1–0.9)
Monocytes: 9 %
Neutrophils Absolute: 3 10*3/uL (ref 1.4–7.0)
Neutrophils: 63 %
Platelets: 151 10*3/uL (ref 150–450)
RBC: 4.67 x10E6/uL (ref 4.14–5.80)
RDW: 13.2 % (ref 11.6–15.4)
WBC: 4.8 10*3/uL (ref 3.4–10.8)

## 2024-01-18 LAB — LIPID PANEL
Chol/HDL Ratio: 2.1 {ratio} (ref 0.0–5.0)
Cholesterol, Total: 95 mg/dL — ABNORMAL LOW (ref 100–199)
HDL: 45 mg/dL (ref >39)
LDL Chol Calc (NIH): 29 mg/dL (ref 0–99)
Triglycerides: 114 mg/dL (ref 0–149)
VLDL Cholesterol Cal: 21 mg/dL (ref 5–40)

## 2024-01-18 LAB — CMP14+EGFR
ALT: 24 [IU]/L (ref 0–44)
AST: 25 [IU]/L (ref 0–40)
Albumin: 4.4 g/dL (ref 3.7–4.7)
Alkaline Phosphatase: 76 [IU]/L (ref 44–121)
BUN/Creatinine Ratio: 12 (ref 10–24)
BUN: 13 mg/dL (ref 8–27)
Bilirubin Total: 0.6 mg/dL (ref 0.0–1.2)
CO2: 23 mmol/L (ref 20–29)
Calcium: 9.7 mg/dL (ref 8.6–10.2)
Chloride: 103 mmol/L (ref 96–106)
Creatinine, Ser: 1.09 mg/dL (ref 0.76–1.27)
Globulin, Total: 2.4 g/dL (ref 1.5–4.5)
Glucose: 92 mg/dL (ref 70–99)
Potassium: 3.6 mmol/L (ref 3.5–5.2)
Sodium: 145 mmol/L — ABNORMAL HIGH (ref 134–144)
Total Protein: 6.8 g/dL (ref 6.0–8.5)
eGFR: 68 mL/min/{1.73_m2} (ref >59)

## 2024-01-18 LAB — HEMOGLOBIN A1C
Est. average glucose Bld gHb Est-mCnc: 128 mg/dL
Hgb A1c MFr Bld: 6.1 % — ABNORMAL HIGH (ref 4.8–5.6)

## 2024-01-18 NOTE — Progress Notes (Signed)
 Subjective:  Patient ID: Jesse [name]., male    DOB: [birth year]  Age: 83 y.o. MRN: [id number]  Chief Complaint  Patient presents with   Medical Management of Chronic Issues    HPI   Prediabetes: eating healthy and low carbs diet. Exercising.    Hypertension: He is taking Valsartan/HYDROCHLOROTHIAZIDE 320/25 mg daily and verapamil 240 mg at bedtime.    GERD: He takes Omeprazole 20 mg daily. Patient saw Dr. Jennye Boroughs and did EGD with stricture and dilitation.   Hyperlipidemia:  Rosuvastatin 10 mg daily.  Healthy diet and exercise.    Allergic rhinitis: on allegra daily. Simple saline.   KNOT ON LEFT NIPPLE. BEEN THERE FOR YEARS.      07/20/2023   11:30 AM 06/02/2023    2:03 PM 05/06/2022    9:15 AM 01/06/2022    8:09 AM 11/04/2021    3:48 PM  Depression screen PHQ 2/9  Decreased Interest 0 0 0 0 0  Down, Depressed, Hopeless 0 0 0 0 0  PHQ - 2 Score 0 0 0 0 0  Altered sleeping 0      Tired, decreased energy 0      Change in appetite 0      Feeling bad or failure about yourself  0      Trouble concentrating 0      Moving slowly or fidgety/restless 0      Suicidal thoughts 0      PHQ-9 Score 0      Difficult doing work/chores Not difficult at all            07/20/2023   11:30 AM  Fall Risk   Falls in the past year? 0  Number falls in past yr: 0  Injury with Fall? 0  Risk for fall due to : No Fall Risks  Follow up Falls evaluation completed;Follow up appointment    Patient Care Team: Blane Ohara, MD as PCP - General (Family Medicine) Misenheimer, Marcial Pacas, MD as Consulting Physician (Unknown Physician Specialty) Francena Hanly, MD as Consulting Physician (Orthopedic Surgery) [facility] (Ophthalmology)   Review of Systems  Constitutional:  Negative for chills, diaphoresis, fatigue and fever.  HENT:  Negative for congestion, ear pain and sore throat.   Respiratory:  Negative for cough and shortness of breath.   Cardiovascular:  Negative for  chest pain and leg swelling.  Gastrointestinal:  Negative for abdominal pain, constipation, diarrhea, nausea and vomiting.  Genitourinary:  Negative for dysuria and urgency.  Musculoskeletal:  Negative for arthralgias and myalgias.  Neurological:  Negative for dizziness and headaches.  Psychiatric/Behavioral:  Negative for dysphoric mood.     Current Outpatient Medications on File Prior to Visit  Medication Sig Dispense Refill   fexofenadine (ALLEGRA) 180 MG tablet Take 180 mg by mouth daily.     fluticasone (FLONASE) 50 MCG/ACT nasal spray Place 2 sprays into both nostrils daily as needed for allergies.     Multiple Vitamins-Minerals (PRESERVISION AREDS 2 PO) Take 1 capsule by mouth in the morning and at bedtime.     omeprazole (PRILOSEC) 20 MG capsule Take 20 mg by mouth daily before breakfast.      omeprazole (PRILOSEC) 20 MG capsule Take 1 capsule (20 mg total) by mouth every morning 30 minutes before breakfast 90 capsule 4   potassium chloride SA (KLOR-CON M20) 20 MEQ tablet Take 1 tablet (20 mEq total) by mouth daily. 90 tablet 1   rosuvastatin (CRESTOR) 10 MG tablet Take 1 tablet (  10 mg total) by mouth daily. 90 tablet 1   triamcinolone ointment (KENALOG) 0.5 % Apply topically to affected area 2 (two) times daily for 2 weeks as needed 45 g 2   valsartan-hydrochlorothiazide (DIOVAN-HCT) 320-25 MG tablet Take 1 tablet by mouth daily. 90 tablet 1   valsartan-hydrochlorothiazide (DIOVAN-HCT) 320-25 MG tablet Take 1 tablet by mouth daily. 90 tablet 1   verapamil (CALAN-SR) 240 MG CR tablet Take 1 tablet (240 mg total) by mouth daily. 180 tablet 0   No current facility-administered medications on file prior to visit.   Past Medical History:  Diagnosis Date   Acute pulmonary embolism without acute cor pulmonale (HCC) 09/02/2020   Arthritis    Degenerative lumbar spinal stenosis    Fatty liver    GERD (gastroesophageal reflux disease)    Headache    History of kidney stones     Hyperlipidemia    Hypertension    Mastodynia    Migraine without aura and without status migrainosus, not intractable    Neuritis or radiculitis due to rupture of lumbar intervertebral disc 07/15/2018   PE (pulmonary thromboembolism) (HCC)    Pre-diabetes    Radiculopathy of thoracolumbar region 07/15/2018   Radiculopathy of thoracolumbar region 07/15/2018   Past Surgical History:  Procedure Laterality Date   ANTERIOR LATERAL LUMBAR FUSION WITH PERCUTANEOUS SCREW 2 LEVEL N/A 07/13/2018   Procedure: ANTERIOR LATERAL LUMBAR FUSION L2-4;  Surgeon: Venita Lick, MD;  Location: [facility] OR;  Service: Orthopedics;  Laterality: N/A;  4 hrs   CHOLECYSTECTOMY  1999   GALLBLADDER SURGERY  2016   NECK SURGERY  1995   Ruptured disc repair   REVERSE SHOULDER ARTHROPLASTY Left 08/06/2022   Procedure: REVERSE SHOULDER ARTHROPLASTY;  Surgeon: Francena Hanly, MD;  Location: [facility] OR[facility];  Service: Orthopedics;  Laterality: Left;    SHOULDER SURGERY Left 2005   SHOULDER SURGERY Right 2016   THROAT SURGERY     stretching    Family History  Problem Relation Age of Onset   Hypertension Mother    Cancer Father    Heart disease Sister    Diabetes Brother    Migraines Neg Hx    Social History   Socioeconomic History   Marital status: Married    Spouse name: Darel Hong   Number of children: 4   Years of education: 12+   Highest education level: Some college, no degree  Occupational History   Occupation: Retired Visual merchandiser  Tobacco Use   Smoking status: Former    Current packs/day: 0.00    Types: Cigarettes    Start date: 1965    Quit date: 1980    Years since quitting: 45.1   Smokeless tobacco: Never   Tobacco comments:    quit at least 43 years ago   Vaping Use   Vaping status: Never Used  Substance and Sexual Activity   Alcohol use: No   Drug use: No   Sexual activity: Not Currently  Other Topics Concern   Not on file  Social History Narrative   Lives with wife   Caffeine use: Drinks decaf  coffee and tea   Writes right-handed, eats and shaves left-handed   Social Drivers of Health   Financial Resource Strain: Low Risk  (01/18/2024)   Overall Financial Resource Strain (CARDIA)    Difficulty of Paying Living Expenses: Not hard at all  Food Insecurity: No Food Insecurity (01/18/2024)   Hunger Vital Sign    Worried About Running Out of Food in the Last  Year: Never true    Ran Out of Food in the Last Year: Never true  Transportation Needs: No Transportation Needs (01/18/2024)   PRAPARE - Administrator, Civil Service (Medical): No    Lack of Transportation (Non-Medical): No  Physical Activity: Sufficiently Active (01/18/2024)   Exercise Vital Sign    Days of Exercise per Week: 5 days    Minutes of Exercise per Session: 30 min  Stress: No Stress Concern Present (01/18/2024)   [employer] of Occupational Health - Occupational Stress Questionnaire    Feeling of Stress : Not at all  Social Connections: Socially Integrated (01/18/2024)   Social Connection and Isolation Panel [NHANES]    Frequency of Communication with Friends and Family: More than three times a week    Frequency of Social Gatherings with Friends and Family: Three times a week    Attends Religious Services: More than 4 times per year    Active Member of Clubs or Organizations: Yes    Attends Banker Meetings: 1 to 4 times per year    Marital Status: Married    Objective:  BP 136/68   Pulse 60   Temp 97.6 F (36.4 C)   Ht 5\' 11"  (1.803 m)   Wt 232 lb (105.2 kg)   SpO2 90%   BMI 32.36 kg/m      01/19/2024   11:50 AM 08/31/2023   11:37 AM 07/23/2023    8:29 AM  BP/Weight  Systolic BP 136 132 122  Diastolic BP 68 73 62  Wt. (Lbs) 232  231  BMI 32.36 kg/m2  32.22 kg/m2    Physical Exam Vitals reviewed.  Constitutional:      Appearance: Normal appearance.  Neck:     Vascular: No carotid bruit.  Cardiovascular:     Rate and Rhythm: Normal rate and regular rhythm.      Heart sounds: Normal heart sounds.  Pulmonary:     Effort: Pulmonary effort is normal.     Breath sounds: Normal breath sounds. No wheezing, rhonchi or rales.  Chest:    Abdominal:     General: Bowel sounds are normal.     Palpations: Abdomen is soft.     Tenderness: There is no abdominal tenderness.  Neurological:     Mental Status: He is alert and oriented to person, place, and time.  Psychiatric:        Mood and Affect: Mood normal.        Behavior: Behavior normal.     Diabetic Foot Exam - Simple   No data filed      Lab Results  Component Value Date   WBC 4.8 01/17/2024   HGB 13.8 01/17/2024   HCT 41.5 01/17/2024   PLT 151 01/17/2024   GLUCOSE 92 01/17/2024   CHOL 95 (L) 01/17/2024   TRIG 114 01/17/2024   HDL 45 01/17/2024   LDLCALC 29 01/17/2024   ALT 24 01/17/2024   AST 25 01/17/2024   NA 145 (H) 01/17/2024   K 3.6 01/17/2024   CL 103 01/17/2024   CREATININE 1.09 01/17/2024   BUN 13 01/17/2024   CO2 23 01/17/2024   INR 1.6 (H) 09/02/2020   HGBA1C 6.1 (H) 01/17/2024      Assessment & Plan:    Essential hypertension Assessment & Plan: Well controlled.  No changes to medicines.  Continue Valsartan/HCT 320/25 daily. Verapamil 240 mg daily.   Continue to work on eating a healthy diet and exercise.  Labs  reviewed   Gastroesophageal reflux disease without esophagitis Assessment & Plan: At goal.  Continue omeprazole 20 mg daily.    Prediabetes Assessment & Plan: Continue healthy diet and exercise.   Mixed hyperlipidemia Assessment & Plan: Well-controlled.   Continue rosuvastatin 10 mg daily. Continue healthy diet and exercise.   Labs reviewed.   Gynecomastia Assessment & Plan: Previous mammogram consistent with gynecomastia. Previous labs normal.        No orders of the defined types were placed in this encounter.   No orders of the defined types were placed in this encounter.    Follow-up: Return in about 5 days (around  01/24/2024) for Nurse visit ear irrigation., chronic follow up in 6 months. Clayborn Bigness I Leal-Borjas,acting as a scribe for Blane Ohara, MD.,have documented all relevant documentation on the behalf of Blane Ohara, MD,as directed by  Blane Ohara, MD while in the presence of Blane Ohara, MD.   An After Visit Summary was printed and given to the patient.  I attest that I have reviewed this visit and agree with the plan scribed by my staff.   Blane Ohara, MD [facility] [phone]

## 2024-01-19 ENCOUNTER — Encounter: Payer: Self-pay | Admitting: Family Medicine

## 2024-01-19 ENCOUNTER — Ambulatory Visit (INDEPENDENT_AMBULATORY_CARE_PROVIDER_SITE_OTHER): Payer: PPO | Admitting: Family Medicine

## 2024-01-19 VITALS — BP 136/68 | HR 60 | Temp 97.6°F | Ht 71.0 in | Wt 232.0 lb

## 2024-01-19 DIAGNOSIS — E782 Mixed hyperlipidemia: Secondary | ICD-10-CM

## 2024-01-19 DIAGNOSIS — N62 Hypertrophy of breast: Secondary | ICD-10-CM | POA: Diagnosis not present

## 2024-01-19 DIAGNOSIS — R7303 Prediabetes: Secondary | ICD-10-CM

## 2024-01-19 DIAGNOSIS — K219 Gastro-esophageal reflux disease without esophagitis: Secondary | ICD-10-CM

## 2024-01-19 DIAGNOSIS — I10 Essential (primary) hypertension: Secondary | ICD-10-CM | POA: Diagnosis not present

## 2024-01-23 DIAGNOSIS — N62 Hypertrophy of breast: Secondary | ICD-10-CM | POA: Insufficient documentation

## 2024-01-23 NOTE — Assessment & Plan Note (Signed)
 Well-controlled.   Continue rosuvastatin 10 mg daily. Continue healthy diet and exercise.   Labs reviewed.

## 2024-01-23 NOTE — Assessment & Plan Note (Signed)
 Continue healthy diet and exercise.

## 2024-01-23 NOTE — Assessment & Plan Note (Signed)
Well controlled.  No changes to medicines.  Continue Valsartan/HCT 320/25 daily. Verapamil 240 mg daily.   Continue to work on eating a healthy diet and exercise.  Labs reviewed

## 2024-01-23 NOTE — Assessment & Plan Note (Signed)
 Previous mammogram consistent with gynecomastia. Previous labs normal.

## 2024-01-23 NOTE — Assessment & Plan Note (Signed)
At goal.  Continue omeprazole 20 mg daily.

## 2024-01-24 ENCOUNTER — Ambulatory Visit (INDEPENDENT_AMBULATORY_CARE_PROVIDER_SITE_OTHER): Payer: PPO

## 2024-01-24 DIAGNOSIS — H6122 Impacted cerumen, left ear: Secondary | ICD-10-CM

## 2024-01-24 NOTE — Progress Notes (Signed)
 Patient is in office today for a nurse visit for Ear Lavage. Patient Ear Irrigation was preformed  on left ear.

## 2024-03-08 DIAGNOSIS — H903 Sensorineural hearing loss, bilateral: Secondary | ICD-10-CM | POA: Diagnosis not present

## 2024-03-10 ENCOUNTER — Other Ambulatory Visit: Payer: Self-pay | Admitting: Family Medicine

## 2024-03-10 ENCOUNTER — Other Ambulatory Visit (HOSPITAL_BASED_OUTPATIENT_CLINIC_OR_DEPARTMENT_OTHER): Payer: Self-pay

## 2024-03-10 DIAGNOSIS — I10 Essential (primary) hypertension: Secondary | ICD-10-CM

## 2024-03-10 MED ORDER — VERAPAMIL HCL ER 240 MG PO TBCR
240.0000 mg | EXTENDED_RELEASE_TABLET | Freq: Every day | ORAL | 0 refills | Status: DC
  Filled 2024-03-10: qty 100, 100d supply, fill #0
  Filled 2024-06-20: qty 80, 80d supply, fill #1

## 2024-03-13 ENCOUNTER — Other Ambulatory Visit (HOSPITAL_BASED_OUTPATIENT_CLINIC_OR_DEPARTMENT_OTHER): Payer: Self-pay

## 2024-03-23 ENCOUNTER — Other Ambulatory Visit (HOSPITAL_BASED_OUTPATIENT_CLINIC_OR_DEPARTMENT_OTHER): Payer: Self-pay

## 2024-04-10 ENCOUNTER — Other Ambulatory Visit (HOSPITAL_BASED_OUTPATIENT_CLINIC_OR_DEPARTMENT_OTHER): Payer: Self-pay

## 2024-04-10 DIAGNOSIS — J342 Deviated nasal septum: Secondary | ICD-10-CM | POA: Diagnosis not present

## 2024-04-10 DIAGNOSIS — H9191 Unspecified hearing loss, right ear: Secondary | ICD-10-CM | POA: Diagnosis not present

## 2024-04-10 DIAGNOSIS — H6591 Unspecified nonsuppurative otitis media, right ear: Secondary | ICD-10-CM | POA: Diagnosis not present

## 2024-04-10 DIAGNOSIS — H9071 Mixed conductive and sensorineural hearing loss, unilateral, right ear, with unrestricted hearing on the contralateral side: Secondary | ICD-10-CM | POA: Diagnosis not present

## 2024-04-10 MED ORDER — FLUTICASONE PROPIONATE 50 MCG/ACT NA SUSP
2.0000 | Freq: Every day | NASAL | 5 refills | Status: DC
  Filled 2024-04-10: qty 16, 30d supply, fill #0
  Filled 2024-05-05: qty 16, 30d supply, fill #1
  Filled 2024-06-06: qty 16, 30d supply, fill #2
  Filled 2024-07-05: qty 16, 30d supply, fill #3
  Filled 2024-08-04: qty 16, 30d supply, fill #4

## 2024-04-10 MED ORDER — PREDNISONE 10 MG PO TABS
ORAL_TABLET | ORAL | 0 refills | Status: AC
  Filled 2024-04-10: qty 30, 12d supply, fill #0

## 2024-04-24 ENCOUNTER — Other Ambulatory Visit (HOSPITAL_BASED_OUTPATIENT_CLINIC_OR_DEPARTMENT_OTHER): Payer: Self-pay

## 2024-04-28 DIAGNOSIS — H6591 Unspecified nonsuppurative otitis media, right ear: Secondary | ICD-10-CM | POA: Diagnosis not present

## 2024-04-28 DIAGNOSIS — J343 Hypertrophy of nasal turbinates: Secondary | ICD-10-CM | POA: Diagnosis not present

## 2024-04-28 DIAGNOSIS — H9193 Unspecified hearing loss, bilateral: Secondary | ICD-10-CM | POA: Diagnosis not present

## 2024-04-28 DIAGNOSIS — I10 Essential (primary) hypertension: Secondary | ICD-10-CM | POA: Diagnosis not present

## 2024-05-05 ENCOUNTER — Other Ambulatory Visit (HOSPITAL_BASED_OUTPATIENT_CLINIC_OR_DEPARTMENT_OTHER): Payer: Self-pay

## 2024-06-06 ENCOUNTER — Other Ambulatory Visit (HOSPITAL_BASED_OUTPATIENT_CLINIC_OR_DEPARTMENT_OTHER): Payer: Self-pay

## 2024-06-06 ENCOUNTER — Ambulatory Visit: Payer: PPO

## 2024-06-06 ENCOUNTER — Telehealth: Payer: Self-pay

## 2024-06-06 ENCOUNTER — Other Ambulatory Visit: Payer: Self-pay

## 2024-06-06 DIAGNOSIS — I350 Nonrheumatic aortic (valve) stenosis: Secondary | ICD-10-CM

## 2024-06-06 NOTE — Telephone Encounter (Signed)
 Echo order had expired and was reordered.

## 2024-06-06 NOTE — Addendum Note (Signed)
 Addended by: ONEITA BERLINER on: 06/06/2024 10:58 AM   Modules accepted: Orders

## 2024-06-08 ENCOUNTER — Ambulatory Visit

## 2024-06-08 VITALS — Ht 71.0 in | Wt 232.0 lb

## 2024-06-08 DIAGNOSIS — Z0000 Encounter for general adult medical examination without abnormal findings: Secondary | ICD-10-CM

## 2024-06-08 NOTE — Patient Instructions (Signed)
 Mr. Dragoo , Thank you for taking time out of your busy schedule to complete your Annual Wellness Visit with me. I enjoyed our conversation and look forward to speaking with you again next year. I, as well as your care team,  appreciate your ongoing commitment to your health goals. Please review the following plan we discussed and let me know if I can assist you in the future. Your Game plan/ To Do List    Follow up Visits: Next Medicare AWV with our clinical staff: In 1 year  Have you seen your provider in the last 6 months (3 months if uncontrolled diabetes)? Yes Next Office Visit with your provider: 07/19/24 @ 9:20  Clinician Recommendations:  Aim for 30 minutes of exercise or brisk walking, 6-8 glasses of water, and 5 servings of fruits and vegetables each day.       This is a list of the screening recommended for you and due dates:  Health Maintenance  Topic Date Due   Zoster (Shingles) Vaccine (1 of 2) Never done   DTaP/Tdap/Td vaccine (2 - Td or Tdap) 07/26/2023   COVID-19 Vaccine (5 - 2024-25 season) 08/08/2023   Flu Shot  07/07/2024   Medicare Annual Wellness Visit  06/08/2025   Pneumococcal Vaccine for age over 90  Completed   Hepatitis B Vaccine  Aged Out   HPV Vaccine  Aged Out   Meningitis B Vaccine  Aged Out    Advanced directives: (ACP Link)Information on Advanced Care Planning can be found at [employer] Advance Health Care Directives Advance Health Care Directives. http://guzman.com/   Advance Care Planning is important because it:  [x]  Makes sure you receive the medical care that is consistent with your values, goals, and preferences  [x]  It provides guidance to your family and loved ones and reduces their decisional burden about whether or not they are making the right decisions based on your wishes.  Follow the link provided in your after visit summary or read over the paperwork we have mailed to you to help you started getting your Advance Directives  in place. If you need assistance in completing these, please reach out to us  so that we can help you!  See attachments for Preventive Care and Fall Prevention Tips.

## 2024-06-08 NOTE — Progress Notes (Signed)
 Subjective:   Jesse [name]. is a 83 y.o. who presents for a Medicare Wellness preventive visit.  As a reminder, Annual Wellness Visits don't include a physical exam, and some assessments may be limited, especially if this visit is performed virtually. We may recommend an in-person follow-up visit with your provider if needed.  Visit Complete: Virtual I connected with  Jesse [name]. on 06/08/24 by a audio enabled telemedicine application and verified that I am speaking with the correct person using two identifiers.  Patient Location: Home  Provider Location: Home Office  I discussed the limitations of evaluation and management by telemedicine. The patient expressed understanding and agreed to proceed.  Vital Signs: Because this visit was a virtual/telehealth visit, some criteria may be missing or patient reported. Any vitals not documented were not able to be obtained and vitals that have been documented are patient reported.  VideoDeclined- This patient declined Librarian, academic. Therefore the visit was completed with audio only.  Persons Participating in Visit: Patient.  AWV Questionnaire: Yes: Patient Medicare AWV questionnaire was completed by the patient on 06/06/24; I have confirmed that all information answered by patient is correct and no changes since this date.  Cardiac Risk Factors include: advanced age (>34men, >26 women);male gender;dyslipidemia;hypertension     Objective:    Today's Vitals   06/08/24 0846  Weight: 232 lb (105.2 kg)  Height: 5' 11 (1.803 m)   Body mass index is 32.36 kg/m.     06/08/2024    8:55 AM 07/28/2022    1:08 PM 09/02/2020    7:05 PM 09/02/2020    6:14 PM 06/19/2020    2:45 PM 07/13/2018    7:15 PM 07/06/2018   10:07 AM  Advanced Directives  Does Patient Have a Medical Advance Directive? No Yes Yes Unable to assess, patient is non-responsive or altered mental status Yes Yes  Yes   Type of Advance  Directive  Living will;Healthcare Power of [employer] Power of [place];Living will  Healthcare Power of [place];Living will Living will Living will  Does patient want to make changes to medical advance directive?   No - Patient declined  No - Patient declined No - Patient declined  No - Patient declined   Copy of Healthcare Power of Attorney in Chart?   No - copy requested  No - copy requested    Would patient like information on creating a medical advance directive? Yes (MAU/Ambulatory/Procedural Areas - Information given)           Data saved with a previous flowsheet row definition    Current Medications (verified) Outpatient Encounter Medications as of 06/08/2024  Medication Sig   fexofenadine (ALLEGRA) 180 MG tablet Take 180 mg by mouth daily.   fluticasone  (FLONASE ) 50 MCG/ACT nasal spray Place 2 sprays into both nostrils daily as needed for allergies.   fluticasone  (FLONASE ) 50 MCG/ACT nasal spray Place 2 sprays into both nostrils daily.   Multiple Vitamins-Minerals (PRESERVISION AREDS 2 PO) Take 1 capsule by mouth in the morning and at bedtime.   omeprazole  (PRILOSEC) 20 MG capsule Take 20 mg by mouth daily before breakfast.    omeprazole  (PRILOSEC) 20 MG capsule Take 1 capsule (20 mg total) by mouth every morning 30 minutes before breakfast   potassium chloride  SA (KLOR-CON  M20) 20 MEQ tablet Take 1 tablet (20 mEq total) by mouth daily.   rosuvastatin  (CRESTOR ) 10 MG tablet Take 1 tablet (10 mg total) by mouth daily.  triamcinolone  ointment (KENALOG ) 0.5 % Apply topically to affected area 2 (two) times daily for 2 weeks as needed   valsartan -hydrochlorothiazide  (DIOVAN -HCT) 320-25 MG tablet Take 1 tablet by mouth daily.   valsartan -hydrochlorothiazide  (DIOVAN -HCT) 320-25 MG tablet Take 1 tablet by mouth daily.   verapamil  (CALAN -SR) 240 MG CR tablet Take 1 tablet (240 mg total) by mouth daily.   No facility-administered encounter medications on file as of 06/08/2024.     Allergies (verified) Penicillins   History: Past Medical History:  Diagnosis Date   Acute pulmonary embolism without acute cor pulmonale (HCC) 09/02/2020   Allergy ??   Arthritis    Cataract ??   Degenerative lumbar spinal stenosis    Fatty liver    GERD (gastroesophageal reflux disease)    Headache    Heart murmur    History of kidney stones    Hyperlipidemia    Hypertension    Mastodynia    Migraine without aura and without status migrainosus, not intractable    Neuritis or radiculitis due to rupture of lumbar intervertebral disc 07/15/2018   PE (pulmonary thromboembolism) (HCC)    Pre-diabetes    Radiculopathy of thoracolumbar region 07/15/2018   Radiculopathy of thoracolumbar region 07/15/2018   Past Surgical History:  Procedure Laterality Date   ANTERIOR LATERAL LUMBAR FUSION WITH PERCUTANEOUS SCREW 2 LEVEL N/A 07/13/2018   Procedure: ANTERIOR LATERAL LUMBAR FUSION L2-4;  Surgeon: Burnetta Aures, MD;  Location: [facility] OR;  Service: Orthopedics;  Laterality: N/A;  4 hrs   CHOLECYSTECTOMY  1999   GALLBLADDER SURGERY  2016   JOINT REPLACEMENT  2019   NECK SURGERY  1995   Ruptured disc repair   REVERSE SHOULDER ARTHROPLASTY Left 08/06/2022   Procedure: REVERSE SHOULDER ARTHROPLASTY;  Surgeon: Melita Drivers, MD;  Location: [facility];  Service: Orthopedics;  Laterality: Left;    SHOULDER SURGERY Left 2005   SHOULDER SURGERY Right 2016   SPINE SURGERY     THROAT SURGERY     stretching   Family History  Problem Relation Age of Onset   Hypertension Mother    Cancer Father    Heart disease Sister    Diabetes Brother    Kidney disease Brother    Obesity Brother    Kidney disease Sister    Migraines Neg Hx    Social History   Socioeconomic History   Marital status: Married    Spouse name: Dagoberto   Number of children: 4   Years of education: 12+   Highest education level: 12th grade  Occupational History   Occupation: Retired Visual merchandiser  Tobacco Use   Smoking  status: Former    Current packs/day: 0.00    Average packs/day: 1 pack/day for 10.0 years (10.0 ttl pk-yrs)    Types: Cigarettes, Pipe, Cigars    Start date: 1965    Quit date: 1980    Years since quitting: 45.5   Smokeless tobacco: Never   Tobacco comments:    quit at least 43 years ago   Vaping Use   Vaping status: Never Used  Substance and Sexual Activity   Alcohol use: No   Drug use: No   Sexual activity: Not Currently  Other Topics Concern   Not on file  Social History Narrative   Lives with wife   Caffeine use: Drinks decaf coffee and tea   Writes right-handed, eats and shaves left-handed   Social Drivers of Health   Financial Resource Strain: Low Risk  (06/06/2024)   Overall Financial Resource  Strain (CARDIA)    Difficulty of Paying Living Expenses: Not very hard  Food Insecurity: No Food Insecurity (06/06/2024)   Hunger Vital Sign    Worried About Running Out of Food in the Last Year: Never true    Ran Out of Food in the Last Year: Never true  Transportation Needs: No Transportation Needs (06/06/2024)   PRAPARE - Administrator, Civil Service (Medical): No    Lack of Transportation (Non-Medical): No  Physical Activity: Sufficiently Active (06/06/2024)   Exercise Vital Sign    Days of Exercise per Week: 5 days    Minutes of Exercise per Session: 30 min  Stress: No Stress Concern Present (06/06/2024)   [employer] of Occupational Health - Occupational Stress Questionnaire    Feeling of Stress: Not at all  Social Connections: Socially Integrated (06/06/2024)   Social Connection and Isolation Panel    Frequency of Communication with Friends and Family: More than three times a week    Frequency of Social Gatherings with Friends and Family: Three times a week    Attends Religious Services: More than 4 times per year    Active Member of Clubs or Organizations: Yes    Attends Engineer, structural: More than 4 times per year    Marital Status: Married     Tobacco Counseling Counseling given: Not Answered Tobacco comments: quit at least 43 years ago     Clinical Intake:  Pre-visit preparation completed: Yes  Pain : No/denies pain     Diabetes: No  Lab Results  Component Value Date   HGBA1C 6.1 (H) 01/17/2024   HGBA1C 6.1 (H) 07/16/2023   HGBA1C 5.7 (H) 09/24/2022     How often do you need to have someone help you when you read instructions, pamphlets, or other written materials from your doctor or pharmacy?: 1 - Never  Interpreter Needed?: No  Information entered by :: Charmaine Bloodgood LPN   Activities of Daily Living     06/06/2024   10:13 AM  In your present state of health, do you have any difficulty performing the following activities:  Hearing? 1  Vision? 0  Difficulty concentrating or making decisions? 0  Walking or climbing stairs? 0  Dressing or bathing? 0  Doing errands, shopping? 0  Preparing Food and eating ? N  Using the Toilet? N  In the past six months, have you accidently leaked urine? N  Do you have problems with loss of bowel control? N  Managing your Medications? N  Managing your Finances? N  Housekeeping or managing your Housekeeping? N    Patient Care Team: Sherre Clapper, MD as PCP - General (Family Medicine) Misenheimer, Evalene, MD as Consulting Physician (Unknown Physician Specialty) Melita Drivers, MD as Consulting Physician (Orthopedic Surgery) [facility] (Ophthalmology) Kendell Marcey ORN, MD as Referring Physician (Dermatology) Billy Greig HERO, AUD (Audiology)  I have updated your Care Teams any recent Medical Services you may have received from other providers in the past year.     Assessment:   This is a routine wellness examination for Stavros.  Hearing/Vision screen Hearing Screening - Comments:: Hearing loss; followed by audiology  Vision Screening - Comments:: Wears rx glasses - up to date with routine eye exams with [facility]    Goals Addressed              This Visit's Progress    Prevent falls   On track    06/02/2023 AWV Goal: Fall Prevention  Over the next year, patient will decrease their risk for falls by: Using assistive devices, such as a cane or walker, as needed Identifying fall risks within their home and correcting them by: Removing throw rugs Adding handrails to stairs or ramps Removing clutter and keeping a clear pathway throughout the home Increasing light, especially at night Adding shower handles/bars Raising toilet seat Identifying potential personal risk factors for falls: Medication side effects Incontinence/urgency Vestibular dysfunction Hearing loss Musculoskeletal disorders Neurological disorders Orthostatic hypotension         Depression Screen     06/08/2024    8:54 AM 07/20/2023   11:30 AM 06/02/2023    2:03 PM 05/06/2022    9:15 AM 01/06/2022    8:09 AM 11/04/2021    3:48 PM 04/01/2021    8:42 AM  PHQ 2/9 Scores  PHQ - 2 Score 0 0 0 0 0 0 0  PHQ- 9 Score  0         Fall Risk     06/06/2024   10:13 AM 07/20/2023   11:30 AM 05/31/2023   12:49 PM 09/24/2022    8:10 AM 06/17/2022   10:25 AM  Fall Risk   Falls in the past year? 0 0 0 1 1  Number falls in past yr: 0 0 0 0 0  Injury with Fall? 0 0 0 0 1  Risk for fall due to : No Fall Risks No Fall Risks No Fall Risks No Fall Risks No Fall Risks  Follow up Falls evaluation completed;Education provided;Falls prevention discussed Falls evaluation completed;Follow up appointment Falls evaluation completed Falls evaluation completed  Falls evaluation completed      Data saved with a previous flowsheet row definition    MEDICARE RISK AT HOME:  Medicare Risk at Home Any stairs in or around the home?: (Patient-Rptd) Yes If so, are there any without handrails?: (Patient-Rptd) No Home free of loose throw rugs in walkways, pet beds, electrical cords, etc?: Yes Adequate lighting in your home to reduce risk of falls?: (Patient-Rptd) Yes Life alert?:  (Patient-Rptd) No Use of a cane, walker or w/c?: (Patient-Rptd) No Grab bars in the bathroom?: (Patient-Rptd) Yes Shower chair or bench in shower?: (Patient-Rptd) Yes Elevated toilet seat or a handicapped toilet?: (Patient-Rptd) Yes  TIMED UP AND GO:  Was the test performed?  No  Cognitive Function: Declined/Normal: No cognitive concerns noted by patient or family. Patient alert, oriented, able to answer questions appropriately and recall recent events. No signs of memory loss or confusion.        06/02/2023    2:22 PM 05/06/2022    9:19 AM 06/19/2020    2:48 PM  6CIT Screen  What Year? 0 points 0 points 0 points  What month? 0 points 0 points 0 points  What time? 0 points 0 points 0 points  Count back from 20 0 points 0 points 0 points  Months in reverse 0 points 0 points 2 points  Repeat phrase 0 points 0 points 0 points  Total Score 0 points 0 points 2 points    Immunizations Immunization History  Administered Date(s) Administered   Fluad Quad(high Dose 65+) 10/01/2020, 09/09/2021, 09/24/2022   Influenza, High Dose Seasonal PF 10/20/2018, 10/06/2019   Influenza-Unspecified 10/26/2018, 09/06/2021   PFIZER Comirnaty(Gray Top)Covid-19 Tri-Sucrose Vaccine 04/01/2021   PFIZER(Purple Top)SARS-COV-2 Vaccination 12/22/2019, 01/15/2020   Pfizer Covid-19 Vaccine Bivalent Booster 31yrs & up 09/09/2021   Pneumococcal Conjugate-13 09/23/2015   Pneumococcal Polysaccharide-23 06/17/2009   Tdap 07/25/2013  Screening Tests Health Maintenance  Topic Date Due   Zoster Vaccines- Shingrix (1 of 2) Never done   DTaP/Tdap/Td (2 - Td or Tdap) 07/26/2023   COVID-19 Vaccine (5 - 2024-25 season) 08/08/2023   INFLUENZA VACCINE  07/07/2024   Medicare Annual Wellness (AWV)  06/08/2025   Pneumococcal Vaccine: 50+ Years  Completed   Hepatitis B Vaccines  Aged Out   HPV VACCINES  Aged Out   Meningococcal B Vaccine  Aged Out    Health Maintenance  Health Maintenance Due  Topic Date Due    Zoster Vaccines- Shingrix (1 of 2) Never done   DTaP/Tdap/Td (2 - Td or Tdap) 07/26/2023   COVID-19 Vaccine (5 - 2024-25 season) 08/08/2023    Additional Screening:  Vision Screening: Recommended annual ophthalmology exams for early detection of glaucoma and other disorders of the eye. Would you like a referral to an eye doctor? No    Dental Screening: Recommended annual dental exams for proper oral hygiene  Community Resource Referral / Chronic Care Management: CRR required this visit?  No   CCM required this visit?  No   Plan:    I have personally reviewed and noted the following in the patient's chart:   Medical and social history Use of alcohol, tobacco or illicit drugs  Current medications and supplements including opioid prescriptions. Patient is not currently taking opioid prescriptions. Functional ability and status Nutritional status Physical activity Advanced directives List of other physicians Hospitalizations, surgeries, and ER visits in previous 12 months Vitals Screenings to include cognitive, depression, and falls Referrals and appointments  In addition, I have reviewed and discussed with patient certain preventive protocols, quality metrics, and best practice recommendations. A written personalized care plan for preventive services as well as general preventive health recommendations were provided to patient.   Lavelle Pfeiffer [place], [place]   01/12/7973   After Visit Summary: (MyChart) Due to this being a telephonic visit, the after visit summary with patients personalized plan was offered to patient via MyChart   Notes: Nothing significant to report at this time.

## 2024-06-20 ENCOUNTER — Other Ambulatory Visit (HOSPITAL_BASED_OUTPATIENT_CLINIC_OR_DEPARTMENT_OTHER): Payer: Self-pay

## 2024-06-20 ENCOUNTER — Other Ambulatory Visit: Payer: Self-pay | Admitting: Family Medicine

## 2024-06-20 MED ORDER — POTASSIUM CHLORIDE CRYS ER 20 MEQ PO TBCR
20.0000 meq | EXTENDED_RELEASE_TABLET | Freq: Every day | ORAL | 1 refills | Status: DC
  Filled 2024-06-20: qty 90, 90d supply, fill #0

## 2024-06-20 MED ORDER — ROSUVASTATIN CALCIUM 10 MG PO TABS
10.0000 mg | ORAL_TABLET | Freq: Every day | ORAL | 1 refills | Status: DC
  Filled 2024-06-20: qty 90, 90d supply, fill #0
  Filled 2024-09-18: qty 90, 90d supply, fill #1

## 2024-07-01 DIAGNOSIS — D485 Neoplasm of uncertain behavior of skin: Secondary | ICD-10-CM | POA: Diagnosis not present

## 2024-07-01 DIAGNOSIS — D2239 Melanocytic nevi of other parts of face: Secondary | ICD-10-CM | POA: Diagnosis not present

## 2024-07-04 DIAGNOSIS — Z974 Presence of external hearing-aid: Secondary | ICD-10-CM | POA: Diagnosis not present

## 2024-07-04 DIAGNOSIS — H6591 Unspecified nonsuppurative otitis media, right ear: Secondary | ICD-10-CM | POA: Diagnosis not present

## 2024-07-04 DIAGNOSIS — H9191 Unspecified hearing loss, right ear: Secondary | ICD-10-CM | POA: Diagnosis not present

## 2024-07-04 DIAGNOSIS — H6991 Unspecified Eustachian tube disorder, right ear: Secondary | ICD-10-CM | POA: Diagnosis not present

## 2024-07-05 ENCOUNTER — Other Ambulatory Visit (HOSPITAL_BASED_OUTPATIENT_CLINIC_OR_DEPARTMENT_OTHER): Payer: Self-pay

## 2024-07-12 DIAGNOSIS — C44319 Basal cell carcinoma of skin of other parts of face: Secondary | ICD-10-CM | POA: Diagnosis not present

## 2024-07-17 ENCOUNTER — Other Ambulatory Visit: Payer: Self-pay | Admitting: Family Medicine

## 2024-07-17 DIAGNOSIS — I10 Essential (primary) hypertension: Secondary | ICD-10-CM

## 2024-07-18 ENCOUNTER — Other Ambulatory Visit (HOSPITAL_BASED_OUTPATIENT_CLINIC_OR_DEPARTMENT_OTHER): Payer: Self-pay

## 2024-07-18 MED ORDER — VALSARTAN-HYDROCHLOROTHIAZIDE 320-25 MG PO TABS
1.0000 | ORAL_TABLET | Freq: Every day | ORAL | 1 refills | Status: DC
  Filled 2024-07-18: qty 90, 90d supply, fill #0
  Filled 2024-10-17: qty 90, 90d supply, fill #1

## 2024-07-19 ENCOUNTER — Ambulatory Visit: Payer: PPO | Admitting: Family Medicine

## 2024-07-19 ENCOUNTER — Encounter: Payer: Self-pay | Admitting: Family Medicine

## 2024-07-19 ENCOUNTER — Other Ambulatory Visit (HOSPITAL_BASED_OUTPATIENT_CLINIC_OR_DEPARTMENT_OTHER): Payer: Self-pay

## 2024-07-19 ENCOUNTER — Ambulatory Visit: Payer: Self-pay | Admitting: Family Medicine

## 2024-07-19 VITALS — BP 112/70 | HR 66 | Temp 98.2°F | Ht 71.0 in | Wt 233.0 lb

## 2024-07-19 DIAGNOSIS — E66811 Obesity, class 1: Secondary | ICD-10-CM | POA: Diagnosis not present

## 2024-07-19 DIAGNOSIS — K219 Gastro-esophageal reflux disease without esophagitis: Secondary | ICD-10-CM

## 2024-07-19 DIAGNOSIS — Z6832 Body mass index (BMI) 32.0-32.9, adult: Secondary | ICD-10-CM | POA: Diagnosis not present

## 2024-07-19 DIAGNOSIS — E876 Hypokalemia: Secondary | ICD-10-CM

## 2024-07-19 DIAGNOSIS — E6609 Other obesity due to excess calories: Secondary | ICD-10-CM | POA: Diagnosis not present

## 2024-07-19 DIAGNOSIS — R7303 Prediabetes: Secondary | ICD-10-CM

## 2024-07-19 DIAGNOSIS — H6991 Unspecified Eustachian tube disorder, right ear: Secondary | ICD-10-CM

## 2024-07-19 DIAGNOSIS — I10 Essential (primary) hypertension: Secondary | ICD-10-CM

## 2024-07-19 DIAGNOSIS — E782 Mixed hyperlipidemia: Secondary | ICD-10-CM

## 2024-07-19 DIAGNOSIS — Z85828 Personal history of other malignant neoplasm of skin: Secondary | ICD-10-CM | POA: Diagnosis not present

## 2024-07-19 HISTORY — DX: Hypokalemia: E87.6

## 2024-07-19 HISTORY — DX: Unspecified eustachian tube disorder, right ear: H69.91

## 2024-07-19 LAB — POCT GLYCOSYLATED HEMOGLOBIN (HGB A1C): Hemoglobin A1C: 5.7 % — AB (ref 4.0–5.6)

## 2024-07-19 LAB — POCT LIPID PANEL
HDL: 21
TC: <100
TRG: 90

## 2024-07-19 MED ORDER — POTASSIUM CHLORIDE ER 10 MEQ PO CPCR
20.0000 meq | ORAL_CAPSULE | Freq: Every day | ORAL | 1 refills | Status: DC
  Filled 2024-07-19 (×2): qty 180, 90d supply, fill #0
  Filled 2024-10-09: qty 180, 90d supply, fill #1

## 2024-07-19 NOTE — Assessment & Plan Note (Signed)
 Successful MOHs per patient report..  Sutures due to be removed next week.

## 2024-07-19 NOTE — Assessment & Plan Note (Signed)
At goal.  Continue omeprazole 20 mg daily.

## 2024-07-19 NOTE — Assessment & Plan Note (Addendum)
 Well controlled.  No changes to medicines.  Continue Valsartan /HCT 320/25 daily. Verapamil  240 mg daily.   Continue to work on eating a healthy diet and exercise.

## 2024-07-19 NOTE — Assessment & Plan Note (Signed)
 Change potassium chloride  20 meq to potassium chloride  microK 10 meq 2 daily.

## 2024-07-19 NOTE — Assessment & Plan Note (Signed)
 Recommend continue to work on eating healthy diet and exercise.

## 2024-07-19 NOTE — Patient Instructions (Signed)
 VISIT SUMMARY:  You had a routine follow-up visit to discuss various health issues including your blood sugar levels, chronic pain, reflux symptoms, and recent surgery for skin cancer. Your A1c has improved, and your reflux is well-managed. We also discussed your ear issues and the discomfort with your potassium supplement.  YOUR PLAN:  BASAL CELL CARCINOMA OF FACE, POST-MOHS EXCISION: You recently had surgery to remove basal cell carcinoma from your face. -Your sutures will be removed next Wednesday.  EUSTACHIAN TUBE DYSFUNCTION, RIGHT EAR: You have intermittent ear tube blockage affecting your hearing. -Continue using Flonase  and oral antihistamines. -Consider tympanostomy tube placement if symptoms do not improve.  OSTEOARTHRITIS OF BACK AND JOINTS: You have chronic pain in your back and joints, which limits your physical activity. -Continue taking Tylenol  occasionally for severe pain. -Exercise is limited due to arthritis.  PREDIABETES: Your A1c has improved to 5.7, indicating good control of your blood sugar levels. -Continue with your current dietary habits and weight loss efforts.  HYPERLIPIDEMIA: Your cholesterol and triglycerides are well-controlled with rosuvastatin . -Continue taking rosuvastatin  as prescribed.  GASTROESOPHAGEAL REFLUX DISEASE (GERD): Your reflux symptoms are well-managed. -Continue taking omeprazole  20 mg daily.  ALLERGIC RHINITIS: You have allergies managed with Flonase  and Allegra. -Continue taking Flonase  and Allegra as prescribed.  HYPOKALEMIA: Your potassium level is within normal range but on the lower side. -Prescribe micro potassium capsules, 10 mEq, take two capsules daily. Discontinue potassium chloride  20 meq.

## 2024-07-19 NOTE — Assessment & Plan Note (Signed)
 Well-controlled.   Continue rosuvastatin 10 mg daily. Continue healthy diet and exercise.   Labs reviewed.

## 2024-07-19 NOTE — Progress Notes (Signed)
 Subjective:  Patient ID: Jesse [name]., male    DOB: 07-20-1941  Age: 83 y.o. MRN: [id number]  Chief Complaint  Patient presents with   Medical Management of Chronic Issues        Discussed the use of AI scribe software for clinical note transcription with the patient, who gave verbal consent to proceed.  History of Present Illness   Jesse [name]. is an 83 year old male who presents for a routine follow-up visit.  Glycemic control and dietary habits - A1c improved from 6.1 to 5.7, attributed to healthier eating and weight loss efforts - Weight is increasing despite dietary changes - Consumes three meals daily including fruits, vegetables, and protein - Limits junk food intake - Sweet tea consumption may be impeding weight loss  Chronic musculoskeletal pain and functional limitation - Chronic back and joint pain limits physical activity to walking short distances (e.g., to the mailbox) - Occasionally takes Tylenol  for severe pain, but finds it ineffective - Consistent generalized weakness  Gastroesophageal reflux symptoms - Takes omeprazole  20 mg daily for reflux, which is effective  Medication tolerability - Potassium pill is large and dissolves quickly, causing discomfort when swallowing  Post-surgical wound care - Recently underwent Mohs surgery for basal cell carcinoma on the face - Suture removal scheduled for next Wednesday  Eustachian tube dysfunction and hearing impairment - Intermittent ear tube blockage affecting hearing - Symptoms worsen with certain head or jaw positions - Unclear if Flonase  provides benefit for this issue  - on fexofenadine and simply saline.     Hypertension: He is taking Valsartan /HYDROCHLOROTHIAZIDE  320/25 mg daily and verapamil  240 mg at bedtime.    GERD: He takes Omeprazole  20 mg daily.    Hyperlipidemia:  Rosuvastatin  10 mg daily.  Healthy diet and exercise.         06/08/2024    8:54 AM 07/20/2023   11:30 AM 06/02/2023     2:03 PM 05/06/2022    9:15 AM 01/06/2022    8:09 AM  Depression screen PHQ 2/9  Decreased Interest 0 0 0 0 0  Down, Depressed, Hopeless 0 0 0 0 0  PHQ - 2 Score 0 0 0 0 0  Altered sleeping  0     Tired, decreased energy  0     Change in appetite  0     Feeling bad or failure about yourself   0     Trouble concentrating  0     Moving slowly or fidgety/restless  0     Suicidal thoughts  0     PHQ-9 Score  0     Difficult doing work/chores  Not difficult at all           06/06/2024   10:13 AM  Fall Risk   Falls in the past year? 0  Number falls in past yr: 0  Injury with Fall? 0  Risk for fall due to : No Fall Risks  Follow up Falls evaluation completed;Education provided;Falls prevention discussed    Patient Care Team: Sherre Clapper, MD as PCP - General (Family Medicine) Misenheimer, Evalene, MD as Consulting Physician (Unknown Physician Specialty) Melita Drivers, MD as Consulting Physician (Orthopedic Surgery) [facility] [place] ([facility]) Kendell Marcey ORN, MD as Referring Physician (Dermatology) Billy Greig HERO, AUD (Audiology)   Review of Systems  Constitutional:  Negative for chills, diaphoresis, fatigue and fever.  HENT:  Negative for congestion, ear pain and sore throat.   Respiratory:  Negative for cough  and shortness of breath.   Cardiovascular:  Negative for chest pain and leg swelling.  Gastrointestinal:  Negative for abdominal pain, constipation, diarrhea, nausea and vomiting.  Genitourinary:  Negative for dysuria and urgency.  Musculoskeletal:  Positive for back pain. Negative for arthralgias and myalgias.  Neurological:  Negative for dizziness and headaches.  Psychiatric/Behavioral:  Negative for dysphoric mood.     Current Outpatient Medications on File Prior to Visit  Medication Sig Dispense Refill   fexofenadine (ALLEGRA) 180 MG tablet Take 180 mg by mouth daily.     fluticasone  (FLONASE ) 50 MCG/ACT nasal spray Place 2 sprays into both nostrils  daily as needed for allergies.     fluticasone  (FLONASE ) 50 MCG/ACT nasal spray Place 2 sprays into both nostrils daily. 16 g 5   Multiple Vitamins-Minerals (PRESERVISION AREDS 2 PO) Take 1 capsule by mouth in the morning and at bedtime.     omeprazole  (PRILOSEC) 20 MG capsule Take 20 mg by mouth daily before breakfast.      omeprazole  (PRILOSEC) 20 MG capsule Take 1 capsule (20 mg total) by mouth every morning 30 minutes before breakfast 90 capsule 4   rosuvastatin  (CRESTOR ) 10 MG tablet Take 1 tablet (10 mg total) by mouth daily. 90 tablet 1   triamcinolone  ointment (KENALOG ) 0.5 % Apply topically to affected area 2 (two) times daily for 2 weeks as needed 45 g 2   valsartan -hydrochlorothiazide  (DIOVAN -HCT) 320-25 MG tablet Take 1 tablet by mouth daily. 90 tablet 1   valsartan -hydrochlorothiazide  (DIOVAN -HCT) 320-25 MG tablet Take 1 tablet by mouth daily. 90 tablet 1   verapamil  (CALAN -SR) 240 MG CR tablet Take 1 tablet (240 mg total) by mouth daily. 180 tablet 0   No current facility-administered medications on file prior to visit.   Past Medical History:  Diagnosis Date   Acute pulmonary embolism without acute cor pulmonale (HCC) 09/02/2020   Allergy ??   Arthritis    Cataract ??   Degenerative lumbar spinal stenosis    Fatty liver    GERD (gastroesophageal reflux disease)    Headache    Heart murmur    History of kidney stones    History of SCC (squamous cell carcinoma) of skin    Hyperlipidemia    Hypertension    Mastodynia    Migraine without aura and without status migrainosus, not intractable    Neuritis or radiculitis due to rupture of lumbar intervertebral disc 07/15/2018   PE (pulmonary thromboembolism) (HCC)    Pre-diabetes    Radiculopathy of thoracolumbar region 07/15/2018   Radiculopathy of thoracolumbar region 07/15/2018   Past Surgical History:  Procedure Laterality Date   ANTERIOR LATERAL LUMBAR FUSION WITH PERCUTANEOUS SCREW 2 LEVEL N/A 07/13/2018    Procedure: ANTERIOR LATERAL LUMBAR FUSION L2-4;  Surgeon: Burnetta Aures, MD;  Location: [facility] OR;  Service: Orthopedics;  Laterality: N/A;  4 hrs   CHOLECYSTECTOMY  1999   GALLBLADDER SURGERY  2016   JOINT REPLACEMENT  2019   NECK SURGERY  1995   Ruptured disc repair   REVERSE SHOULDER ARTHROPLASTY Left 08/06/2022   Procedure: REVERSE SHOULDER ARTHROPLASTY;  Surgeon: Melita Drivers, MD;  Location: [facility];  Service: Orthopedics;  Laterality: Left;    scc excision     left cheek   SHOULDER SURGERY Left 2005   SHOULDER SURGERY Right 2016   SPINE SURGERY     THROAT SURGERY     stretching    Family History  Problem Relation Age of Onset   Hypertension  Mother    Cancer Father    Heart disease Sister    Diabetes Brother    Kidney disease Brother    Obesity Brother    Kidney disease Sister    Migraines Neg Hx    Social History   Socioeconomic History   Marital status: Married    Spouse name: Dagoberto   Number of children: 4   Years of education: 12+   Highest education level: 12th grade  Occupational History   Occupation: Retired Visual merchandiser  Tobacco Use   Smoking status: Former    Current packs/day: 0.00    Average packs/day: 1 pack/day for 10.0 years (10.0 ttl pk-yrs)    Types: Cigarettes, Pipe, Cigars    Start date: 39    Quit date: 1980    Years since quitting: 45.6   Smokeless tobacco: Never   Tobacco comments:    quit at least 43 years ago   Vaping Use   Vaping status: Never Used  Substance and Sexual Activity   Alcohol use: No   Drug use: No   Sexual activity: Not Currently  Other Topics Concern   Not on file  Social History Narrative   Lives with wife   Caffeine use: Drinks decaf coffee and tea   Writes right-handed, eats and shaves left-handed   Social Drivers of Health   Financial Resource Strain: Low Risk  (06/06/2024)   Overall Financial Resource Strain (CARDIA)    Difficulty of Paying Living Expenses: Not very hard  Food Insecurity: No Food Insecurity  (06/06/2024)   Hunger Vital Sign    Worried About Running Out of Food in the Last Year: Never true    Ran Out of Food in the Last Year: Never true  Transportation Needs: No Transportation Needs (06/06/2024)   PRAPARE - Administrator, Civil Service (Medical): No    Lack of Transportation (Non-Medical): No  Physical Activity: Sufficiently Active (06/06/2024)   Exercise Vital Sign    Days of Exercise per Week: 5 days    Minutes of Exercise per Session: 30 min  Stress: No Stress Concern Present (06/06/2024)   [employer] of Occupational Health - Occupational Stress Questionnaire    Feeling of Stress: Not at all  Social Connections: Socially Integrated (06/06/2024)   Social Connection and Isolation Panel    Frequency of Communication with Friends and Family: More than three times a week    Frequency of Social Gatherings with Friends and Family: Three times a week    Attends Religious Services: More than 4 times per year    Active Member of Clubs or Organizations: Yes    Attends Banker Meetings: More than 4 times per year    Marital Status: Married    Objective:  BP 112/70   Pulse 66   Temp 98.2 F (36.8 C)   Ht 5' 11 (1.803 m)   Wt 233 lb (105.7 kg)   SpO2 95%   BMI 32.50 kg/m      07/19/2024    9:16 AM 06/08/2024    8:46 AM 01/19/2024   11:50 AM  BP/Weight  Systolic BP 112 -- 136  Diastolic BP 70 -- 68  Wt. (Lbs) 233 232 232  BMI 32.5 kg/m2 32.36 kg/m2 32.36 kg/m2    Physical Exam Vitals reviewed.  Constitutional:      Appearance: Normal appearance.  Neck:     Vascular: No carotid bruit.  Cardiovascular:     Rate and Rhythm: Normal rate and  regular rhythm.     Heart sounds: Normal heart sounds.  Pulmonary:     Effort: Pulmonary effort is normal.     Breath sounds: Normal breath sounds. No wheezing, rhonchi or rales.  Abdominal:     General: Bowel sounds are normal.     Palpations: Abdomen is soft.     Tenderness: There is no abdominal  tenderness.  Neurological:     Mental Status: He is alert and oriented to person, place, and time.  Psychiatric:        Mood and Affect: Mood normal.        Behavior: Behavior normal.         Lab Results  Component Value Date   WBC 4.8 01/17/2024   HGB 13.8 01/17/2024   HCT 41.5 01/17/2024   PLT 151 01/17/2024   GLUCOSE 92 01/17/2024   CHOL 95 (L) 01/17/2024   TRIG 114 01/17/2024   HDL 45 01/17/2024   LDLCALC 29 01/17/2024   ALT 24 01/17/2024   AST 25 01/17/2024   NA 145 (H) 01/17/2024   K 3.6 01/17/2024   CL 103 01/17/2024   CREATININE 1.09 01/17/2024   BUN 13 01/17/2024   CO2 23 01/17/2024   INR 1.6 (H) 09/02/2020   HGBA1C 5.7 (A) 07/19/2024      Assessment & Plan:  Prediabetes Assessment & Plan: Continue healthy diet and exercise.  Orders: -     POCT Lipid Panel  Essential hypertension Assessment & Plan: Well controlled.  No changes to medicines.  Continue Valsartan /HCT 320/25 daily. Verapamil  240 mg daily.   Continue to work on eating a healthy diet and exercise.     Mixed hyperlipidemia Assessment & Plan: Well-controlled.   Continue rosuvastatin  10 mg daily. Continue healthy diet and exercise.   Labs reviewed.  Orders: -     POCT glycosylated hemoglobin (Hb A1C)  Gastroesophageal reflux disease without esophagitis Assessment & Plan: At goal.  Continue omeprazole  20 mg daily.    Class 1 obesity due to excess calories with serious comorbidity and body mass index (BMI) of 32.0 to 32.9 in adult Assessment & Plan: Recommend continue to work on eating healthy diet and exercise.    History of basal cell carcinoma of skin Assessment & Plan: Successful MOHs per patient report..  Sutures due to be removed next week.    Dysfunction of right eustachian tube Assessment & Plan: Intermittent dysfunction managed with Flonase , nasal saline and oral antihistamines. Tympanostomy tube placement considered if symptoms  persist.    Hypokalemia Assessment & Plan: Change potassium chloride  20 meq to potassium chloride  microK 10 meq 2 daily.   Orders: -     Potassium Chloride  ER; Take 2 capsules (20 mEq total) by mouth daily.  Dispense: 180 capsule; Refill: 1    Meds ordered this encounter  Medications   potassium chloride  (MICRO-K ) 10 MEQ CR capsule    Sig: Take 2 capsules (20 mEq total) by mouth daily.    Dispense:  180 capsule    Refill:  1    Orders Placed This Encounter  Procedures   POCT glycosylated hemoglobin (Hb A1C)   POCT Lipid Panel     Follow-up: Return in about 6 months (around 01/19/2025).d  An After Visit Summary was printed and given to the patient.  Abigail Free, MD [facility] [phone]

## 2024-07-19 NOTE — Assessment & Plan Note (Signed)
 Intermittent dysfunction managed with Flonase , nasal saline and oral antihistamines. Tympanostomy tube placement considered if symptoms persist.

## 2024-07-19 NOTE — Assessment & Plan Note (Signed)
 Continue healthy diet and exercise.

## 2024-07-20 ENCOUNTER — Other Ambulatory Visit (HOSPITAL_BASED_OUTPATIENT_CLINIC_OR_DEPARTMENT_OTHER): Payer: Self-pay

## 2024-07-25 ENCOUNTER — Ambulatory Visit: Attending: Cardiology

## 2024-07-25 DIAGNOSIS — I350 Nonrheumatic aortic (valve) stenosis: Secondary | ICD-10-CM | POA: Diagnosis not present

## 2024-07-25 LAB — ECHOCARDIOGRAM COMPLETE
AR max vel: 1.27 cm2
AV Area VTI: 1.34 cm2
AV Area mean vel: 1.22 cm2
AV Mean grad: 17 mmHg
AV Peak grad: 30.4 mmHg
Ao pk vel: 2.76 m/s
Area-P 1/2: 3.21 cm2
S' Lateral: 3.3 cm

## 2024-07-26 ENCOUNTER — Ambulatory Visit: Payer: Self-pay | Admitting: Cardiology

## 2024-07-26 ENCOUNTER — Encounter: Payer: Self-pay | Admitting: Cardiology

## 2024-08-04 ENCOUNTER — Other Ambulatory Visit (HOSPITAL_BASED_OUTPATIENT_CLINIC_OR_DEPARTMENT_OTHER): Payer: Self-pay

## 2024-08-28 ENCOUNTER — Other Ambulatory Visit (HOSPITAL_BASED_OUTPATIENT_CLINIC_OR_DEPARTMENT_OTHER): Payer: Self-pay

## 2024-08-28 MED ORDER — CLINDAMYCIN HCL 150 MG PO CAPS
600.0000 mg | ORAL_CAPSULE | ORAL | 0 refills | Status: DC
  Filled 2024-08-28: qty 8, 2d supply, fill #0

## 2024-08-28 NOTE — Progress Notes (Unsigned)
 Cardiology Office Note:    Date:  08/30/2024   ID:  Jesse JONETTA Lennie Mickey., DOB [birth year], MRN [id number]  PCP:  Sherre Clapper, MD  Cardiologist:  Redell Leiter, MD    Referring MD: Sherre Clapper, MD    ASSESSMENT:    1. Nonrheumatic aortic valve stenosis   2. Essential hypertension   3. Mixed hyperlipidemia    PLAN:    In order of problems listed above:  Fortunately his aortic stenosis has not progressed we will continue his current antihypertensive medications avoiding direct vasodilators like hydralazine  and endocarditis prophylaxis for further dental extraction Well-controlled give me a list of blood pressures in 2 weeks off verapamil  continue thiazide diuretic ARB combination Continue his statin with aortic stenosis   Next appointment: 1 year following echocardiogram   Medication Adjustments/Labs and Tests Ordered: Current medicines are reviewed at length with the patient today.  Concerns regarding medicines are outlined above.  Orders Placed This Encounter  Procedures   EKG 12-Lead   ECHOCARDIOGRAM COMPLETE   No orders of the defined types were placed in this encounter.    History of Present Illness:    Jesse Woolverton. is a 83 y.o. male with a hx of  Barrett's esophagus hypertension hyperlipidemia prediabetes and aortic stenosis  last seen 07/23/2023.  He had an echocardiogram performed 07/26/2019 5 aortic stenosis fortunately remains mild peak and mean gradients were 30 and 17 mmHg.  Left ventricle showed normal systolic function EF 60 to 65% although GLS was diminished there is no hypertrophy of dilation right ventricular function and pulmonary artery pressure were normal.  Compliance with diet, lifestyle and medications: Yes  He is seen along with his wife today overall he is doing well and has not had chest pain shortness of breath or syncope.  Has had no mucosal bleeding with his aortic stenosis tolerates his lipid-lowering therapy without muscle pain or  weakness and takes oral antibiotic for dental procedure endocarditis prophylaxis He does note that he is physical ability is somewhat diminished and I suspect is due to resting bradycardia and I am going to stop his verapamil  he will do home blood pressures for 2 weeks and send a list to me through MyChart Plan repeat echocardiogram in 1 year for valve progression and see me afterwards. Past Medical History:  Diagnosis Date   Acute pulmonary embolism without acute cor pulmonale (HCC) 09/02/2020   Allergic rhinitis due to allergen 10/14/2021   Allergy ??   Arthritis    Cataract ??   Class 1 obesity due to excess calories with serious comorbidity and body mass index (BMI) of 32.0 to 32.9 in adult 06/20/2022   Degenerative lumbar spinal stenosis    Dysfunction of right eustachian tube 07/19/2024   Fatty liver    GERD (gastroesophageal reflux disease)    Headache    Heart murmur    History of kidney stones    History of SCC (squamous cell carcinoma) of skin    Hyperlipidemia    Hypertension    Hypokalemia 07/19/2024   Mastodynia    Migraine without aura and without status migrainosus, not intractable    Neuritis or radiculitis due to rupture of lumbar intervertebral disc 07/15/2018   Osteoarthritis of left glenohumeral joint 11/05/2021   PE (pulmonary thromboembolism) (HCC)    Pre-diabetes    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   fluticasone  (FLONASE ) 50 MCG/ACT nasal spray Place 2 sprays into both nostrils daily as needed for allergies.   Multiple Vitamins-Minerals (PRESERVISION AREDS 2 PO) Take 1  capsule by mouth in the morning and at bedtime.   omeprazole  (PRILOSEC) 20 MG capsule Take 20 mg by mouth daily before breakfast.    potassium chloride  (MICRO-K ) 10 MEQ CR capsule Take 2 capsules (20 mEq total) by mouth daily.   rosuvastatin  (CRESTOR ) 10 MG tablet Take 1 tablet (10 mg total) by mouth daily.   triamcinolone  ointment (KENALOG ) 0.5 % Apply topically to affected area 2 (two) times daily for 2 weeks as needed   valsartan -hydrochlorothiazide  (DIOVAN -HCT) 320-25 MG tablet Take 1 tablet by mouth daily.   [DISCONTINUED] verapamil  (CALAN -SR) 240 MG CR tablet Take 1 tablet (240 mg total) by mouth daily.      EKGs/Labs/Other Studies Reviewed:    The following studies were reviewed today:  Cardiac Studies & Procedures   ______________________________________________________________________________________________     ECHOCARDIOGRAM  ECHOCARDIOGRAM COMPLETE 07/25/2024  Narrative ECHOCARDIOGRAM REPORT    Patient Name:   Jesse Rosiak. Date of Exam: 07/25/2024 Medical Rec #:  [id number]           Height:       71.0 in Accession #:    [id number]          Weight:       233.0 lb Date of Birth:  16-Feb-1941           BSA:          2.250 m Patient Age:    83 years            BP:           112/70 mmHg Patient Gender: M                   HR:           66 bpm. Exam Location:  [facility]  Procedure: 2D Echo, Cardiac Doppler, Color Doppler and Strain Analysis (Both Spectral and Color Flow Doppler were utilized during procedure).  Indications:    Nonrheumatic aortic valve stenosis [I35.0 (ICD-10-CM)]  History:        Patient has prior history of Echocardiogram examinations, most recent 07/05/2023. Aortic Valve Disease; Risk Factors:Hypertension and Dyslipidemia.  Sonographer:    Charlie Jointer RDCS Referring Phys: [id number] Kilie Rund J Jaimee Corum  IMPRESSIONS   1. Left ventricular ejection fraction, by estimation, is 60 to 65%. The left ventricle has normal function. The left ventricle  has no regional wall motion abnormalities. Left ventricular diastolic parameters are consistent with Grade I diastolic dysfunction (impaired relaxation). The average left ventricular global longitudinal strain is -14.4 %. The global longitudinal strain is abnormal. 2. Right ventricular systolic function is normal. The right ventricular size is normal. There is normal pulmonary artery systolic pressure. 3. The mitral valve is normal in structure. No evidence of mitral valve regurgitation. No evidence of mitral stenosis. 4. The aortic valve was not well visualized. Aortic valve regurgitation is not visualized. Mild aortic valve stenosis. 5. The inferior vena cava is normal in size with greater than 50% respiratory variability, suggesting right atrial pressure of 3 mmHg.  FINDINGS Left Ventricle: Left ventricular ejection fraction, by estimation,  is 60 to 65%. The left ventricle has normal function. The left ventricle has no regional wall motion abnormalities. The average left ventricular global longitudinal strain is -14.4 %. Strain was performed and the global longitudinal strain is abnormal. The left ventricular internal cavity size was normal in size. There is no left ventricular hypertrophy. Left ventricular diastolic parameters are consistent with Grade I diastolic dysfunction (impaired relaxation).  Right Ventricle: The right ventricular size is normal. No increase in right ventricular wall thickness. Right ventricular systolic function is normal. There is normal pulmonary artery systolic pressure. The tricuspid regurgitant velocity is 2.46 m/s, and with an assumed right atrial pressure of 3 mmHg, the estimated right ventricular systolic pressure is 27.2 mmHg.  Left Atrium: Left atrial size was normal in size.  Right Atrium: Right atrial size was normal in size.  Pericardium: There is no evidence of pericardial effusion.  Mitral Valve: The mitral valve is normal in structure. No evidence of  mitral valve regurgitation. No evidence of mitral valve stenosis.  Tricuspid Valve: The tricuspid valve is normal in structure. Tricuspid valve regurgitation is not demonstrated. No evidence of tricuspid stenosis.  Aortic Valve: The aortic valve was not well visualized. Aortic valve regurgitation is not visualized. Mild aortic stenosis is present. Aortic valve mean gradient measures 17.0 mmHg. Aortic valve peak gradient measures 30.4 mmHg. Aortic valve area, by VTI measures 1.34 cm.  Pulmonic Valve: The pulmonic valve was normal in structure. Pulmonic valve regurgitation is not visualized. No evidence of pulmonic stenosis.  Aorta: The aortic root is normal in size and structure.  Venous: The inferior vena cava is normal in size with greater than 50% respiratory variability, suggesting right atrial pressure of 3 mmHg.  IAS/Shunts: No atrial level shunt detected by color flow Doppler.   LEFT VENTRICLE PLAX 2D LVIDd:         4.90 cm   Diastology LVIDs:         3.30 cm   LV e' medial:    6.31 cm/s LV PW:         1.00 cm   LV E/e' medial:  13.4 LV IVS:        1.20 cm   LV e' lateral:   8.59 cm/s LVOT diam:     2.20 cm   LV E/e' lateral: 9.8 LV SV:         81 LV SV Index:   36        2D Longitudinal Strain LVOT Area:     3.80 cm  2D Strain GLS Avg:     -14.4 %   RIGHT VENTRICLE             IVC RV Basal diam:  4.40 cm     IVC diam: 2.20 cm RV Mid diam:    3.60 cm RV S prime:     12.10 cm/s TAPSE (M-mode): 2.5 cm  LEFT ATRIUM             Index        RIGHT ATRIUM           Index LA diam:        3.10 cm 1.38 cm/m   RA Area:     17.00 cm LA Vol (A2C):   38.6 ml 17.16 ml/m  RA Volume:   40.00 ml  17.78 ml/m LA Vol (A4C):   45.7 ml 20.31 ml/m LA Biplane Vol: 43.0 ml 19.11 ml/m AORTIC VALVE AV Area (Vmax):    1.27  cm AV Area (Vmean):   1.22 cm AV Area (VTI):     1.34 cm AV Vmax:           275.80 cm/s AV Vmean:          193.200 cm/s AV VTI:            0.602 m AV Peak Grad:       30.4 mmHg AV Mean Grad:      17.0 mmHg LVOT Vmax:         91.94 cm/s LVOT Vmean:        62.160 cm/s LVOT VTI:          0.212 m LVOT/AV VTI ratio: 0.35  AORTA Ao Root diam: 3.10 cm Ao Asc diam:  3.40 cm Ao Desc diam: 2.20 cm  MITRAL VALVE                TRICUSPID VALVE MV Area (PHT): 3.21 cm     TR Peak grad:   24.2 mmHg MV Decel Time: 236 msec     TR Vmax:        246.00 cm/s MV E velocity: 84.35 cm/s MV A velocity: 105.50 cm/s  SHUNTS MV E/A ratio:  0.80         Systemic VTI:  0.21 m Systemic Diam: 2.20 cm  Jennifer Crape MD Electronically signed by Jennifer Crape MD Signature Date/Time: 07/25/2024/12:53:18 PM    Final          ______________________________________________________________________________________________      EKG Interpretation Date/Time:  Wednesday August 30 2024 11:24:12 EDT Ventricular Rate:  55 PR Interval:  240 QRS Duration:  138 QT Interval:  486 QTC Calculation: 464 R Axis:   -74  Text Interpretation: Sinus bradycardia with 1st degree A-V block Left bundle branch block When compared with ECG of 23-Jul-2023 08:31, Left bundle branch block is now Present Confirmed by Monetta Rogue ([id number]) on 08/30/2024 11:33:44 AM   Recent Labs: 01/17/2024: ALT 24; BUN 13; Creatinine, Ser 1.09; Hemoglobin 13.8; Platelets 151; Potassium 3.6; Sodium 145  Recent Lipid Panel    Component Value Date/Time   CHOL 95 (L) 01/17/2024 0816   TRIG 114 01/17/2024 0816   HDL 45 01/17/2024 0816   CHOLHDL 2.1 01/17/2024 0816   LDLCALC 29 01/17/2024 0816    Physical Exam:    VS:  BP 130/72   Pulse (!) 55   Ht 5' 11 (1.803 m)   Wt 230 lb 6.4 oz (104.5 kg)   SpO2 95%   BMI 32.13 kg/m     Wt Readings from Last 3 Encounters:  08/30/24 230 lb 6.4 oz (104.5 kg)  07/19/24 233 lb (105.7 kg)  06/08/24 232 lb (105.2 kg)     GEN:  Well nourished, well developed in no acute distress HEENT: Normal NECK: No JVD; No carotid bruits LYMPHATICS: No  lymphadenopathy CARDIAC: RRR, no murmurs, rubs, gallops RESPIRATORY:  Clear to auscultation without rales, wheezing or rhonchi  ABDOMEN: Soft, non-tender, non-distended MUSCULOSKELETAL:  No edema; No deformity  SKIN: Warm and dry NEUROLOGIC:  Alert and oriented x 3 PSYCHIATRIC:  Normal affect    Signed, Rogue Monetta, MD  08/30/2024 12:14 PM    [facility]

## 2024-08-29 DIAGNOSIS — T7840XA Allergy, unspecified, initial encounter: Secondary | ICD-10-CM | POA: Insufficient documentation

## 2024-08-29 DIAGNOSIS — Z87442 Personal history of urinary calculi: Secondary | ICD-10-CM | POA: Insufficient documentation

## 2024-08-29 DIAGNOSIS — R011 Cardiac murmur, unspecified: Secondary | ICD-10-CM | POA: Insufficient documentation

## 2024-08-29 DIAGNOSIS — M48061 Spinal stenosis, lumbar region without neurogenic claudication: Secondary | ICD-10-CM | POA: Insufficient documentation

## 2024-08-29 DIAGNOSIS — K760 Fatty (change of) liver, not elsewhere classified: Secondary | ICD-10-CM | POA: Insufficient documentation

## 2024-08-29 DIAGNOSIS — M1990 Unspecified osteoarthritis, unspecified site: Secondary | ICD-10-CM | POA: Insufficient documentation

## 2024-08-29 DIAGNOSIS — H269 Unspecified cataract: Secondary | ICD-10-CM | POA: Insufficient documentation

## 2024-08-30 ENCOUNTER — Encounter: Payer: Self-pay | Admitting: Cardiology

## 2024-08-30 ENCOUNTER — Ambulatory Visit: Attending: Cardiology | Admitting: Cardiology

## 2024-08-30 VITALS — BP 130/72 | HR 55 | Ht 71.0 in | Wt 230.4 lb

## 2024-08-30 DIAGNOSIS — I350 Nonrheumatic aortic (valve) stenosis: Secondary | ICD-10-CM

## 2024-08-30 DIAGNOSIS — I10 Essential (primary) hypertension: Secondary | ICD-10-CM | POA: Diagnosis not present

## 2024-08-30 DIAGNOSIS — E782 Mixed hyperlipidemia: Secondary | ICD-10-CM | POA: Diagnosis not present

## 2024-08-30 NOTE — Patient Instructions (Signed)
 Medication Instructions:  Your physician has recommended you make the following change in your medication:   STOP: Verapamil   *If you need a refill on your cardiac medications before your next appointment, please call your pharmacy*  Lab Work: None If you have labs (blood work) drawn today and your tests are completely normal, you will receive your results only by: MyChart Message (if you have MyChart) OR A paper copy in the mail If you have any lab test that is abnormal or we need to change your treatment, we will call you to review the results.  Testing/Procedures: Your physician has requested that you have an echocardiogram. Echocardiography is a painless test that uses sound waves to create images of your heart. It provides your doctor with information about the size and shape of your heart and how well your heart's chambers and valves are working. This procedure takes approximately one hour. There are no restrictions for this procedure. Please do NOT wear cologne, perfume, aftershave, or lotions (deodorant is allowed). Please arrive 15 minutes prior to your appointment time.  Please note: We ask at that you not bring children with you during ultrasound (echo/ vascular) testing. Due to room size and safety concerns, children are not allowed in the ultrasound rooms during exams. Our front office staff cannot provide observation of children in our lobby area while testing is being conducted. An adult accompanying a patient to their appointment will only be allowed in the ultrasound room at the discretion of the ultrasound technician under special circumstances. We apologize for any inconvenience.   Follow-Up: At [facility], you and your health needs are our priority.  As part of our continuing mission to provide you with exceptional heart care, our providers are all part of one team.  This team includes your primary Cardiologist (physician) and Advanced Practice Providers or APPs  (Physician Assistants and Nurse Practitioners) who all work together to provide you with the care you need, when you need it.  Your next appointment:   1 year(s)  Provider:   Redell Leiter, MD    We recommend signing up for the patient portal called MyChart.  Sign up information is provided on this After Visit Summary.  MyChart is used to connect with patients for Virtual Visits (Telemedicine).  Patients are able to view lab/test results, encounter notes, upcoming appointments, etc.  Non-urgent messages can be sent to your provider as well.   To learn more about what you can do with MyChart, go to [email or web address].   Other Instructions Check and record blood pressures for 2 weeks and then send a list to the office

## 2024-09-07 ENCOUNTER — Other Ambulatory Visit (HOSPITAL_BASED_OUTPATIENT_CLINIC_OR_DEPARTMENT_OTHER): Payer: Self-pay

## 2024-09-07 MED ORDER — OMEPRAZOLE 20 MG PO CPDR
20.0000 mg | DELAYED_RELEASE_CAPSULE | Freq: Every morning | ORAL | 0 refills | Status: DC
  Filled 2024-09-07: qty 90, 90d supply, fill #0

## 2024-09-08 ENCOUNTER — Other Ambulatory Visit (HOSPITAL_BASED_OUTPATIENT_CLINIC_OR_DEPARTMENT_OTHER): Payer: Self-pay

## 2024-09-08 MED ORDER — FLUTICASONE PROPIONATE 50 MCG/ACT NA SUSP
2.0000 | Freq: Every day | NASAL | 11 refills | Status: DC
  Filled 2024-09-08: qty 16, 30d supply, fill #0
  Filled 2024-10-09: qty 16, 30d supply, fill #1

## 2024-09-15 ENCOUNTER — Encounter: Payer: Self-pay | Admitting: Cardiology

## 2024-09-18 ENCOUNTER — Other Ambulatory Visit (HOSPITAL_BASED_OUTPATIENT_CLINIC_OR_DEPARTMENT_OTHER): Payer: Self-pay

## 2024-10-03 ENCOUNTER — Other Ambulatory Visit (HOSPITAL_BASED_OUTPATIENT_CLINIC_OR_DEPARTMENT_OTHER): Payer: Self-pay

## 2024-10-03 DIAGNOSIS — Z8669 Personal history of other diseases of the nervous system and sense organs: Secondary | ICD-10-CM | POA: Diagnosis not present

## 2024-10-03 DIAGNOSIS — H9191 Unspecified hearing loss, right ear: Secondary | ICD-10-CM | POA: Diagnosis not present

## 2024-10-03 DIAGNOSIS — C44319 Basal cell carcinoma of skin of other parts of face: Secondary | ICD-10-CM | POA: Diagnosis not present

## 2024-10-03 DIAGNOSIS — L304 Erythema intertrigo: Secondary | ICD-10-CM | POA: Diagnosis not present

## 2024-10-03 MED ORDER — TRIAMCINOLONE ACETONIDE 0.1 % EX CREA
TOPICAL_CREAM | Freq: Two times a day (BID) | CUTANEOUS | 1 refills | Status: AC
  Filled 2024-10-03: qty 30, 7d supply, fill #0
  Filled 2024-10-09: qty 30, 7d supply, fill #1

## 2024-10-09 ENCOUNTER — Other Ambulatory Visit (HOSPITAL_BASED_OUTPATIENT_CLINIC_OR_DEPARTMENT_OTHER): Payer: Self-pay

## 2024-10-12 ENCOUNTER — Other Ambulatory Visit (HOSPITAL_BASED_OUTPATIENT_CLINIC_OR_DEPARTMENT_OTHER): Payer: Self-pay

## 2024-10-12 MED ORDER — FLUZONE HIGH-DOSE 0.5 ML IM SUSY
0.5000 mL | PREFILLED_SYRINGE | Freq: Once | INTRAMUSCULAR | 0 refills | Status: AC
  Filled 2024-10-12: qty 0.5, 1d supply, fill #0

## 2024-10-16 ENCOUNTER — Ambulatory Visit: Payer: Self-pay

## 2024-10-16 ENCOUNTER — Emergency Department (HOSPITAL_BASED_OUTPATIENT_CLINIC_OR_DEPARTMENT_OTHER)
Admission: EM | Admit: 2024-10-16 | Discharge: 2024-10-16 | Disposition: A | Discharge status: Home / Self Care | Source: Ambulatory Visit | Attending: Emergency Medicine | Admitting: Emergency Medicine

## 2024-10-16 ENCOUNTER — Emergency Department (HOSPITAL_BASED_OUTPATIENT_CLINIC_OR_DEPARTMENT_OTHER)

## 2024-10-16 ENCOUNTER — Other Ambulatory Visit: Payer: Self-pay

## 2024-10-16 ENCOUNTER — Encounter (HOSPITAL_BASED_OUTPATIENT_CLINIC_OR_DEPARTMENT_OTHER): Payer: Self-pay | Admitting: Emergency Medicine

## 2024-10-16 DIAGNOSIS — R7989 Other specified abnormal findings of blood chemistry: Secondary | ICD-10-CM | POA: Diagnosis not present

## 2024-10-16 DIAGNOSIS — Z96612 Presence of left artificial shoulder joint: Secondary | ICD-10-CM | POA: Diagnosis not present

## 2024-10-16 DIAGNOSIS — R531 Weakness: Secondary | ICD-10-CM | POA: Diagnosis not present

## 2024-10-16 DIAGNOSIS — R509 Fever, unspecified: Secondary | ICD-10-CM | POA: Diagnosis not present

## 2024-10-16 DIAGNOSIS — Z0389 Encounter for observation for other suspected diseases and conditions ruled out: Secondary | ICD-10-CM | POA: Diagnosis not present

## 2024-10-16 DIAGNOSIS — R5383 Other fatigue: Secondary | ICD-10-CM | POA: Insufficient documentation

## 2024-10-16 DIAGNOSIS — Z79899 Other long term (current) drug therapy: Secondary | ICD-10-CM | POA: Insufficient documentation

## 2024-10-16 DIAGNOSIS — R42 Dizziness and giddiness: Secondary | ICD-10-CM | POA: Insufficient documentation

## 2024-10-16 DIAGNOSIS — I10 Essential (primary) hypertension: Secondary | ICD-10-CM | POA: Insufficient documentation

## 2024-10-16 LAB — URINALYSIS, W/ REFLEX TO CULTURE (INFECTION SUSPECTED)
Bacteria, UA: NONE SEEN
Bilirubin Urine: NEGATIVE
Glucose, UA: NEGATIVE mg/dL
Leukocytes,Ua: NEGATIVE
Nitrite: NEGATIVE
Protein, ur: 30 mg/dL — AB
Specific Gravity, Urine: 1.024 (ref 1.005–1.030)
pH: 6 (ref 5.0–8.0)

## 2024-10-16 LAB — COMPREHENSIVE METABOLIC PANEL WITH GFR
ALT: 31 U/L (ref 0–44)
AST: 37 U/L (ref 15–41)
Albumin: 4.1 g/dL (ref 3.5–5.0)
Alkaline Phosphatase: 70 U/L (ref 38–126)
Anion gap: 9 (ref 5–15)
BUN: 16 mg/dL (ref 8–23)
CO2: 29 mmol/L (ref 22–32)
Calcium: 9.9 mg/dL (ref 8.9–10.3)
Chloride: 99 mmol/L (ref 98–111)
Creatinine, Ser: 1.13 mg/dL (ref 0.61–1.24)
GFR, Estimated: >60 mL/min (ref >60)
Glucose, Bld: 129 mg/dL — ABNORMAL HIGH (ref 70–99)
Potassium: 3.6 mmol/L (ref 3.5–5.1)
Sodium: 138 mmol/L (ref 135–145)
Total Bilirubin: 0.8 mg/dL (ref 0.0–1.2)
Total Protein: 6.6 g/dL (ref 6.5–8.1)

## 2024-10-16 LAB — CK: Total CK: 169 U/L (ref 49–397)

## 2024-10-16 LAB — CBC WITH DIFFERENTIAL/PLATELET
Abs Immature Granulocytes: 0.03 K/uL (ref 0.00–0.07)
Basophils Absolute: 0 K/uL (ref 0.0–0.1)
Basophils Relative: 0 %
Eosinophils Absolute: 0 K/uL (ref 0.0–0.5)
Eosinophils Relative: 0 %
HCT: 42.5 % (ref 39.0–52.0)
Hemoglobin: 14.6 g/dL (ref 13.0–17.0)
Immature Granulocytes: 1 %
Lymphocytes Relative: 4 %
Lymphs Abs: 0.2 K/uL — ABNORMAL LOW (ref 0.7–4.0)
MCH: 29.6 pg (ref 26.0–34.0)
MCHC: 34.4 g/dL (ref 30.0–36.0)
MCV: 86.2 fL (ref 80.0–100.0)
Monocytes Absolute: 0.3 K/uL (ref 0.1–1.0)
Monocytes Relative: 6 %
Neutro Abs: 5.1 K/uL (ref 1.7–7.7)
Neutrophils Relative %: 89 %
Platelets: 99 K/uL — ABNORMAL LOW (ref 150–400)
RBC: 4.93 MIL/uL (ref 4.22–5.81)
RDW: 13.1 % (ref 11.5–15.5)
WBC: 5.7 K/uL (ref 4.0–10.5)
nRBC: 0 % (ref 0.0–0.2)

## 2024-10-16 LAB — TROPONIN T, HIGH SENSITIVITY
Troponin T High Sensitivity: 77 ng/L — ABNORMAL HIGH (ref 0–19)
Troponin T High Sensitivity: 60 ng/L — ABNORMAL HIGH (ref 0–19)

## 2024-10-16 LAB — PROTIME-INR
INR: 1 (ref 0.8–1.2)
Prothrombin Time: 14 s (ref 11.4–15.2)

## 2024-10-16 LAB — LACTIC ACID, PLASMA: Lactic Acid, Venous: 1.9 mmol/L (ref 0.5–1.9)

## 2024-10-16 LAB — RESP PANEL BY RT-PCR (RSV, FLU A&B, COVID)  RVPGX2
Influenza A by PCR: NEGATIVE
Influenza B by PCR: NEGATIVE
Resp Syncytial Virus by PCR: NEGATIVE
SARS Coronavirus 2 by RT PCR: NEGATIVE

## 2024-10-16 MED ORDER — LACTATED RINGERS IV BOLUS (SEPSIS)
500.0000 mL | Freq: Once | INTRAVENOUS | Status: AC
  Administered 2024-10-16: 500 mL via INTRAVENOUS

## 2024-10-16 MED ORDER — ACETAMINOPHEN 500 MG PO TABS
1000.0000 mg | ORAL_TABLET | Freq: Once | ORAL | Status: AC
  Administered 2024-10-16: 1000 mg via ORAL
  Filled 2024-10-16: qty 2

## 2024-10-16 NOTE — ED Provider Notes (Signed)
  Physical Exam  BP 122/67   Pulse 72   Temp 99.2 F (37.3 C) (Oral)   Resp 19   Wt 99.8 kg   SpO2 94%   BMI 30.68 kg/m   Physical Exam  Procedures  Procedures  ED Course / MDM   Clinical Course as of 10/16/24 1754  Mon Oct 16, 2024  1525 Assumed care from Dr Jerrol. 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 No CP or SOB. Some mild abdominal pain yesterday. Temp was 102 here. EKG with LBBB that has been present before. Initial troponin was 77.  COVID and flu negative.  Chest x-ray without pneumonia.  Awaiting UA and troponin. Suspected that it is a viral illness.  [RP]  1750 Patient reassessed.  Second troponin is downtrending to 60 from 77.  Patient denies any chest pain or shortness of breath so I have low concern for MI at this point in time and suspect this may be demand due to his recent viral illness and has aortic stenosis.  Urinalysis without evidence of UTI.  Says that his symptoms have totally resolved with IV fluids and has been ambulatory to the restroom and says that he feels much better.  Performed shared decision making regarding disposition and the patient is requesting to go home at this time with cardiology follow-up for his elevated troponin and PCP follow-up for thrombocytopenia.  Suspect that his symptoms are likely from either viral illness or prolonged reaction to his vaccine.  Return precautions discussed prior to discharge. [RP]    Clinical Course User Index [RP] Yolande Lamar BROCKS, MD   Medical Decision Making Amount and/or Complexity of Data Reviewed Labs: ordered. Radiology: ordered.  Risk OTC drugs.       Yolande Lamar BROCKS, MD 10/16/24 1754

## 2024-10-16 NOTE — Telephone Encounter (Signed)
 Pt is at the emergency department.

## 2024-10-16 NOTE — Discharge Instructions (Signed)
 You were seen for your weakness in the emergency department. It is likely due to your viral illness or the shot you had.   At home, please stay well hydrated.    Check your MyChart online for the results of any tests that had not resulted by the time you left the emergency department.   Follow-up with your primary doctor in 2-3 days regarding your visit.  Follow-up with your cardiologist about your elevated troponin.   Return immediately to the emergency department if you experience any of the following: Fainting, chest pain, shortness of breath, or any other concerning symptoms.    Thank you for visiting our Emergency Department. It was a pleasure taking care of you today.

## 2024-10-16 NOTE — ED Notes (Signed)
 DC paperwork given and verbally understood.

## 2024-10-16 NOTE — ED Triage Notes (Signed)
 Pt bib wheelchair by wife and daughter. Endorses weakness and dizziness. Reports flu vaccine last week. Reports fever last night. Wife reports pt slide to floor from bed today. Reports BLE weakness today. PT aox4, speech clear

## 2024-10-16 NOTE — ED Provider Notes (Signed)
 [place] [facility] AT [facility] Provider Note   CSN: [id number] Arrival date & time: 10/16/24  1015     Patient presents with: Dizziness   Jesse [name]. is a 83 y.o. male.    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  The patient had a flu vaccine last Thursday.  Since then he has had muscle aches, weakness, fatigue.  He denies any chest pain or shortness of breath.  No known fevers however he was febrile to 102 here.  Denies any urinary symptoms.  He had an episode of mild abdominal discomfort yesterday which has since resolved.  Denies any abdominal pain today.  Denies any cough or shortness of breath.  Earlier today, he felt profusely weak and lightheaded and had to be lowered to the ground after sliding to the floor from his bed.  He endorsed whole body weakness at that time.  He denies any focal weakness in the lower extremities.  He denies any acute back pain, endorses chronic back pain.  He denies any urinary or fecal incontinence.  He denies any neck rigidity or stiffness.  Prior to Admission medications   Medication Sig Start Date End Date Taking? Authorizing Provider  clindamycin  (CLEOCIN ) 150 MG capsule Take 4 capsules (600 mg total) by mouth 1 hour prior to dental appointment 08/28/24     fexofenadine (ALLEGRA) 180 MG tablet Take 180 mg by mouth daily.    [provider]  fluticasone  (FLONASE ) 50 MCG/ACT nasal spray Place 2 sprays into both nostrils daily as needed for allergies. 07/05/21   [provider]  fluticasone  (FLONASE ) 50 MCG/ACT nasal spray Place 2 sprays into both nostrils daily. 09/08/24     Multiple Vitamins-Minerals (PRESERVISION AREDS 2 PO) Take 1 capsule by mouth in the morning and at  bedtime.    [provider]  omeprazole  (PRILOSEC) 20 MG capsule Take 20 mg by mouth daily before breakfast.  09/21/16   [provider]  omeprazole  (PRILOSEC) 20 MG capsule Take 1 capsule (20 mg total) by mouth every morning 30 minutes before breakfast 09/07/24     potassium chloride  (MICRO-K ) 10 MEQ CR capsule Take 2 capsules (20 mEq total) by mouth daily. 07/19/24   CoxAbigail, MD  rosuvastatin  (CRESTOR ) 10 MG tablet Take 1 tablet (10 mg total) by mouth daily. 06/20/24   Sherre Abigail, MD  triamcinolone  cream (KENALOG ) 0.1 % Apply to affected area topically 2 (two) times daily for 7-10 days as needed 10/03/24   Kendell Marcey ORN, MD  triamcinolone  ointment (KENALOG ) 0.5 % Apply topically to affected area 2 (two) times daily for 2 weeks as needed 07/06/23     valsartan -hydrochlorothiazide  (DIOVAN -HCT) 320-25 MG tablet Take 1 tablet by mouth daily. 07/18/24   Sherre Abigail, MD    Allergies: Penicillins    Review of Systems  Neurological:  Positive for dizziness.  All other systems reviewed and are negative.   Updated Vital Signs BP 124/60   Pulse 70   Temp 99.2 F (37.3 C) (Oral)   Resp 17   Wt 99.8 kg   SpO2 95%   BMI 30.68 kg/m   Physical Exam Vitals and nursing note reviewed.  Constitutional:      General: He is not in acute distress.    Appearance: He is well-developed.  HENT:     Head: Normocephalic and atraumatic.  Eyes:     Conjunctiva/sclera: Conjunctivae normal.  Neck:     Comments: No meningismus Cardiovascular:     Rate and Rhythm: Normal rate and regular rhythm.     Heart sounds: No murmur heard. Pulmonary:     Effort: Pulmonary effort is normal. No respiratory distress.     Breath sounds: Normal breath sounds.  Abdominal:     Palpations: Abdomen is soft.     Tenderness: There is no abdominal tenderness.  Musculoskeletal:        General: No swelling.     Cervical back: Normal range of motion and neck supple. No rigidity or tenderness.   Skin:    General: Skin is warm and dry.     Capillary Refill: Capillary refill takes less than 2 seconds.  Neurological:     Mental Status: He is alert.     Comments: MENTAL STATUS EXAM:    Orientation: Alert and oriented to person, place and time.  Memory: Cooperative, follows commands well.  Language: Speech is clear and language is normal.   CRANIAL NERVES:    CN 2 (Optic): Visual fields intact to confrontation.  CN 3,4,6 (EOM): Pupils equal and reactive to light. Full extraocular eye movement without nystagmus.  CN 5 (Trigeminal): Facial sensation is normal, no weakness of masticatory muscles.  CN 7 (Facial): No facial weakness or asymmetry.  CN 8 (Auditory): Auditory acuity grossly normal.  CN 9,10 (Glossophar): The uvula is midline, the palate elevates symmetrically.  CN 11 (spinal access): Normal sternocleidomastoid and trapezius strength.  CN 12 (Hypoglossal): The tongue is midline. No atrophy or fasciculations.SABRA   MOTOR:  Muscle Strength: 5/5RUE, 5/5LUE, 5/5RLE, 5/5LLE.   COORDINATION:   No tremor.   SENSATION:   Intact to light touch all four extremities.  GAIT: Gait not assessed   Psychiatric:        Mood and Affect: Mood normal.     (all labs ordered are listed, but only abnormal results are displayed) Labs Reviewed  CBC WITH DIFFERENTIAL/PLATELET - Abnormal; Notable for the following components:      Result Value   Platelets 99 (*)    Lymphs Abs 0.2 (*)    All other components within normal limits  COMPREHENSIVE METABOLIC PANEL WITH GFR - Abnormal; Notable for the following components:   Glucose, Bld 129 (*)    All other components within normal limits  TROPONIN T, HIGH SENSITIVITY - Abnormal; Notable for the following components:   Troponin T High Sensitivity 77 (*)    All other components within normal limits  TROPONIN T, HIGH SENSITIVITY - Abnormal; Notable for the following components:   Troponin T High Sensitivity 60 (*)    All other components  within normal limits  CULTURE, BLOOD (ROUTINE X 2)  RESP PANEL BY RT-PCR (RSV, FLU A&B, COVID)  RVPGX2  CULTURE, BLOOD (ROUTINE X 2)  LACTIC ACID, PLASMA  PROTIME-INR  CK  URINALYSIS, W/ REFLEX TO CULTURE (INFECTION SUSPECTED)    EKG: EKG Interpretation Date/Time:  Monday October 16 2024 11:01:04 EST Ventricular Rate:  92 PR Interval:  177 QRS Duration:  138 QT Interval:  371 QTC Calculation: 459 R Axis:   -78  Text Interpretation: Sinus rhythm Left bundle branch block Compared to previous tracing rate has increased otherwise no signidicant change Confirmed by Jerrol Agent ([id number]) on 10/16/2024 11:34:17 AM  Radiology: [facility] Chest Port 1 View Result Date: 10/16/2024 CLINICAL DATA:  Questionable sepsis. EXAM:  PORTABLE CHEST 1 VIEW COMPARISON:  Chest radiograph dated 09/02/2020. FINDINGS: No focal consolidation, pleural effusion, pneumothorax. The cardiac silhouette is within normal limits. Degenerative changes of the spine. No acute osseous pathology. Total left shoulder arthroplasty. IMPRESSION: No active disease. Electronically Signed   By: Vanetta Chou M.D.   On: 10/16/2024 12:24     Procedures   Medications Ordered in the ED  lactated ringers  bolus 500 mL (0 mLs Intravenous Stopped 10/16/24 1218)  acetaminophen  (TYLENOL ) tablet 1,000 mg (1,000 mg Oral Given 10/16/24 1134)    Clinical Course as of 10/16/24 1550  Mon Oct 16, 2024  1525 Assumed care from Dr Jerrol. 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 No CP or SOB. Some mild abdominal pain yesterday. Temp was 102 here. EKG with LBBB that has been present before. Initial troponin was 77. Awaiting UA and troponin. Suspected that it is a viral illness.  [RP]    Clinical Course User Index [RP] Yolande Lamar BROCKS, MD                                 Medical Decision Making Amount and/or Complexity of Data Reviewed Labs:  ordered. Radiology: ordered.  Risk OTC drugs.     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  The patient had a flu vaccine last Thursday.  Since then he has had muscle aches, weakness, fatigue.  He denies any chest pain or shortness of breath.  No known fevers however he was febrile to 102 here.  Denies any urinary symptoms.  He had an episode of mild abdominal discomfort yesterday which has since resolved.  Denies any abdominal pain today.  Denies any cough or shortness of breath.  Earlier today, he felt profusely weak and lightheaded and had to be lowered to the ground after sliding to the floor from his bed.  He endorsed whole body weakness at that time.  He denies any focal weakness in the lower extremities.  He denies any acute back pain, endorses chronic back pain.  He denies any urinary or fecal incontinence.  He denies any neck rigidity or stiffness.  On arrival, the patient was afebrile, temperature 99.4, not tachycardic heart rate 94, not tachypneic RR 18, BP 127/56, saturating 91 to 95% on room air.  Patient on recheck was found to be febrile to 102F.  On exam the patient has no meningismus.  He has an intact neurologic exam, no sensory deficit, intact 5 out of 5 strength in all extremities.  Endorsing generalized fatigue, weakness, muscle aches and reported rigors in the setting of fever.  No clear focus of infectious etiology patient did receive a flu shot on Thursday which should be an inactivated virus.  Infectious workup initiated on patient arrival.  On exam the patient had a nontender abdomen and lungs that were clear to auscultation bilaterally.  He has no rash.  No evidence of cellulitis.  He has no meningismus.  He endorses chronic back pain, no acute back  pain.  Initial EKG revealed sinus rhythm, left bundle branch block present, ventricular rate 92, no significant changes compared to previous EKGs.  Chest x-ray: No acute cardiac or pulm abnormality  CBC without a leukocytosis or anemia, CMP unremarkable, CK normal in the setting of the patient's muscle aches, lactic acid normal, blood cultures collected and pending, COVID flu and RSV PCR testing collected and negative.  An initial cardiac troponin was elevated at 77 however the patient does have a history of aortic stenosis.  He denies any chest pain at this time.  He has had no episodes of syncope.  No shortness of breath, no cough.  His abdomen is nontender and he denies any active abdominal pain.  Do not see an indication for CT imaging at this time.  Plan at time of signout to follow-up repeat cardiac troponin in the setting of the patient's weakness, follow-up urinalysis given the patient's fever.  If negative and troponin is downtrending, likely plan for discharge.  Suspect likely viral etiology of the patient's presentation.  Signout given to Dr. Jakie at 1530, ultimate disposition pending results of remaining diagnostic testing and reassessment.     Final diagnoses:  Fever, unspecified fever cause  Weakness  Fatigue, unspecified type    ED Discharge Orders     None          Jerrol Agent, MD 10/16/24 1739

## 2024-10-16 NOTE — Telephone Encounter (Signed)
 Copied from CRM [id number]. Topic: Clinical - Red Word Triage >> Oct 16, 2024  8:13 AM Rosaria BRAVO wrote: Red Word that prompted transfer to Nurse Triage: pt's wife is distressed, pt is shaking terribly following flu shot last week. Reaction symptoms worsening. Shaking so bad that he is about to fall on his way to the bathroom.

## 2024-10-16 NOTE — Telephone Encounter (Signed)
Pt currently in the ER

## 2024-10-17 ENCOUNTER — Ambulatory Visit (INDEPENDENT_AMBULATORY_CARE_PROVIDER_SITE_OTHER): Admitting: Family Medicine

## 2024-10-17 ENCOUNTER — Other Ambulatory Visit (HOSPITAL_BASED_OUTPATIENT_CLINIC_OR_DEPARTMENT_OTHER): Payer: Self-pay

## 2024-10-17 VITALS — BP 130/70 | HR 74 | Temp 98.2°F | Ht 71.0 in | Wt 227.0 lb

## 2024-10-17 DIAGNOSIS — R5381 Other malaise: Secondary | ICD-10-CM

## 2024-10-17 DIAGNOSIS — R5383 Other fatigue: Secondary | ICD-10-CM | POA: Diagnosis not present

## 2024-10-17 DIAGNOSIS — R509 Fever, unspecified: Secondary | ICD-10-CM

## 2024-10-17 DIAGNOSIS — R7989 Other specified abnormal findings of blood chemistry: Secondary | ICD-10-CM

## 2024-10-17 MED ORDER — DOXYCYCLINE HYCLATE 100 MG PO TABS
100.0000 mg | ORAL_TABLET | Freq: Two times a day (BID) | ORAL | 0 refills | Status: DC
  Filled 2024-10-17: qty 20, 10d supply, fill #0

## 2024-10-17 MED ORDER — DOXYCYCLINE HYCLATE 100 MG PO TABS
100.0000 mg | ORAL_TABLET | Freq: Every day | ORAL | 0 refills | Status: DC
  Filled 2024-10-17: qty 2, 2d supply, fill #0

## 2024-10-17 NOTE — Progress Notes (Unsigned)
 Cardiology Office Note:    Date:  10/18/2024   ID:  Jesse [name]., DOB 22-Sep-1941, MRN [id number]  PCP:  Sherre Clapper, MD  Cardiologist:  Redell Leiter, MD   Referring MD: Jesse Lamar BROCKS, MD  ASSESSMENT:    1. Elevated troponin level   2. Nonrheumatic aortic valve stenosis   3. Essential hypertension   4. Mixed hyperlipidemia    PLAN:    In order of problems listed above:  After review of the chart finding of subclinical atherosclerosis on CT scans and discussion with the patient and his wife had an episode of near syncope that morning abnormal EKG with conduction delay I think he is best served undergoing an ischemia evaluation will set up cardiac CTA [facility] Stable aortic stenosis mild Well-controlled continue current treatment Continue his statin especially with subclinical atherosclerosis  Next appointment 3 months or sooner if he has high risk markers on a cardiac CTA   Medication Adjustments/Labs and Tests Ordered: Current medicines are reviewed at length with the patient today.  Concerns regarding medicines are outlined above.  Orders Placed This Encounter  Procedures   EKG 12-Lead   No orders of the defined types were placed in this encounter.      History of Present Illness:    Jesse [name]. is a 83 y.o. male who is being seen today for the evaluation of elevated high-sensitivity troponin with a noncardiac ED evaluation at the request of Jesse Lamar BROCKS, MD.  He has a history of aortic stenosis hypertension and hyperlipidemia.  His echocardiogram August this year showed mild aortic stenosis normal left ventricular size wall thickness ejection fraction although GLS was diminished.  His previous EKG 07/23/2023 showed sinus rhythm left axis deviation left anterior hemiblock and EKG from September 2025 showed nonspecific conduction delay with left axis deviation the EKG from yesterday showed a similar pattern.  He was seen in the  emergency room [facility] 10/16/2024 with complaints of abdominal pain fever rigors weakness and had a troponin drawn high-sensitivity is elevated 70 7 repeat had downtrended to 60 EKG had left bundle branch block he had no cardiovascular complaints was not felt to have acute cardiac injury or ACS and was discharged.  There is a note he also has thrombocytopenia and he has a history of previous pulmonary embolism not anticoagulated.  Further chart review shows a CTA pulmonary embolism 2021 reviewed coronary artery calcification and more recently 2023 abdominal CT showing aortic atherosclerosis.  He obviously has subclinical atherosclerosis.  This is a very complex case.  In general we do not screen people with a high sensitivity troponin however is very difficult to ignore 1 that was drawn. His wife is present she tell me he was very sick the day went to the hospital he had an episode of near syncope at home although she does not describe him having any hypotension He has no known history of CAD and he has not had chest pain or shortness of breath. Certainly he has fully recovered and is on doxycycline  for concern of [place] spotted fever  Past Medical History:  Diagnosis Date   Acute pulmonary embolism without acute cor pulmonale (HCC) 09/02/2020   Allergic rhinitis due to allergen 10/14/2021   Allergy ??   Arthritis    Cataract ??   Class 1 obesity due to excess calories with serious comorbidity and body mass index (BMI) of 32.0 to 32.9 in adult 06/20/2022   Degenerative lumbar  spinal stenosis    Dysfunction of right eustachian tube 07/19/2024   Fatty liver    GERD (gastroesophageal reflux disease)    Headache    Heart murmur    History of kidney stones    History of SCC (squamous cell carcinoma) of skin    Hyperlipidemia    Hypertension    Hypokalemia 07/19/2024   Mastodynia    Migraine without aura and without status migrainosus, not intractable    Neuritis or  radiculitis due to rupture of lumbar intervertebral disc 07/15/2018   Osteoarthritis of left glenohumeral joint 11/05/2021   PE (pulmonary thromboembolism) (HCC)    Pre-diabetes    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   Radiculopathy of thoracolumbar region 07/15/2018   Radiculopathy of thoracolumbar region 07/15/2018   S/P lumbar fusion 07/13/2018    Past Surgical History:  Procedure Laterality Date   ANTERIOR LATERAL LUMBAR FUSION WITH PERCUTANEOUS SCREW 2 LEVEL N/A 07/13/2018   Procedure: ANTERIOR LATERAL LUMBAR FUSION L2-4;  Surgeon: Burnetta Aures, MD;  Location: [facility] OR;  Service: Orthopedics;  Laterality: N/A;  4 hrs   CHOLECYSTECTOMY  1999   GALLBLADDER SURGERY  2016   JOINT REPLACEMENT  2019   NECK SURGERY  1995   Ruptured disc repair   REVERSE SHOULDER ARTHROPLASTY Left 08/06/2022   Procedure: REVERSE SHOULDER ARTHROPLASTY;  Surgeon: Melita Drivers, MD;  Location: [facility];  Service: Orthopedics;  Laterality: Left;    scc excision     left cheek   SHOULDER SURGERY Left 2005   SHOULDER SURGERY Right 2016   SPINE SURGERY     THROAT SURGERY     stretching    Current Medications: Current Meds  Medication Sig   clindamycin  (CLEOCIN ) 150 MG capsule Take 4 capsules (600 mg total) by mouth 1 hour prior to dental appointment   doxycycline  (VIBRA -TABS) 100 MG tablet Take 1 tablet (100 mg total) by mouth 2 (two) times daily.   fexofenadine (ALLEGRA) 180 MG tablet Take 180 mg by mouth daily.   fluticasone  (FLONASE ) 50 MCG/ACT nasal spray Place 2 sprays into both nostrils daily as needed for allergies.   Multiple Vitamins-Minerals (PRESERVISION AREDS 2 PO) Take 1 capsule by mouth in the morning and at bedtime.   omeprazole  (PRILOSEC) 20 MG capsule Take 1 capsule (20 mg total) by mouth every morning 30 minutes before breakfast   potassium chloride  (MICRO-K ) 10 MEQ CR capsule Take 2 capsules (20 mEq total) by mouth daily.   rosuvastatin  (CRESTOR ) 10 MG tablet  Take 1 tablet (10 mg total) by mouth daily.   triamcinolone  cream (KENALOG ) 0.1 % Apply to affected area topically 2 (two) times daily for 7-10 days as needed   triamcinolone  ointment (KENALOG ) 0.5 % Apply topically to affected area 2 (two) times daily for 2 weeks as needed   valsartan -hydrochlorothiazide  (DIOVAN -HCT) 320-25 MG tablet Take 1 tablet by mouth daily.     Allergies:   Penicillins   Social History   Socioeconomic History   Marital status: Married    Spouse name: Dagoberto   Number of children: 4   Years of education: 12+   Highest education level: 12th grade  Occupational History   Occupation: Retired Visual Merchandiser  Tobacco Use   Smoking status: Former    Current packs/day: 0.00    Average packs/day: 1 pack/day for 10.0 years (10.0 ttl pk-yrs)    Types: Cigarettes, Pipe, Cigars    Start date: 1965    Quit date: 1980    Years  since quitting: 45.8   Smokeless tobacco: Never   Tobacco comments:    quit at least 43 years ago   Vaping Use   Vaping status: Never Used  Substance and Sexual Activity   Alcohol use: No   Drug use: No   Sexual activity: Not Currently  Other Topics Concern   Not on file  Social History Narrative   Lives with wife   Caffeine use: Drinks decaf coffee and tea   Writes right-handed, eats and shaves left-handed   Social Drivers of Corporate Investment Banker Strain: Low Risk  (06/06/2024)   Overall Financial Resource Strain (CARDIA)    Difficulty of Paying Living Expenses: Not very hard  Food Insecurity: No Food Insecurity (06/06/2024)   Hunger Vital Sign    Worried About Running Out of Food in the Last Year: Never true    Ran Out of Food in the Last Year: Never true  Transportation Needs: No Transportation Needs (06/06/2024)   PRAPARE - Administrator, Civil Service (Medical): No    Lack of Transportation (Non-Medical): No  Physical Activity: Sufficiently Active (06/06/2024)   Exercise Vital Sign    Days of Exercise per Week: 5 days     Minutes of Exercise per Session: 30 min  Stress: No Stress Concern Present (06/06/2024)   [employer] of Occupational Health - Occupational Stress Questionnaire    Feeling of Stress: Not at all  Social Connections: Socially Integrated (06/06/2024)   Social Connection and Isolation Panel    Frequency of Communication with Friends and Family: More than three times a week    Frequency of Social Gatherings with Friends and Family: Three times a week    Attends Religious Services: More than 4 times per year    Active Member of Clubs or Organizations: Yes    Attends Engineer, Structural: More than 4 times per year    Marital Status: Married     Family History: The patient's family history includes Cancer in his father; Diabetes in his brother; Heart disease in his sister; Hypertension in his mother; Kidney disease in his brother and sister; Obesity in his brother. There is no history of Migraines.  ROS:   ROS Please see the history of present illness.     All other systems reviewed and are negative.  EKGs/Labs/Other Studies Reviewed:    The following studies were reviewed today:   Cardiac Studies & Procedures   ______________________________________________________________________________________________     ECHOCARDIOGRAM  ECHOCARDIOGRAM COMPLETE 07/25/2024  Narrative ECHOCARDIOGRAM REPORT    Patient Name:   Feliciano Wynter. Date of Exam: 07/25/2024 Medical Rec #:  [id number]           Height:       71.0 in Accession #:    [id number]          Weight:       233.0 lb Date of Birth:  03/27/1941           BSA:          2.250 m Patient Age:    83 years            BP:           112/70 mmHg Patient Gender: M                   HR:           66 bpm. Exam Location:  [facility]  Procedure: 2D Echo, Cardiac  Doppler, Color Doppler and Strain Analysis (Both Spectral and Color Flow Doppler were utilized during procedure).  Indications:    Nonrheumatic aortic valve stenosis  [I35.0 (ICD-10-CM)]  History:        Patient has prior history of Echocardiogram examinations, most recent 07/05/2023. Aortic Valve Disease; Risk Factors:Hypertension and Dyslipidemia.  Sonographer:    Charlie Jointer RDCS Referring Phys: [id number] Jak Haggar J Laquita Harlan  IMPRESSIONS   1. Left ventricular ejection fraction, by estimation, is 60 to 65%. The left ventricle has normal function. The left ventricle has no regional wall motion abnormalities. Left ventricular diastolic parameters are consistent with Grade I diastolic dysfunction (impaired relaxation). The average left ventricular global longitudinal strain is -14.4 %. The global longitudinal strain is abnormal. 2. Right ventricular systolic function is normal. The right ventricular size is normal. There is normal pulmonary artery systolic pressure. 3. The mitral valve is normal in structure. No evidence of mitral valve regurgitation. No evidence of mitral stenosis. 4. The aortic valve was not well visualized. Aortic valve regurgitation is not visualized. Mild aortic valve stenosis. 5. The inferior vena cava is normal in size with greater than 50% respiratory variability, suggesting right atrial pressure of 3 mmHg.  FINDINGS Left Ventricle: Left ventricular ejection fraction, by estimation, is 60 to 65%. The left ventricle has normal function. The left ventricle has no regional wall motion abnormalities. The average left ventricular global longitudinal strain is -14.4 %. Strain was performed and the global longitudinal strain is abnormal. The left ventricular internal cavity size was normal in size. There is no left ventricular hypertrophy. Left ventricular diastolic parameters are consistent with Grade I diastolic dysfunction (impaired relaxation).  Right Ventricle: The right ventricular size is normal. No increase in right ventricular wall thickness. Right ventricular systolic function is normal. There is normal pulmonary artery systolic  pressure. The tricuspid regurgitant velocity is 2.46 m/s, and with an assumed right atrial pressure of 3 mmHg, the estimated right ventricular systolic pressure is 27.2 mmHg.  Left Atrium: Left atrial size was normal in size.  Right Atrium: Right atrial size was normal in size.  Pericardium: There is no evidence of pericardial effusion.  Mitral Valve: The mitral valve is normal in structure. No evidence of mitral valve regurgitation. No evidence of mitral valve stenosis.  Tricuspid Valve: The tricuspid valve is normal in structure. Tricuspid valve regurgitation is not demonstrated. No evidence of tricuspid stenosis.  Aortic Valve: The aortic valve was not well visualized. Aortic valve regurgitation is not visualized. Mild aortic stenosis is present. Aortic valve mean gradient measures 17.0 mmHg. Aortic valve peak gradient measures 30.4 mmHg. Aortic valve area, by VTI measures 1.34 cm.  Pulmonic Valve: The pulmonic valve was normal in structure. Pulmonic valve regurgitation is not visualized. No evidence of pulmonic stenosis.  Aorta: The aortic root is normal in size and structure.  Venous: The inferior vena cava is normal in size with greater than 50% respiratory variability, suggesting right atrial pressure of 3 mmHg.  IAS/Shunts: No atrial level shunt detected by color flow Doppler.   LEFT VENTRICLE PLAX 2D LVIDd:         4.90 cm   Diastology LVIDs:         3.30 cm   LV e' medial:    6.31 cm/s LV PW:         1.00 cm   LV E/e' medial:  13.4 LV IVS:        1.20 cm   LV e' lateral:   8.59  cm/s LVOT diam:     2.20 cm   LV E/e' lateral: 9.8 LV SV:         81 LV SV Index:   36        2D Longitudinal Strain LVOT Area:     3.80 cm  2D Strain GLS Avg:     -14.4 %   RIGHT VENTRICLE             IVC RV Basal diam:  4.40 cm     IVC diam: 2.20 cm RV Mid diam:    3.60 cm RV S prime:     12.10 cm/s TAPSE (M-mode): 2.5 cm  LEFT ATRIUM             Index        RIGHT ATRIUM            Index LA diam:        3.10 cm 1.38 cm/m   RA Area:     17.00 cm LA Vol (A2C):   38.6 ml 17.16 ml/m  RA Volume:   40.00 ml  17.78 ml/m LA Vol (A4C):   45.7 ml 20.31 ml/m LA Biplane Vol: 43.0 ml 19.11 ml/m AORTIC VALVE AV Area (Vmax):    1.27 cm AV Area (Vmean):   1.22 cm AV Area (VTI):     1.34 cm AV Vmax:           275.80 cm/s AV Vmean:          193.200 cm/s AV VTI:            0.602 m AV Peak Grad:      30.4 mmHg AV Mean Grad:      17.0 mmHg LVOT Vmax:         91.94 cm/s LVOT Vmean:        62.160 cm/s LVOT VTI:          0.212 m LVOT/AV VTI ratio: 0.35  AORTA Ao Root diam: 3.10 cm Ao Asc diam:  3.40 cm Ao Desc diam: 2.20 cm  MITRAL VALVE                TRICUSPID VALVE MV Area (PHT): 3.21 cm     TR Peak grad:   24.2 mmHg MV Decel Time: 236 msec     TR Vmax:        246.00 cm/s MV E velocity: 84.35 cm/s MV A velocity: 105.50 cm/s  SHUNTS MV E/A ratio:  0.80         Systemic VTI:  0.21 m Systemic Diam: 2.20 cm  Jennifer Crape MD Electronically signed by Jennifer Crape MD Signature Date/Time: 07/25/2024/12:53:18 PM    Final          ______________________________________________________________________________________________     EKG from the ER showed conduction delay similar pattern  Recent Labs: 10/16/2024: ALT 31; BUN 16; Creatinine, Ser 1.13; Hemoglobin 14.6; Platelets 99; Potassium 3.6; Sodium 138  Recent Lipid Panel    Component Value Date/Time   CHOL 95 (L) 01/17/2024 0816   TRIG 114 01/17/2024 0816   HDL 45 01/17/2024 0816   CHOLHDL 2.1 01/17/2024 0816   LDLCALC 29 01/17/2024 0816    Physical Exam:    VS:  BP 130/76   Pulse 72   Ht 5' 11 (1.803 m)   Wt 224 lb 12.8 oz (102 kg)   SpO2 94%   BMI 31.35 kg/m     Wt Readings from Last 3 Encounters:  10/18/24 224 lb  12.8 oz (102 kg)  10/17/24 227 lb (103 kg)  10/16/24 220 lb (99.8 kg)     GEN:  Well nourished, well developed in no acute distress HEENT: Normal NECK: No JVD; No  carotid bruits LYMPHATICS: No lymphadenopathy CARDIAC: RRR, no murmurs, rubs, gallops RESPIRATORY:  Clear to auscultation without rales, wheezing or rhonchi  ABDOMEN: Soft, non-tender, non-distended MUSCULOSKELETAL:  No edema; No deformity  SKIN: Warm and dry NEUROLOGIC:  Alert and oriented x 3 PSYCHIATRIC:  Normal affect     Signed, Redell Leiter, MD  10/18/2024 11:12 AM    [facility]

## 2024-10-17 NOTE — Progress Notes (Signed)
 Subjective:  Patient ID: Jesse JONETTA Lennie Mickey., male    DOB: [birth year]  Age: 83 y.o. MRN: [id number]  Chief Complaint  Patient presents with   Follow-up    HPI: Discussed the use of AI scribe software for clinical note transcription with the patient, who gave verbal consent to proceed.  History of Present Illness Jesse [name]. is an 83 year old male with aortic stenosis who presents with shaking, fever, and weakness.  Constitutional symptoms - Severe shaking and weakness began Saturday, worsened by Monday morning - Shaking described as intense, unable to stop, likened to 'going fifty miles an hour' - Difficulty walking, required frequent lying down due to weakness - Fever up to 102F, possibly higher, onset Friday, intensified over weekend - Chills, night sweats, and generalized body aches - Extreme fatigue and weakness - No chest pain, sore throat, or shortness of breath  Gastrointestinal symptoms - Significantly decreased appetite; consumed only half a bowl of oatmeal for breakfast and half a sandwich for lunch - No food intake the previous day due to lack of appetite and mouth discomfort - Loose bowel movements, two or three episodes this morning - No bowel movements the previous day due to lack of oral intake  Recent immunization - Received influenza vaccine last Thursday - Onset of symptoms shortly after vaccination - No prior adverse reactions to influenza vaccine  Hospitalization and laboratory findings - Brief hospitalization for evaluation of symptoms - Initial cardiac troponin level elevated at 77 - Chest x-ray, EKG, blood count, and COVID/RSV testing normal or negative       06/08/2024    8:54 AM 07/20/2023   11:30 AM 06/02/2023    2:03 PM 05/06/2022    9:15 AM 01/06/2022    8:09 AM  Depression screen PHQ 2/9  Decreased Interest 0 0 0 0 0  Down, Depressed, Hopeless 0 0 0 0 0  PHQ - 2 Score 0 0 0 0 0  Altered sleeping  0     Tired, decreased energy  0      Change in appetite  0     Feeling bad or failure about yourself   0     Trouble concentrating  0     Moving slowly or fidgety/restless  0     Suicidal thoughts  0     PHQ-9 Score  0      Difficult doing work/chores  Not difficult at all        Data saved with a previous flowsheet row definition        06/06/2024   10:13 AM  Fall Risk   Falls in the past year? 0  Number falls in past yr: 0  Injury with Fall? 0  Risk for fall due to : No Fall Risks  Follow up Falls evaluation completed;Education provided;Falls prevention discussed    Patient Care Team: Sherre Clapper, MD as PCP - General (Family Medicine) Misenheimer, Evalene, MD as Consulting Physician (Unknown Physician Specialty) Melita Drivers, MD as Consulting Physician (Orthopedic Surgery) [facility] (Ophthalmology) Kendell Marcey ORN, MD as Referring Physician (Dermatology) Billy Greig HERO, AUD (Audiology)   Review of Systems  All other systems reviewed and are negative.   Current Outpatient Medications on File Prior to Visit  Medication Sig Dispense Refill   clindamycin  (CLEOCIN ) 150 MG capsule Take 4 capsules (600 mg total) by mouth 1 hour prior to dental appointment 8 capsule 0   fexofenadine (ALLEGRA) 180 MG tablet Take 180  mg by mouth daily.     fluticasone  (FLONASE ) 50 MCG/ACT nasal spray Place 2 sprays into both nostrils daily as needed for allergies.     Multiple Vitamins-Minerals (PRESERVISION AREDS 2 PO) Take 1 capsule by mouth in the morning and at bedtime.     omeprazole  (PRILOSEC) 20 MG capsule Take 1 capsule (20 mg total) by mouth every morning 30 minutes before breakfast 90 capsule 0   potassium chloride  (MICRO-K ) 10 MEQ CR capsule Take 2 capsules (20 mEq total) by mouth daily. 180 capsule 1   rosuvastatin  (CRESTOR ) 10 MG tablet Take 1 tablet (10 mg total) by mouth daily. 90 tablet 1   triamcinolone  cream (KENALOG ) 0.1 % Apply to affected area topically 2 (two) times daily for 7-10 days as needed 30  g 1   triamcinolone  ointment (KENALOG ) 0.5 % Apply topically to affected area 2 (two) times daily for 2 weeks as needed 45 g 2   valsartan -hydrochlorothiazide  (DIOVAN -HCT) 320-25 MG tablet Take 1 tablet by mouth daily. 90 tablet 1   No current facility-administered medications on file prior to visit.   Past Medical History:  Diagnosis Date   Acute pulmonary embolism without acute cor pulmonale (HCC) 09/02/2020   Allergic rhinitis due to allergen 10/14/2021   Allergy ??   Arthritis    Cataract ??   Class 1 obesity due to excess calories with serious comorbidity and body mass index (BMI) of 32.0 to 32.9 in adult 06/20/2022   Degenerative lumbar spinal stenosis    Dysfunction of right eustachian tube 07/19/2024   Fatty liver    GERD (gastroesophageal reflux disease)    Headache    Heart murmur    History of kidney stones    History of SCC (squamous cell carcinoma) of skin    Hyperlipidemia    Hypertension    Hypokalemia 07/19/2024   Mastodynia    Migraine without aura and without status migrainosus, not intractable    Neuritis or radiculitis due to rupture of lumbar intervertebral disc 07/15/2018   Osteoarthritis of left glenohumeral joint 11/05/2021   PE (pulmonary thromboembolism) (HCC)    Pre-diabetes    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   Radiculopathy of thoracolumbar region 07/15/2018   Radiculopathy of thoracolumbar region 07/15/2018   S/P lumbar fusion 07/13/2018   Past Surgical History:  Procedure Laterality Date   ANTERIOR LATERAL LUMBAR FUSION WITH PERCUTANEOUS SCREW 2 LEVEL N/A 07/13/2018   Procedure: ANTERIOR LATERAL LUMBAR FUSION L2-4;  Surgeon: Burnetta Aures, MD;  Location: [facility] OR;  Service: Orthopedics;  Laterality: N/A;  4 hrs   CHOLECYSTECTOMY  1999   GALLBLADDER SURGERY  2016   JOINT REPLACEMENT  2019   NECK SURGERY  1995   Ruptured disc repair   REVERSE SHOULDER ARTHROPLASTY Left 08/06/2022   Procedure: REVERSE SHOULDER ARTHROPLASTY;   Surgeon: Melita Drivers, MD;  Location: [facility];  Service: Orthopedics;  Laterality: Left;    scc excision     left cheek   SHOULDER SURGERY Left 2005   SHOULDER SURGERY Right 2016   SPINE SURGERY     THROAT SURGERY     stretching    Family History  Problem Relation Age of Onset   Hypertension Mother    Cancer Father    Heart disease Sister    Diabetes Brother    Kidney disease Brother    Obesity Brother    Kidney disease Sister    Migraines Neg Hx    Social History   Socioeconomic  History   Marital status: Married    Spouse name: Dagoberto   Number of children: 4   Years of education: 12+   Highest education level: 12th grade  Occupational History   Occupation: Retired Visual Merchandiser  Tobacco Use   Smoking status: Former    Current packs/day: 0.00    Average packs/day: 1 pack/day for 10.0 years (10.0 ttl pk-yrs)    Types: Cigarettes, Pipe, Cigars    Start date: 62    Quit date: 1980    Years since quitting: 45.9   Smokeless tobacco: Never   Tobacco comments:    quit at least 43 years ago   Vaping Use   Vaping status: Never Used  Substance and Sexual Activity   Alcohol use: No   Drug use: No   Sexual activity: Not Currently  Other Topics Concern   Not on file  Social History Narrative   Lives with wife   Caffeine use: Drinks decaf coffee and tea   Writes right-handed, eats and shaves left-handed   Social Drivers of Health   Financial Resource Strain: Low Risk  (06/06/2024)   Overall Financial Resource Strain (CARDIA)    Difficulty of Paying Living Expenses: Not very hard  Food Insecurity: No Food Insecurity (06/06/2024)   Hunger Vital Sign    Worried About Running Out of Food in the Last Year: Never true    Ran Out of Food in the Last Year: Never true  Transportation Needs: No Transportation Needs (06/06/2024)   PRAPARE - Administrator, Civil Service (Medical): No    Lack of Transportation (Non-Medical): No  Physical Activity: Sufficiently Active  (06/06/2024)   Exercise Vital Sign    Days of Exercise per Week: 5 days    Minutes of Exercise per Session: 30 min  Stress: No Stress Concern Present (06/06/2024)   Jesse [name] [employer] Occupational [employer] - Occupational Stress Questionnaire    Feeling of Stress: Not at all  Social Connections: Socially Integrated (06/06/2024)   Social Connection and Isolation Panel    Frequency of Communication with Friends and Family: More than three times a week    Frequency of Social Gatherings with Friends and Family: Three times a week    Attends Religious Services: More than 4 times per year    Active Member of Clubs or Organizations: Yes    Attends Banker Meetings: More than 4 times per year    Marital Status: Married    Objective:  BP 130/70   Pulse 74   Temp 98.2 F (36.8 C)   Ht 5' 11 (1.803 m)   Wt 227 lb (103 kg)   SpO2 97%   BMI 31.66 kg/m      10/18/2024   10:49 AM 10/17/2024    2:22 PM 10/16/2024    6:00 PM  BP/Weight  Systolic BP 130 130 120  Diastolic BP 76 70 62  Wt. (Lbs) 224.8 227   BMI 31.35 kg/m2 31.66 kg/m2     Physical Exam Constitutional:      Appearance: Normal appearance.  HENT:     Right Ear: Tympanic membrane, ear canal and external ear normal.     Left Ear: Tympanic membrane, ear canal and external ear normal.     Nose: Nose normal. No congestion or rhinorrhea.     Mouth/Throat:     Mouth: Mucous membranes are moist.     Pharynx: No oropharyngeal exudate or posterior oropharyngeal erythema.  Cardiovascular:     Rate  and Rhythm: Normal rate and regular rhythm.     Heart sounds: Normal heart sounds.  Pulmonary:     Effort: Pulmonary effort is normal. No respiratory distress.     Breath sounds: Normal breath sounds. No wheezing, rhonchi or rales.  Abdominal:     General: Bowel sounds are normal.     Palpations: Abdomen is soft.     Tenderness: There is no abdominal tenderness.  Lymphadenopathy:     Cervical: No cervical adenopathy.   Neurological:     Mental Status: He is alert and oriented to person, place, and time.  Psychiatric:        Mood and Affect: Mood normal.        Behavior: Behavior normal.         Lab Results  Component Value Date   WBC 5.7 10/16/2024   HGB 14.6 10/16/2024   HCT 42.5 10/16/2024   PLT 99 (L) 10/16/2024   GLUCOSE 117 (H) 10/18/2024   CHOL 95 (L) 01/17/2024   TRIG 114 01/17/2024   HDL 45 01/17/2024   LDLCALC 29 01/17/2024   ALT 31 10/16/2024   AST 37 10/16/2024   NA 139 10/18/2024   K 3.4 (L) 10/18/2024   CL 98 10/18/2024   CREATININE 1.01 10/18/2024   BUN 16 10/18/2024   CO2 25 10/18/2024   INR 1.0 10/16/2024   HGBA1C 5.7 (A) 07/19/2024    Results for orders placed or performed during the hospital encounter of 10/16/24  Resp panel by RT-PCR (RSV, Flu A&B, Covid) Anterior Nasal Swab   Collection Time: 10/16/24 10:57 AM   Specimen: Anterior Nasal Swab  Result Value Ref Range   SARS Coronavirus 2 by RT PCR NEGATIVE NEGATIVE   Influenza A by PCR NEGATIVE NEGATIVE   Influenza B by PCR NEGATIVE NEGATIVE   Resp Syncytial Virus by PCR NEGATIVE NEGATIVE  Blood Culture (routine x 2)   Collection Time: 10/16/24 11:02 AM   Specimen: BLOOD  Result Value Ref Range   Specimen Description      BLOOD LEFT ANTECUBITAL Performed at [facility], [address], [place], [place] [address]    Special Requests      BOTTLES DRAWN AEROBIC AND ANAEROBIC Blood Culture adequate volume Performed at [facility], [address], [place], [place] [address]    Culture  Setup Time      GRAM POSITIVE RODS ANAEROBIC BOTTLE ONLY CRITICAL RESULT CALLED TO, READ BACK BY AND VERIFIED WITH: CHARGE NURSE S MAYNARD 10/20/2024 @ 0335 BY AB    Culture      GRAM POSITIVE RODS CULTURE REINCUBATED FOR BETTER GROWTH Performed at [facility], [address] [place], [place] [address]    Report Status PENDING   Lactic acid, plasma   Collection Time:  10/16/24 11:02 AM  Result Value Ref Range   Lactic Acid, Venous 1.9 0.5 - 1.9 mmol/L  CBC with Differential   Collection Time: 10/16/24 11:02 AM  Result Value Ref Range   WBC 5.7 4.0 - 10.5 K/uL   RBC 4.93 4.22 - 5.81 MIL/uL   Hemoglobin 14.6 13.0 - 17.0 g/dL   HCT 57.4 60.9 - 47.9 %   MCV 86.2 80.0 - 100.0 fL   MCH 29.6 26.0 - 34.0 pg   MCHC 34.4 30.0 - 36.0 g/dL   RDW 86.8 88.4 - 84.4 %   Platelets 99 (L) 150 - 400 K/uL   nRBC 0.0 0.0 - 0.2 %   Neutrophils Relative %  89 %   Neutro Abs 5.1 1.7 - 7.7 K/uL   Lymphocytes Relative 4 %   Lymphs Abs 0.2 (L) 0.7 - 4.0 K/uL   Monocytes Relative 6 %   Monocytes Absolute 0.3 0.1 - 1.0 K/uL   Eosinophils Relative 0 %   Eosinophils Absolute 0.0 0.0 - 0.5 K/uL   Basophils Relative 0 %   Basophils Absolute 0.0 0.0 - 0.1 K/uL   Immature Granulocytes 1 %   Abs Immature Granulocytes 0.03 0.00 - 0.07 K/uL  Blood Culture (routine x 2)   Collection Time: 10/16/24 11:45 AM   Specimen: BLOOD RIGHT WRIST  Result Value Ref Range   Specimen Description      BLOOD RIGHT WRIST Performed at [facility], [address] [place], [place] [address]    Special Requests      BOTTLES DRAWN AEROBIC AND ANAEROBIC Blood Culture adequate volume Performed at [facility], [address], [place], [place] [address]    Culture      NO GROWTH 5 DAYS Performed at [facility], [address], [place], [place] [address]    Report Status 10/21/2024 FINAL   Comprehensive metabolic panel with GFR   Collection Time: 10/16/24 12:15 PM  Result Value Ref Range   Sodium 138 135 - 145 mmol/L   Potassium 3.6 3.5 - 5.1 mmol/L   Chloride 99 98 - 111 mmol/L   CO2 29 22 - 32 mmol/L   Glucose, Bld 129 (H) 70 - 99 mg/dL   BUN 16 8 - 23 mg/dL   Creatinine, Ser 8.86 0.61 - 1.24 mg/dL   Calcium  9.9 8.9 - 10.3 mg/dL   Total Protein 6.6 6.5 - 8.1 g/dL   Albumin  4.1 3.5 - 5.0 g/dL   AST 37 15 - 41 U/L   ALT 31 0 - 44 U/L   Alkaline  Phosphatase 70 38 - 126 U/L   Total Bilirubin 0.8 0.0 - 1.2 mg/dL   GFR, Estimated >39 >39 mL/min   Anion gap 9 5 - 15  Protime-INR   Collection Time: 10/16/24 12:15 PM  Result Value Ref Range   Prothrombin Time 14.0 11.4 - 15.2 seconds   INR 1.0 0.8 - 1.2  CK   Collection Time: 10/16/24 12:15 PM  Result Value Ref Range   Total CK 169 49 - 397 U/L  Troponin T, High Sensitivity   Collection Time: 10/16/24 12:15 PM  Result Value Ref Range   Troponin T High Sensitivity 77 (H) 0 - 19 ng/L  Troponin T, High Sensitivity   Collection Time: 10/16/24  3:00 PM  Result Value Ref Range   Troponin T High Sensitivity 60 (H) 0 - 19 ng/L  Urinalysis, w/ Reflex to Culture (Infection Suspected) -Urine, Clean Catch   Collection Time: 10/16/24  5:18 PM  Result Value Ref Range   Specimen Source URINE, CLEAN CATCH    Color, Urine YELLOW YELLOW   APPearance CLEAR CLEAR   Specific Gravity, Urine 1.024 1.005 - 1.030   pH 6.0 5.0 - 8.0   Glucose, UA NEGATIVE NEGATIVE mg/dL   Hgb urine dipstick TRACE (A) NEGATIVE   Bilirubin Urine NEGATIVE NEGATIVE   Ketones, ur TRACE (A) NEGATIVE mg/dL   Protein, ur 30 (A) NEGATIVE mg/dL   Nitrite NEGATIVE NEGATIVE   Leukocytes,Ua NEGATIVE NEGATIVE   RBC / HPF 6-10 0 - 5 RBC/hpf   WBC, UA 6-10 0 - 5 WBC/hpf   Bacteria, UA NONE SEEN NONE SEEN  Squamous Epithelial / HPF 0-5 0 - 5 /HPF   Mucus PRESENT    Hyaline Casts, UA PRESENT   .  Assessment & Plan:   Assessment & Plan Fever, unspecified fever cause Acute febrile illness with myalgias and weakness since Saturday. Symptoms include shaking, chills, sweating, and weakness. COVID and RSV tests negative. Possible relation to recent flu vaccination. Check for RMSF and Lyme disease.  Treat presumptively with doxycycline .  Orders:   doxycycline  (VIBRA -TABS) 100 MG tablet; Take 1 tablet (100 mg total) by mouth 2 (two) times daily.   Lyme Disease Serology w/Reflex   Spotted Fever Group Antibodies  Elevated  troponin Likely false positive due to valvular disease in the setting of no chest pain and troponin trending down.     Malaise and fatigue Likely viral illness.       Body mass index is 31.66 kg/m.   Meds ordered this encounter  Medications   DISCONTD: doxycycline  (VIBRA -TABS) 100 MG tablet    Sig: Take 1 tablet (100 mg total) by mouth daily.    Dispense:  2 tablet    Refill:  0   doxycycline  (VIBRA -TABS) 100 MG tablet    Sig: Take 1 tablet (100 mg total) by mouth 2 (two) times daily.    Dispense:  20 tablet    Refill:  0    Orders Placed This Encounter  Procedures   Lyme Disease Serology w/Reflex   Spotted Fever Group Antibodies       Follow-up: Return if symptoms worsen or fail to improve.  An After Visit Summary was printed and given to the patient.  Abigail Free, MD [facility] [phone]

## 2024-10-17 NOTE — Patient Instructions (Addendum)
  VISIT SUMMARY: You came in today because you have been experiencing severe shaking, fever, and weakness since Saturday. You also reported chills, night sweats, and body aches. You recently received a flu vaccine, and your symptoms started shortly after. Your initial tests showed an elevated cardiac troponin level, but further evaluation is needed.  YOUR PLAN: ACUTE FEBRILE ILLNESS WITH MYALGIAS AND WEAKNESS: You have been experiencing fever, shaking, chills, sweating, and weakness since Saturday. Your COVID and RSV tests were negative, and your symptoms may be related to the recent flu vaccination. -Monitor your symptoms closely.  - Checking for [place] spotted fever and lyme disease.  - doxycycline  100 mg 2 pills x 1.                     Contains text generated by Abridge.                                 Contains text generated by Abridge.

## 2024-10-18 ENCOUNTER — Ambulatory Visit: Attending: Cardiology | Admitting: Cardiology

## 2024-10-18 ENCOUNTER — Other Ambulatory Visit (HOSPITAL_BASED_OUTPATIENT_CLINIC_OR_DEPARTMENT_OTHER): Payer: Self-pay

## 2024-10-18 ENCOUNTER — Other Ambulatory Visit (HOSPITAL_COMMUNITY): Payer: Self-pay

## 2024-10-18 VITALS — BP 130/76 | HR 72 | Ht 71.0 in | Wt 224.8 lb

## 2024-10-18 DIAGNOSIS — I10 Essential (primary) hypertension: Secondary | ICD-10-CM

## 2024-10-18 DIAGNOSIS — I350 Nonrheumatic aortic (valve) stenosis: Secondary | ICD-10-CM

## 2024-10-18 DIAGNOSIS — R7989 Other specified abnormal findings of blood chemistry: Secondary | ICD-10-CM

## 2024-10-18 DIAGNOSIS — E782 Mixed hyperlipidemia: Secondary | ICD-10-CM

## 2024-10-18 MED ORDER — METOPROLOL TARTRATE 100 MG PO TABS
100.0000 mg | ORAL_TABLET | Freq: Once | ORAL | 0 refills | Status: AC
  Filled 2024-10-18: qty 1, 1d supply, fill #0

## 2024-10-18 NOTE — Patient Instructions (Signed)
 Medication Instructions:  Your physician recommends that you continue on your current medications as directed. Please refer to the Current Medication list given to you today.  *If you need a refill on your cardiac medications before your next appointment, please call your pharmacy*  Lab Work: Your physician recommends that you return for lab work in:   Labs today: BMP  If you have labs (blood work) drawn today and your tests are completely normal, you will receive your results only by: MyChart Message (if you have MyChart) OR A paper copy in the mail If you have any lab test that is abnormal or we need to change your treatment, we will call you to review the results.  Testing/Procedures:   Your cardiac CT will be scheduled at one of the below locations:   [facility] [address] [place], [place] [address] [phone] (Severe contrast allergies only)  OR   [facility] [address] [place], [place] [address] [phone]  OR   [facility] [address] [place], [place] [address] [phone]  OR   [facility] and Vascular Tower [address]  [place], [place] [address]  OR   [facility] [address] [place], [place] [phone]  If scheduled at [facility], please arrive at the [facility] and Children's Entrance (Entrance C2) of [facility] 30 minutes prior to test start time. You can use the FREE valet parking offered at entrance C (encouraged to control the heart rate for the test)  Proceed to the [facility] Radiology Department (first floor) to check-in and test prep.  All radiology patients and guests should use entrance C2 at [facility], accessed from [facility], even though the hospital's physical address listed is [address]  If scheduled at the Heart and Vascular Tower at [address], please enter the parking lot using the  [address] entrance and use the FREE valet service at the patient drop-off area. Enter the building and check-in with registration on the main floor.  If scheduled at [facility], please arrive to the Heart and Vascular Center 15 mins early for check-in and test prep.  There is spacious parking and easy access to the radiology department from the [facility] Heart and Vascular entrance. Please enter here and check-in with the desk attendant.   If scheduled at [facility], please arrive 30 minutes early for check-in and test prep.  Please follow these instructions carefully (unless otherwise directed):  An IV will be required for this test and Nitroglycerin  will be given.  Hold all erectile dysfunction medications at least 3 days (72 hrs) prior to test. (Ie viagra, cialis, sildenafil, tadalafil, etc)   On the Night Before the Test: Be sure to Drink plenty of water. Do not consume any caffeinated/decaffeinated beverages or chocolate 12 hours prior to your test. Do not take any antihistamines 12 hours prior to your test.  On the Day of the Test: Drink plenty of water until 1 hour prior to the test. Do not eat any food 1 hour prior to test. You may take your regular medications prior to the test.  Take metoprolol (Lopressor) two hours prior to test. If you take Valsartan  - Hydrochlorothiazide  please HOLD on the morning of the test. Patients who wear a continuous glucose monitor MUST remove the device prior to scanning.      After the Test: Drink plenty of water.  After receiving IV contrast, you may experience a mild flushed feeling. This is normal. On occasion, you may experience a mild rash up to 24 hours after the test. This is not dangerous. If this occurs, you can take Benadryl  25 mg, Zyrtec, Claritin, or Allegra and increase your fluid intake. (Patients taking Tikosyn should avoid Benadryl , and may take Zyrtec, Claritin, or Allegra) If you experience trouble  breathing, this can be serious. If it is severe call 911 IMMEDIATELY. If it is mild, please call our office.  We will call to schedule your test 2-4 weeks out understanding that some insurance companies will need an authorization prior to the service being performed.   For more information and frequently asked questions, please visit our website : [email or web address]  For non-scheduling related questions, please contact the cardiac imaging nurse navigator should you have any questions/concerns: Cardiac Imaging Nurse Navigators Direct Office Dial: [phone]   For scheduling needs, including cancellations and rescheduling, please call Brittany, [phone].   Follow-Up: At [facility], you and your health needs are our priority.  As part of our continuing mission to provide you with exceptional heart care, our providers are all part of one team.  This team includes your primary Cardiologist (physician) and Advanced Practice Providers or APPs (Physician Assistants and Nurse Practitioners) who all work together to provide you with the care you need, when you need it.  Your next appointment:   3 month(s)  Provider:   Redell Leiter, MD    We recommend signing up for the patient portal called MyChart.  Sign up information is provided on this After Visit Summary.  MyChart is used to connect with patients for Virtual Visits (Telemedicine).  Patients are able to view lab/test results, encounter notes, upcoming appointments, etc.  Non-urgent messages can be sent to your provider as well.   To learn more about what you can do with MyChart, go to [email or web address].   Other Instructions None

## 2024-10-19 ENCOUNTER — Ambulatory Visit: Payer: Self-pay | Admitting: Cardiology

## 2024-10-19 LAB — BASIC METABOLIC PANEL WITH GFR
BUN/Creatinine Ratio: 16 (ref 10–24)
BUN: 16 mg/dL (ref 8–27)
CO2: 25 mmol/L (ref 20–29)
Calcium: 9.5 mg/dL (ref 8.6–10.2)
Chloride: 98 mmol/L (ref 96–106)
Creatinine, Ser: 1.01 mg/dL (ref 0.76–1.27)
Glucose: 117 mg/dL — ABNORMAL HIGH (ref 70–99)
Potassium: 3.4 mmol/L — ABNORMAL LOW (ref 3.5–5.2)
Sodium: 139 mmol/L (ref 134–144)
eGFR: 74 mL/min/1.73 (ref >59)

## 2024-10-20 ENCOUNTER — Telehealth (HOSPITAL_BASED_OUTPATIENT_CLINIC_OR_DEPARTMENT_OTHER): Payer: Self-pay | Admitting: Emergency Medicine

## 2024-10-20 ENCOUNTER — Telehealth (HOSPITAL_BASED_OUTPATIENT_CLINIC_OR_DEPARTMENT_OTHER): Payer: Self-pay | Admitting: *Deleted

## 2024-10-20 ENCOUNTER — Encounter (HOSPITAL_COMMUNITY): Payer: Self-pay

## 2024-10-20 NOTE — Telephone Encounter (Signed)
 Pt was seen on 11/10 in the ED for rigors.  It was suspected that he had a viral infection. Was cultured and dc'd home. Blood cultures were positive for gram-positive rods in 1 of 4 bottles. Called and he states he is doing better but still has a cough.  No fevers or chills currently.  Was seen on 11/11 and 11/12 for outpatient appointments and had normal vital signs without fever.  Explained that while often times this is a contaminated could represent a bloodstream infection.  We had a long discussion about next steps and if she should come back to the emergency department or monitor symptoms at home and the patient is preferring to continue observing his symptoms at home at this point in time since he is feeling so much better.  Counseled him to come back to the emergency department immediately should his symptoms worsen at all or he develop fevers or chills again.  Will follow-up on the final culture results.

## 2024-10-21 ENCOUNTER — Encounter: Payer: Self-pay | Admitting: Family Medicine

## 2024-10-21 DIAGNOSIS — R5381 Other malaise: Secondary | ICD-10-CM | POA: Insufficient documentation

## 2024-10-21 DIAGNOSIS — R509 Fever, unspecified: Secondary | ICD-10-CM | POA: Insufficient documentation

## 2024-10-21 DIAGNOSIS — R7989 Other specified abnormal findings of blood chemistry: Secondary | ICD-10-CM | POA: Insufficient documentation

## 2024-10-21 LAB — CULTURE, BLOOD (ROUTINE X 2)
Culture: NO GROWTH
Special Requests: ADEQUATE

## 2024-10-21 NOTE — Assessment & Plan Note (Addendum)
 Acute febrile illness with myalgias and weakness since Saturday. Symptoms include shaking, chills, sweating, and weakness. COVID and RSV tests negative. Possible relation to recent flu vaccination. Check for RMSF and Lyme disease.  Treat presumptively with doxycycline .  Orders:   doxycycline  (VIBRA -TABS) 100 MG tablet; Take 1 tablet (100 mg total) by mouth 2 (two) times daily.   Lyme Disease Serology w/Reflex   Spotted Fever Group Antibodies

## 2024-10-21 NOTE — Assessment & Plan Note (Addendum)
 Likely false positive due to valvular disease in the setting of no chest pain and troponin trending down.

## 2024-10-21 NOTE — Assessment & Plan Note (Addendum)
--   Likely viral illness  --

## 2024-10-22 LAB — CULTURE, BLOOD (ROUTINE X 2): Special Requests: ADEQUATE

## 2024-10-23 ENCOUNTER — Telehealth (HOSPITAL_BASED_OUTPATIENT_CLINIC_OR_DEPARTMENT_OTHER): Payer: Self-pay | Admitting: *Deleted

## 2024-10-23 ENCOUNTER — Other Ambulatory Visit (HOSPITAL_BASED_OUTPATIENT_CLINIC_OR_DEPARTMENT_OTHER): Payer: Self-pay

## 2024-10-23 DIAGNOSIS — K219 Gastro-esophageal reflux disease without esophagitis: Secondary | ICD-10-CM | POA: Diagnosis not present

## 2024-10-23 MED ORDER — OMEPRAZOLE 20 MG PO CPDR
20.0000 mg | DELAYED_RELEASE_CAPSULE | ORAL | 4 refills | Status: AC
  Filled 2024-12-09: qty 90, 90d supply, fill #0

## 2024-10-23 NOTE — Telephone Encounter (Signed)
 Post ED Visit - Positive Culture Follow-up  Culture report reviewed by antimicrobial stewardship pharmacist: [facility] [x]  [place], [place].D. []  Venetia Gully, Pharm.D., BCPS AQ-ID []  Garrel Crews, Pharm.D., BCPS []  Almarie Lunger, [address].D., BCPS []  [place], [place].D., BCPS, AAHIVP []  Rosaline Bihari, Pharm.D., BCPS, AAHIVP []  Vernell Meier, PharmD, BCPS []  Latanya Hint, PharmD, BCPS []  Donald Medley, PharmD, BCPS []  Rocky Bold, PharmD []  Dorothyann Alert, PharmD, BCPS []  Morene Babe, PharmD  [facility] []  Rosaline Edison, PharmD []  Romona Bliss, PharmD []  Dolphus Roller, PharmD []  Veva Seip, Rph []  Vernell Daunt) Leonce, PharmD []  Eva Allis, PharmD []  Rosaline Millet, PharmD []  Iantha Batch, PharmD []  Arvin Gauss, PharmD []  Wanda Hasting, PharmD []  Ronal Rav, PharmD []  Rocky Slade, PharmD []  Bard Jeans, PharmD   Positive blood culture EDP already called to follow up w/ pt and gave him return precautions--see chart note.  No further patient follow-up is required at this time.  Lorita Barnie Pereyra 10/23/2024, 11:29 AM

## 2024-10-24 ENCOUNTER — Ambulatory Visit (HOSPITAL_BASED_OUTPATIENT_CLINIC_OR_DEPARTMENT_OTHER)
Admission: RE | Admit: 2024-10-24 | Discharge: 2024-10-24 | Disposition: A | Discharge status: Home / Self Care | Source: Ambulatory Visit | Attending: Cardiology | Admitting: Cardiology

## 2024-10-24 DIAGNOSIS — E782 Mixed hyperlipidemia: Secondary | ICD-10-CM | POA: Insufficient documentation

## 2024-10-24 DIAGNOSIS — I350 Nonrheumatic aortic (valve) stenosis: Secondary | ICD-10-CM | POA: Insufficient documentation

## 2024-10-24 DIAGNOSIS — I10 Essential (primary) hypertension: Secondary | ICD-10-CM | POA: Insufficient documentation

## 2024-10-24 DIAGNOSIS — R7989 Other specified abnormal findings of blood chemistry: Secondary | ICD-10-CM | POA: Insufficient documentation

## 2024-10-24 MED ORDER — NITROGLYCERIN 0.4 MG SL SUBL
0.8000 mg | SUBLINGUAL_TABLET | Freq: Once | SUBLINGUAL | Status: AC
  Administered 2024-10-24: 0.8 mg via SUBLINGUAL

## 2024-10-24 MED ORDER — IOHEXOL 350 MG/ML SOLN
100.0000 mL | Freq: Once | INTRAVENOUS | Status: AC | PRN
  Administered 2024-10-24: 95 mL via INTRAVENOUS

## 2024-10-25 ENCOUNTER — Other Ambulatory Visit: Payer: Self-pay | Admitting: Cardiology

## 2024-10-25 ENCOUNTER — Ambulatory Visit (HOSPITAL_BASED_OUTPATIENT_CLINIC_OR_DEPARTMENT_OTHER)
Admission: RE | Admit: 2024-10-25 | Discharge: 2024-10-25 | Disposition: A | Discharge status: Home / Self Care | Source: Ambulatory Visit | Attending: Cardiology | Admitting: Cardiology

## 2024-10-25 DIAGNOSIS — R931 Abnormal findings on diagnostic imaging of heart and coronary circulation: Secondary | ICD-10-CM

## 2024-10-25 NOTE — Progress Notes (Signed)
 FFR Order

## 2024-10-26 ENCOUNTER — Ambulatory Visit: Payer: Self-pay | Admitting: Cardiology

## 2024-10-26 MED ORDER — ASPIRIN 81 MG PO TBEC
81.0000 mg | DELAYED_RELEASE_TABLET | Freq: Every day | ORAL | Status: AC
Start: 2024-10-26 — End: ?

## 2024-11-12 ENCOUNTER — Encounter: Payer: Self-pay | Admitting: Family Medicine

## 2024-11-28 ENCOUNTER — Ambulatory Visit: Admitting: Cardiology

## 2024-12-01 ENCOUNTER — Encounter: Payer: Self-pay | Admitting: Physician Assistant

## 2024-12-01 ENCOUNTER — Ambulatory Visit: Payer: Self-pay

## 2024-12-01 ENCOUNTER — Other Ambulatory Visit (HOSPITAL_BASED_OUTPATIENT_CLINIC_OR_DEPARTMENT_OTHER): Payer: Self-pay

## 2024-12-01 ENCOUNTER — Ambulatory Visit (INDEPENDENT_AMBULATORY_CARE_PROVIDER_SITE_OTHER): Admitting: Physician Assistant

## 2024-12-01 VITALS — BP 128/68 | HR 91 | Temp 97.8°F | Ht 71.0 in | Wt 231.7 lb

## 2024-12-01 DIAGNOSIS — B9689 Other specified bacterial agents as the cause of diseases classified elsewhere: Secondary | ICD-10-CM | POA: Diagnosis not present

## 2024-12-01 DIAGNOSIS — J329 Chronic sinusitis, unspecified: Secondary | ICD-10-CM

## 2024-12-01 MED ORDER — DOXYCYCLINE HYCLATE 100 MG PO TABS
100.0000 mg | ORAL_TABLET | Freq: Two times a day (BID) | ORAL | 0 refills | Status: AC
  Filled 2024-12-01: qty 14, 7d supply, fill #0

## 2024-12-01 NOTE — Telephone Encounter (Signed)
 FYI Only or Action Required?: FYI only for provider: appointment scheduled on 12/01/2024 at 11:20am with Nola Angles PA.  Patient was last seen in primary care on 10/17/2024 by Sherre Clapper, MD.  Called Nurse Triage reporting Otalgia.  Symptoms began ear pressure two days ago sinus .  Interventions attempted: OTC medications: sinus medicine and tylenol  and ibuprofen.  Symptoms are: gradually worsening.  Triage Disposition: See Physician Within 24 Hours  Patient/caregiver understands and will follow disposition?: Yes           Copied from CRM #[id number]. Topic: Clinical - Red Word Triage >> Dec 01, 2024  8:32 AM [place] MATSU wrote: Red Word that prompted transfer to Nurse Triage: pain in the left ear due to sinus per patient it clogged up since yesterday and sinus pain and pressure for a couple weeks. Per patient it is not improving. Reason for Disposition  Earache  (Exceptions: Brief ear pain of lasting less than 60 minutes, or earache occurring during air travel.)  Answer Assessment - Initial Assessment Questions Sinus medicine & tylenol  & ibuprofen  1. LOCATION: Which ear is involved?       Left 2. SENSATION: Describe how the ear feels. (e.g., stuffy, full, plugged).      pressure 3. ONSET:  When did the ear symptoms start?       ---- 4. PAIN: Do you also have an earache? If Yes, ask: How bad is it? (Scale 0-10; none, mild, moderate or severe)     pressure 5. CAUSE: What do you think is causing the ear congestion? (e.g., common cold, nasal allergies, recent flight, recent snorkeling)     ------- 6. OTHER SYMPTOMS: Do you have any other symptoms? (e.g., ear drainage, hay fever symptoms such as sneezing or a clear nasal discharge; cold symptoms such as a cough or runny nose)     -----  Answer Assessment - Initial Assessment Questions Left ear pressure/discomfort started yesterday Sinus congestion x two weeks Patient has been taking sinus medicine,  tylenol , ibuprofen   Patient is advised to call us  back if anything changes or with any further questions/concerns. Patient is advised that if anything worsens to go to the Emergency Room. Patient verbalized understanding.  Protocols used: Ear - Congestion-A-AH, Earache-A-AH

## 2024-12-01 NOTE — Progress Notes (Unsigned)
 "  Acute Office Visit  Subjective:    Patient ID: Jesse [name]., male    DOB: 1940/12/10, 83 y.o.   MRN: [id number]  Chief Complaint  Patient presents with   Left ear pain/sinus pressure    HPI: Patient is in today for sinus congestion and ear pain  Discussed the use of AI scribe software for clinical note transcription with the patient, who gave verbal consent to proceed.  History of Present Illness Jesse [name]. is an 83 year old male who presents with sinus pressure and ear pain.  He has been experiencing sinus pressure and ear pain for approximately three weeks, which began after blowing leaves in his yard and have worsened since then. He describes the sensation in his ear as feeling 'full' and 'like in a barrel', with a significant decrease in hearing, requiring him to turn his hearing aid up high.  No ear drainage but there is nasal drainage. No fever, but he experienced night sweats last night, which was a new occurrence. No severe cough, and the symptoms are primarily in his sinuses and ear. No one else around him has been sick.  He has not found relief from using saline water or other sinus treatments, as the symptoms tend to subside briefly and then return. He has not taken his temperature but has not felt feverish aside from the sweating episode.  He has an allergy to penicillin.      Past Medical History:  Diagnosis Date   Acute pulmonary embolism without acute cor pulmonale (HCC) 09/02/2020   Allergic rhinitis due to allergen 10/14/2021   Allergy ??   Arthritis    Cataract ??   Class 1 obesity due to excess calories with serious comorbidity and body mass index (BMI) of 32.0 to 32.9 in adult 06/20/2022   Degenerative lumbar spinal stenosis    Dysfunction of right eustachian tube 07/19/2024   Fatty liver    GERD (gastroesophageal reflux disease)    Headache    Heart murmur    History of kidney stones    History of SCC (squamous cell carcinoma) of  skin    Hyperlipidemia    Hypertension    Hypokalemia 07/19/2024   Mastodynia    Migraine without aura and without status migrainosus, not intractable    Neuritis or radiculitis due to rupture of lumbar intervertebral disc 07/15/2018   Osteoarthritis of left glenohumeral joint 11/05/2021   PE (pulmonary thromboembolism) (HCC)    Pre-diabetes    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   Radiculopathy of thoracolumbar region 07/15/2018   Radiculopathy of thoracolumbar region 07/15/2018   S/P lumbar fusion 07/13/2018    Past Surgical History:  Procedure Laterality Date   ANTERIOR LATERAL LUMBAR FUSION WITH PERCUTANEOUS SCREW 2 LEVEL N/A 07/13/2018   Procedure: ANTERIOR LATERAL LUMBAR FUSION L2-4;  Surgeon: Burnetta Aures, MD;  Location: [facility] OR;  Service: Orthopedics;  Laterality: N/A;  4 hrs   CHOLECYSTECTOMY  1999   GALLBLADDER SURGERY  2016   JOINT REPLACEMENT  2019   NECK SURGERY  1995   Ruptured disc repair   REVERSE SHOULDER ARTHROPLASTY Left 08/06/2022   Procedure: REVERSE SHOULDER ARTHROPLASTY;  Surgeon: Melita Drivers, MD;  Location: [facility];  Service: Orthopedics;  Laterality: Left;    scc excision     left cheek   SHOULDER SURGERY Left 2005   SHOULDER SURGERY Right 2016   SPINE SURGERY     THROAT SURGERY  stretching    Family History  Problem Relation Age of Onset   Hypertension Mother    Cancer Father    Heart disease Sister    Diabetes Brother    Kidney disease Brother    Obesity Brother    Kidney disease Sister    Migraines Neg Hx     Social History   Socioeconomic History   Marital status: Married    Spouse name: Dagoberto   Number of children: 4   Years of education: 12+   Highest education level: 12th grade  Occupational History   Occupation: Retired Visual Merchandiser  Tobacco Use   Smoking status: Former    Current packs/day: 0.00    Average packs/day: 1 pack/day for 10.0 years (10.0 ttl pk-yrs)    Types: Cigarettes, Pipe, Cigars     Start date: 65    Quit date: 1980    Years since quitting: 46.0   Smokeless tobacco: Never   Tobacco comments:    quit at least 43 years ago   Vaping Use   Vaping status: Never Used  Substance and Sexual Activity   Alcohol use: No   Drug use: No   Sexual activity: Not Currently  Other Topics Concern   Not on file  Social History Narrative   Lives with wife   Caffeine use: Drinks decaf coffee and tea   Writes right-handed, eats and shaves left-handed   Social Drivers of Health   Tobacco Use: Medium Risk (12/01/2024)   Patient History    Smoking Tobacco Use: Former    Smokeless Tobacco Use: Never    Passive Exposure: Not on Actuary Strain: Low Risk (06/06/2024)   Overall Financial Resource Strain (CARDIA)    Difficulty of Paying Living Expenses: Not very hard  Food Insecurity: No Food Insecurity (06/06/2024)   Epic    Worried About Radiation Protection Practitioner of Food in the Last Year: Never true    Ran Out of Food in the Last Year: Never true  Transportation Needs: No Transportation Needs (06/06/2024)   Epic    Lack of Transportation (Medical): No    Lack of Transportation (Non-Medical): No  Physical Activity: Sufficiently Active (06/06/2024)   Exercise Vital Sign    Days of Exercise per Week: 5 days    Minutes of Exercise per Session: 30 min  Stress: No Stress Concern Present (06/06/2024)   [employer] of Occupational Health - Occupational Stress Questionnaire    Feeling of Stress: Not at all  Social Connections: Socially Integrated (06/06/2024)   Social Connection and Isolation Panel    Frequency of Communication with Friends and Family: More than three times a week    Frequency of Social Gatherings with Friends and Family: Three times a week    Attends Religious Services: More than 4 times per year    Active Member of Clubs or Organizations: Yes    Attends Banker Meetings: More than 4 times per year    Marital Status: Married  Catering Manager Violence:  Not At Risk (06/08/2024)   Epic    Fear of Current or Ex-Partner: No    Emotionally Abused: No    Physically Abused: No    Sexually Abused: No  Depression (PHQ2-9): Low Risk (06/08/2024)   Depression (PHQ2-9)    PHQ-2 Score: 0  Alcohol Screen: Low Risk (06/08/2024)   Alcohol Screen    Last Alcohol Screening Score (AUDIT): 0  Housing: Low Risk (07/19/2024)   Epic    Unable to  Pay for Housing in the Last Year: No    Number of Times Moved in the Last Year: 0    Homeless in the Last Year: No  Utilities: Not At Risk (06/08/2024)   Epic    Threatened with loss of utilities: No  Health Literacy: Adequate Health Literacy (06/08/2024)   B1300 Health Literacy    Frequency of need for help with medical instructions: Never    Outpatient Medications Prior to Visit  Medication Sig Dispense Refill   aspirin  EC 81 MG tablet Take 1 tablet (81 mg total) by mouth daily. Swallow whole.     fexofenadine (ALLEGRA) 180 MG tablet Take 180 mg by mouth daily.     fluticasone  (FLONASE ) 50 MCG/ACT nasal spray Place 2 sprays into both nostrils daily as needed for allergies.     metoprolol  tartrate (LOPRESSOR ) 100 MG tablet Take 1 tablet (100 mg total) by mouth once for 1 dose. Please take this medication 2 hours before CT 1 tablet 0   Multiple Vitamins-Minerals (PRESERVISION AREDS 2 PO) Take 1 capsule by mouth in the morning and at bedtime.     omeprazole  (PRILOSEC) 20 MG capsule Take 1 capsule (20 mg total) by mouth every morning 30 minutes before breakfast. 90 capsule 4   potassium chloride  (MICRO-K ) 10 MEQ CR capsule Take 2 capsules (20 mEq total) by mouth daily. 180 capsule 1   rosuvastatin  (CRESTOR ) 10 MG tablet Take 1 tablet (10 mg total) by mouth daily. 90 tablet 1   triamcinolone  cream (KENALOG ) 0.1 % Apply to affected area topically 2 (two) times daily for 7-10 days as needed 30 g 1   triamcinolone  ointment (KENALOG ) 0.5 % Apply topically to affected area 2 (two) times daily for 2 weeks as needed 45 g 2    valsartan -hydrochlorothiazide  (DIOVAN -HCT) 320-25 MG tablet Take 1 tablet by mouth daily. 90 tablet 1   clindamycin  (CLEOCIN ) 150 MG capsule Take 4 capsules (600 mg total) by mouth 1 hour prior to dental appointment 8 capsule 0   doxycycline  (VIBRA -TABS) 100 MG tablet Take 1 tablet (100 mg total) by mouth 2 (two) times daily. 20 tablet 0   No facility-administered medications prior to visit.    Allergies[1]  Review of Systems  Constitutional:  Negative for appetite change, fatigue and fever.  HENT:  Positive for ear pain (left ear pain) and sinus pressure. Negative for congestion and sore throat.   Respiratory:  Negative for cough, chest tightness, shortness of breath and wheezing.   Cardiovascular:  Negative for chest pain and palpitations.  Gastrointestinal:  Negative for abdominal pain, constipation, diarrhea, nausea and vomiting.  Genitourinary:  Negative for dysuria and hematuria.  Musculoskeletal:  Negative for arthralgias, back pain, joint swelling and myalgias.  Skin:  Negative for rash.  Neurological:  Negative for dizziness, weakness and headaches.  Psychiatric/Behavioral:  Negative for dysphoric mood. The patient is not nervous/anxious.        Objective:        12/01/2024   11:29 AM 10/24/2024   11:25 AM 10/24/2024   11:11 AM  Vitals with BMI  Height 5' 11    Weight 231 lbs 11 oz    BMI 32.33    Systolic 128 114 887  Diastolic 68 62 67  Pulse 91  66    Orthostatic VS for the past 72 hrs (Last 3 readings):  Patient Position BP Location  12/01/24 1129 Sitting Left Arm     Physical Exam Vitals reviewed.  Constitutional:  Appearance: Normal appearance.  HENT:     Right Ear: Hearing normal. Swelling present. No middle ear effusion. Tympanic membrane is erythematous. Tympanic membrane is not bulging.     Left Ear: Decreased hearing noted. Swelling present. A middle ear effusion is present. Tympanic membrane is erythematous and bulging.  Neck:      Vascular: No carotid bruit.  Cardiovascular:     Rate and Rhythm: Normal rate and regular rhythm.     Heart sounds: Normal heart sounds.  Pulmonary:     Effort: Pulmonary effort is normal.     Breath sounds: Normal breath sounds.  Abdominal:     General: Bowel sounds are normal.     Palpations: Abdomen is soft.     Tenderness: There is no abdominal tenderness.  Neurological:     Mental Status: He is alert and oriented to person, place, and time.  Psychiatric:        Mood and Affect: Mood normal.        Behavior: Behavior normal.     Health Maintenance Due  Topic Date Due   Zoster Vaccines- Shingrix (1 of 2) Never done   DTaP/Tdap/Td (2 - Td or Tdap) 07/26/2023   COVID-19 Vaccine (5 - 2025-26 season) 08/07/2024    There are no preventive care reminders to display for this patient.   No results found for: TSH Lab Results  Component Value Date   WBC 5.7 10/16/2024   HGB 14.6 10/16/2024   HCT 42.5 10/16/2024   MCV 86.2 10/16/2024   PLT 99 (L) 10/16/2024   Lab Results  Component Value Date   NA 139 10/18/2024   K 3.4 (L) 10/18/2024   CO2 25 10/18/2024   GLUCOSE 117 (H) 10/18/2024   BUN 16 10/18/2024   CREATININE 1.01 10/18/2024   BILITOT 0.8 10/16/2024   ALKPHOS 70 10/16/2024   AST 37 10/16/2024   ALT 31 10/16/2024   PROT 6.6 10/16/2024   ALBUMIN  4.1 10/16/2024   CALCIUM  9.5 10/18/2024   ANIONGAP 9 10/16/2024   EGFR 74 10/18/2024   Lab Results  Component Value Date   CHOL 95 (L) 01/17/2024   Lab Results  Component Value Date   HDL 45 01/17/2024   Lab Results  Component Value Date   LDLCALC 29 01/17/2024   Lab Results  Component Value Date   TRIG 114 01/17/2024   Lab Results  Component Value Date   CHOLHDL 2.1 01/17/2024   Lab Results  Component Value Date   HGBA1C 5.7 (A) 07/19/2024        Results for orders placed or performed in visit on 10/18/24  Basic Metabolic Panel (BMET)   Collection Time: 10/18/24 11:43 AM  Result Value Ref  Range   Glucose 117 (H) 70 - 99 mg/dL   BUN 16 8 - 27 mg/dL   Creatinine, Ser 8.98 0.76 - 1.27 mg/dL   eGFR 74 >40 fO/fpw/8.26   BUN/Creatinine Ratio 16 10 - 24   Sodium 139 134 - 144 mmol/L   Potassium 3.4 (L) 3.5 - 5.2 mmol/L   Chloride 98 96 - 106 mmol/L   CO2 25 20 - 29 mmol/L   Calcium  9.5 8.6 - 10.2 mg/dL     Assessment & Plan:   Assessment & Plan Bacterial sinusitis Acute bacterial sinusitis with otitis media Sinus pressure and ear pain for three weeks, likely bacterial infection. Symptoms include ear fullness, decreased hearing, and sinus drainage. Allergy to penicillin noted. - Prescribed doxycycline  for 7 days, twice  daily. - Advised to start doxycycline  today with one dose, then continue with morning and night doses. - Instructed to return if symptoms worsen or do not improve. Orders:   doxycycline  (VIBRA -TABS) 100 MG tablet; Take 1 tablet (100 mg total) by mouth 2 (two) times daily.    Body mass index is 32.32 kg/m..   Meds ordered this encounter  Medications   doxycycline  (VIBRA -TABS) 100 MG tablet    Sig: Take 1 tablet (100 mg total) by mouth 2 (two) times daily.    Dispense:  14 tablet    Refill:  0    No orders of the defined types were placed in this encounter.    Follow-up: Return if symptoms worsen or fail to improve.  An After Visit Summary was printed and given to the patient.    I,Lauren M Auman,acting as a neurosurgeon for [employer], PA.,have documented all relevant documentation on the behalf of Nola Angles, PA,as directed by  Nola Angles, PA while in the presence of Nola Angles, [place].    Nola Angles, [place] [facility] [phone]     [1]  Allergies Allergen Reactions   Penicillins Rash   "

## 2024-12-09 ENCOUNTER — Other Ambulatory Visit: Payer: Self-pay | Admitting: Cardiology

## 2024-12-10 ENCOUNTER — Other Ambulatory Visit (HOSPITAL_BASED_OUTPATIENT_CLINIC_OR_DEPARTMENT_OTHER): Payer: Self-pay

## 2024-12-11 ENCOUNTER — Other Ambulatory Visit (HOSPITAL_BASED_OUTPATIENT_CLINIC_OR_DEPARTMENT_OTHER): Payer: Self-pay

## 2024-12-17 ENCOUNTER — Other Ambulatory Visit: Payer: Self-pay | Admitting: Family Medicine

## 2024-12-17 ENCOUNTER — Other Ambulatory Visit: Payer: Self-pay | Admitting: Cardiology

## 2024-12-17 MED ORDER — ROSUVASTATIN CALCIUM 10 MG PO TABS
10.0000 mg | ORAL_TABLET | Freq: Every day | ORAL | 1 refills | Status: AC
  Filled 2024-12-17: qty 90, 90d supply, fill #0

## 2024-12-18 ENCOUNTER — Other Ambulatory Visit (HOSPITAL_BASED_OUTPATIENT_CLINIC_OR_DEPARTMENT_OTHER): Payer: Self-pay

## 2025-01-08 ENCOUNTER — Other Ambulatory Visit (HOSPITAL_BASED_OUTPATIENT_CLINIC_OR_DEPARTMENT_OTHER): Payer: Self-pay

## 2025-01-08 ENCOUNTER — Other Ambulatory Visit: Payer: Self-pay | Admitting: Family Medicine

## 2025-01-08 DIAGNOSIS — E876 Hypokalemia: Secondary | ICD-10-CM

## 2025-01-08 DIAGNOSIS — I10 Essential (primary) hypertension: Secondary | ICD-10-CM

## 2025-01-08 MED ORDER — VALSARTAN-HYDROCHLOROTHIAZIDE 320-25 MG PO TABS
1.0000 | ORAL_TABLET | Freq: Every day | ORAL | 1 refills | Status: AC
  Filled 2025-01-08: qty 90, 90d supply, fill #0

## 2025-01-08 MED ORDER — POTASSIUM CHLORIDE ER 10 MEQ PO CPCR
20.0000 meq | ORAL_CAPSULE | Freq: Every day | ORAL | 1 refills | Status: AC
  Filled 2025-01-08: qty 180, 90d supply, fill #0

## 2025-01-18 ENCOUNTER — Ambulatory Visit: Admitting: Cardiology

## 2025-01-19 ENCOUNTER — Ambulatory Visit: Admitting: Family Medicine

## 2025-01-19 DIAGNOSIS — E782 Mixed hyperlipidemia: Secondary | ICD-10-CM

## 2025-01-19 DIAGNOSIS — R7303 Prediabetes: Secondary | ICD-10-CM

## 2025-06-14 ENCOUNTER — Ambulatory Visit: Admitting: Family Medicine
